# Patient Record
Sex: Female | Born: 1975 | Race: White | Hispanic: No | Marital: Married | Smoking: Never smoker
Health system: Southern US, Community
[De-identification: ages and names within clinical notes are randomized; demographics above are authoritative.]

## PROBLEM LIST (undated history)

## (undated) DIAGNOSIS — M1990 Unspecified osteoarthritis, unspecified site: Secondary | ICD-10-CM

## (undated) DIAGNOSIS — N3020 Other chronic cystitis without hematuria: Secondary | ICD-10-CM

## (undated) DIAGNOSIS — D649 Anemia, unspecified: Secondary | ICD-10-CM

## (undated) DIAGNOSIS — J42 Unspecified chronic bronchitis: Secondary | ICD-10-CM

## (undated) DIAGNOSIS — Z87440 Personal history of urinary (tract) infections: Secondary | ICD-10-CM

## (undated) DIAGNOSIS — T7840XA Allergy, unspecified, initial encounter: Secondary | ICD-10-CM

## (undated) DIAGNOSIS — R87619 Unspecified abnormal cytological findings in specimens from cervix uteri: Secondary | ICD-10-CM

## (undated) DIAGNOSIS — N200 Calculus of kidney: Secondary | ICD-10-CM

## (undated) DIAGNOSIS — Z87442 Personal history of urinary calculi: Secondary | ICD-10-CM

## (undated) DIAGNOSIS — J301 Allergic rhinitis due to pollen: Secondary | ICD-10-CM

## (undated) DIAGNOSIS — J45909 Unspecified asthma, uncomplicated: Secondary | ICD-10-CM

## (undated) DIAGNOSIS — F419 Anxiety disorder, unspecified: Secondary | ICD-10-CM

## (undated) DIAGNOSIS — F329 Major depressive disorder, single episode, unspecified: Secondary | ICD-10-CM

## (undated) DIAGNOSIS — J349 Unspecified disorder of nose and nasal sinuses: Secondary | ICD-10-CM

## (undated) DIAGNOSIS — I10 Essential (primary) hypertension: Secondary | ICD-10-CM

## (undated) DIAGNOSIS — R0609 Other forms of dyspnea: Secondary | ICD-10-CM

## (undated) DIAGNOSIS — Z8619 Personal history of other infectious and parasitic diseases: Secondary | ICD-10-CM

## (undated) DIAGNOSIS — F32A Depression, unspecified: Secondary | ICD-10-CM

## (undated) DIAGNOSIS — E7800 Pure hypercholesterolemia, unspecified: Secondary | ICD-10-CM

## (undated) DIAGNOSIS — R0600 Dyspnea, unspecified: Secondary | ICD-10-CM

## (undated) DIAGNOSIS — N941 Unspecified dyspareunia: Secondary | ICD-10-CM

## (undated) DIAGNOSIS — Z8709 Personal history of other diseases of the respiratory system: Secondary | ICD-10-CM

## (undated) HISTORY — DX: Unspecified abnormal cytological findings in specimens from cervix uteri: R87.619

## (undated) HISTORY — DX: Allergy, unspecified, initial encounter: T78.40XA

## (undated) HISTORY — DX: Allergic rhinitis due to pollen: J30.1

## (undated) HISTORY — DX: Pure hypercholesterolemia, unspecified: E78.00

## (undated) HISTORY — PX: SEPTOPLASTY: SUR1290

## (undated) HISTORY — DX: Major depressive disorder, single episode, unspecified: F32.9

## (undated) HISTORY — DX: Essential (primary) hypertension: I10

## (undated) HISTORY — DX: Depression, unspecified: F32.A

## (undated) HISTORY — DX: Unspecified disorder of nose and nasal sinuses: J34.9

---
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
## 1993-05-30 HISTORY — PX: KNEE ARTHROSCOPY: SHX127

## 1998-04-20 ENCOUNTER — Other Ambulatory Visit: Admission: RE | Admit: 1998-04-20 | Discharge: 1998-04-20 | Payer: Self-pay | Admitting: *Deleted

## 1999-02-24 ENCOUNTER — Other Ambulatory Visit: Admission: RE | Admit: 1999-02-24 | Discharge: 1999-02-24 | Payer: Self-pay | Admitting: *Deleted

## 2000-02-24 ENCOUNTER — Other Ambulatory Visit: Admission: RE | Admit: 2000-02-24 | Discharge: 2000-02-24 | Payer: Self-pay | Admitting: *Deleted

## 2001-03-06 ENCOUNTER — Other Ambulatory Visit: Admission: RE | Admit: 2001-03-06 | Discharge: 2001-03-06 | Payer: Self-pay | Admitting: *Deleted

## 2002-04-30 ENCOUNTER — Other Ambulatory Visit: Admission: RE | Admit: 2002-04-30 | Discharge: 2002-04-30 | Payer: Self-pay | Admitting: Obstetrics and Gynecology

## 2002-11-20 ENCOUNTER — Inpatient Hospital Stay (HOSPITAL_COMMUNITY): Admission: RE | Admit: 2002-11-20 | Discharge: 2002-11-23 | Payer: Self-pay | Admitting: Obstetrics and Gynecology

## 2002-11-24 ENCOUNTER — Encounter: Admission: RE | Admit: 2002-11-24 | Discharge: 2002-12-24 | Payer: Self-pay | Admitting: Obstetrics and Gynecology

## 2003-04-22 ENCOUNTER — Other Ambulatory Visit: Admission: RE | Admit: 2003-04-22 | Discharge: 2003-04-22 | Payer: Self-pay | Admitting: *Deleted

## 2004-04-14 ENCOUNTER — Ambulatory Visit: Payer: Self-pay | Admitting: Family Medicine

## 2004-10-27 ENCOUNTER — Ambulatory Visit: Payer: Self-pay | Admitting: Internal Medicine

## 2004-12-29 ENCOUNTER — Ambulatory Visit: Payer: Self-pay | Admitting: Family Medicine

## 2005-02-21 ENCOUNTER — Ambulatory Visit: Payer: Self-pay | Admitting: Family Medicine

## 2005-03-07 ENCOUNTER — Other Ambulatory Visit: Admission: RE | Admit: 2005-03-07 | Discharge: 2005-03-07 | Payer: Self-pay | Admitting: *Deleted

## 2005-04-01 ENCOUNTER — Ambulatory Visit: Payer: Self-pay | Admitting: Family Medicine

## 2005-07-18 ENCOUNTER — Ambulatory Visit (HOSPITAL_COMMUNITY): Admission: RE | Admit: 2005-07-18 | Discharge: 2005-07-18 | Payer: Self-pay | Admitting: Orthopedic Surgery

## 2006-03-15 ENCOUNTER — Other Ambulatory Visit: Admission: RE | Admit: 2006-03-15 | Discharge: 2006-03-15 | Payer: Self-pay | Admitting: *Deleted

## 2006-05-01 ENCOUNTER — Ambulatory Visit: Payer: Self-pay | Admitting: Family Medicine

## 2006-05-19 ENCOUNTER — Ambulatory Visit: Payer: Self-pay | Admitting: Family Medicine

## 2006-06-01 ENCOUNTER — Ambulatory Visit: Payer: Self-pay | Admitting: Allergy and Immunology

## 2006-06-18 ENCOUNTER — Emergency Department (HOSPITAL_COMMUNITY): Admission: EM | Admit: 2006-06-18 | Discharge: 2006-06-18 | Payer: Self-pay | Admitting: Emergency Medicine

## 2006-08-24 ENCOUNTER — Ambulatory Visit: Payer: Self-pay | Admitting: Family Medicine

## 2007-01-20 ENCOUNTER — Emergency Department (HOSPITAL_COMMUNITY): Admission: EM | Admit: 2007-01-20 | Discharge: 2007-01-20 | Payer: Self-pay | Admitting: Emergency Medicine

## 2007-03-14 ENCOUNTER — Other Ambulatory Visit: Admission: RE | Admit: 2007-03-14 | Discharge: 2007-03-14 | Payer: Self-pay | Admitting: *Deleted

## 2007-03-16 ENCOUNTER — Ambulatory Visit: Payer: Self-pay | Admitting: Internal Medicine

## 2007-07-09 ENCOUNTER — Encounter (INDEPENDENT_AMBULATORY_CARE_PROVIDER_SITE_OTHER): Payer: Self-pay | Admitting: Internal Medicine

## 2008-05-26 ENCOUNTER — Other Ambulatory Visit: Admission: RE | Admit: 2008-05-26 | Discharge: 2008-05-26 | Payer: Self-pay | Admitting: Gynecology

## 2009-03-16 ENCOUNTER — Ambulatory Visit: Payer: Self-pay | Admitting: Family Medicine

## 2009-03-16 DIAGNOSIS — J209 Acute bronchitis, unspecified: Secondary | ICD-10-CM

## 2009-03-31 ENCOUNTER — Emergency Department (HOSPITAL_COMMUNITY): Admission: EM | Admit: 2009-03-31 | Discharge: 2009-03-31 | Payer: Self-pay | Admitting: Family Medicine

## 2009-05-19 ENCOUNTER — Ambulatory Visit: Payer: Self-pay | Admitting: Family Medicine

## 2009-05-19 DIAGNOSIS — J069 Acute upper respiratory infection, unspecified: Secondary | ICD-10-CM | POA: Insufficient documentation

## 2009-07-06 ENCOUNTER — Ambulatory Visit: Payer: Self-pay | Admitting: Family Medicine

## 2009-07-06 DIAGNOSIS — J309 Allergic rhinitis, unspecified: Secondary | ICD-10-CM

## 2009-07-06 DIAGNOSIS — J019 Acute sinusitis, unspecified: Secondary | ICD-10-CM

## 2009-07-15 ENCOUNTER — Ambulatory Visit: Payer: Self-pay | Admitting: Family Medicine

## 2009-07-15 DIAGNOSIS — F4323 Adjustment disorder with mixed anxiety and depressed mood: Secondary | ICD-10-CM

## 2009-07-16 ENCOUNTER — Telehealth: Payer: Self-pay | Admitting: Family Medicine

## 2009-07-17 ENCOUNTER — Telehealth: Payer: Self-pay | Admitting: Family Medicine

## 2009-07-27 ENCOUNTER — Telehealth: Payer: Self-pay | Admitting: Family Medicine

## 2009-08-12 ENCOUNTER — Ambulatory Visit: Payer: Self-pay | Admitting: Family Medicine

## 2009-08-18 ENCOUNTER — Ambulatory Visit: Payer: Self-pay | Admitting: Family Medicine

## 2009-08-24 ENCOUNTER — Ambulatory Visit: Payer: Self-pay | Admitting: Family Medicine

## 2009-08-27 ENCOUNTER — Ambulatory Visit: Payer: Self-pay | Admitting: Family Medicine

## 2009-08-27 DIAGNOSIS — J029 Acute pharyngitis, unspecified: Secondary | ICD-10-CM

## 2009-08-27 LAB — CONVERTED CEMR LAB: Rapid Strep: NEGATIVE

## 2009-09-01 ENCOUNTER — Telehealth: Payer: Self-pay | Admitting: Family Medicine

## 2009-09-08 ENCOUNTER — Ambulatory Visit: Payer: Self-pay | Admitting: Family Medicine

## 2009-09-09 ENCOUNTER — Telehealth: Payer: Self-pay | Admitting: Family Medicine

## 2009-09-23 ENCOUNTER — Ambulatory Visit: Payer: Self-pay | Admitting: Family Medicine

## 2009-09-23 DIAGNOSIS — T148XXA Other injury of unspecified body region, initial encounter: Secondary | ICD-10-CM | POA: Insufficient documentation

## 2009-09-23 DIAGNOSIS — K219 Gastro-esophageal reflux disease without esophagitis: Secondary | ICD-10-CM | POA: Insufficient documentation

## 2009-09-23 LAB — CONVERTED CEMR LAB
Basophils Absolute: 0 10*3/uL (ref 0.0–0.1)
Eosinophils Absolute: 0.1 10*3/uL (ref 0.0–0.7)
Eosinophils Relative: 1 % (ref 0.0–5.0)
Lymphocytes Relative: 24.4 % (ref 12.0–46.0)
Lymphs Abs: 1.6 10*3/uL (ref 0.7–4.0)
MCHC: 35.3 g/dL (ref 30.0–36.0)
Monocytes Relative: 8 % (ref 3.0–12.0)
RBC: 4.22 M/uL (ref 3.87–5.11)

## 2009-10-27 ENCOUNTER — Encounter: Admission: RE | Admit: 2009-10-27 | Discharge: 2009-10-27 | Payer: Self-pay | Admitting: Pulmonary Disease

## 2010-01-08 ENCOUNTER — Ambulatory Visit: Payer: Self-pay | Admitting: Family Medicine

## 2010-01-11 LAB — CONVERTED CEMR LAB
Basophils Absolute: 0 10*3/uL (ref 0.0–0.1)
Bilirubin, Direct: 0.1 mg/dL (ref 0.0–0.3)
Eosinophils Absolute: 0 10*3/uL (ref 0.0–0.7)
Hemoglobin: 13.5 g/dL (ref 12.0–15.0)
Lymphocytes Relative: 27.4 % (ref 12.0–46.0)
MCHC: 34.9 g/dL (ref 30.0–36.0)
MCV: 90 fL (ref 78.0–100.0)
Neutrophils Relative %: 63.9 % (ref 43.0–77.0)
Platelets: 250 10*3/uL (ref 150.0–400.0)
Saturation Ratios: 47.7 % (ref 20.0–50.0)
Total Bilirubin: 0.7 mg/dL (ref 0.3–1.2)
WBC: 5.9 10*3/uL (ref 4.5–10.5)

## 2010-06-29 NOTE — Assessment & Plan Note (Signed)
Summary: 1 M F/U DLO   Vital Signs:  Patient profile:   35 year old female Height:      66 inches Weight:      130 pounds BMI:     21.06 Temp:     99.1 degrees F oral Pulse rate:   92 / minute Pulse rhythm:   regular BP sitting:   142 / 94  (left arm) Cuff size:   regular  Vitals Entered By: Delilah Shan CMA Duncan Dull) (September 08, 2009 9:42 AM) CC: 1 month follow up   History of Present Illness: 35 yo here for follow up depression.   Last month,  I saw her for worsening depression related to her stressors at work.  We increased her Wellbutrin to 300 mg daily from 150 mg daily.  Since then she started seeing a psychiatrist, Dr. Cardell Peach.  She like him very much. He switched her to 300 mg XL to decrease side effects of jitteriness, yawning, had nausea and diarrhea which has helped  Depressive symptoms are much improved.   No SI or HI. Husband very supportive.  She is planning on returning to work on May 2nd. Working on strategies for when she returns to work to minimize stressors with her psychiatrist. She is actually looking forward to returning to work.  Current Medications (verified): 1)  Advair Diskus 100-50 Mcg/dose Aepb (Fluticasone-Salmeterol) .... Inhale One Puff in The Morning and Inhale One Puff in The Evening 2)  Nasacort Aq 55 Mcg/act Aers (Triamcinolone Acetonide(Nasal)) .... Two Puffs Once Daily 3)  Astepro 0.15 % Soln (Azelastine Hcl) .... Two Puffs Once Daily 4)  Zyrtec Allergy 10 Mg Tabs (Cetirizine Hcl) .... Take One Tablet Daily 5)  Multivitamins   Tabs (Multiple Vitamin) .... Takes One Daily 6)  Chewable Calcium 500-200-40 Mg-Unt-Mcg Chew (Calcium-Vitamin D-Vitamin K) .... Otc As Directed. 7)  Chewable Vitamin C 250 Mg Chew (Ascorbic Acid) .... Otc As Directed. 8)  Ambien Cr 6.25 Mg  Tbcr (Zolpidem Tartrate) .Marland Kitchen.. 1 By Mouth At [facility] Needed 9)  Wellbutrin Xl 300 Mg Xr24h-Tab (Bupropion Hcl) .... Take 1 Tablet By Mouth Once A Day  Allergies: 1)  !  Amoxicillin 2)  ! Codeine 3)  ! Erythromycin  Review of Systems      See HPI Psych:  Denies sense of great danger, suicidal thoughts/plans, thoughts of violence, unusual visions or sounds, and thoughts /plans of harming others.  Physical Exam  General:  alert, NAD. Psych:  Oriented X3, good eye contact, and depressed affect.looks great!!   Impression & Recommendations:  Problem # 1:  ADJ DISORDER WITH MIXED ANXIETY & DEPRESSED MOOD (ICD-309.28) Assessment Improved Time spent with patient 25 minutes, more than 50% of this time was spent discussing her depression and anxiety. She is doing much better.  Continue Wellbutrin at current dose and continue therapy with her psychiatrist.  She is going to transfer medication management to him.  Complete Medication List: 1)  Advair Diskus 100-50 Mcg/dose Aepb (Fluticasone-salmeterol) .... Inhale one puff in the morning and inhale one puff in the evening 2)  Nasacort Aq 55 Mcg/act Aers (Triamcinolone acetonide(nasal)) .... Two puffs once daily 3)  Astepro 0.15 % Soln (Azelastine hcl) .... Two puffs once daily 4)  Zyrtec Allergy 10 Mg Tabs (Cetirizine hcl) .... Take one tablet daily 5)  Multivitamins Tabs (Multiple vitamin) .... Takes one daily 6)  Chewable Calcium 500-200-40 Mg-unt-mcg Chew (Calcium-vitamin d-vitamin k) .... Otc as directed. 7)  Chewable Vitamin C 250 Mg Chew (  Ascorbic acid) .... Otc as directed. 8)  Ambien Cr 6.25 Mg Tbcr (Zolpidem tartrate) .Marland Kitchen.. 1 by mouth at bedtimeas needed 9)  Wellbutrin Xl 300 Mg Xr24h-tab (Bupropion hcl) .... Take 1 tablet by mouth once a day Prescriptions: AMBIEN CR 6.25 MG  TBCR (ZOLPIDEM TARTRATE) 1 by mouth at bedtimeas needed  #30 x 0   Entered and Authorized by:   Ruthe Mannan MD   Signed by:   Ruthe Mannan MD on 09/08/2009   Method used:   Print then Give to Patient   RxID:   [id number]   Current Allergies (reviewed today): ! AMOXICILLIN ! CODEINE ! ERYTHROMYCIN

## 2010-06-29 NOTE — Assessment & Plan Note (Signed)
Summary: ST/CLE   Vital Signs:  Patient profile:   35 year old female Height:      66 inches Weight:      130.38 pounds BMI:     21.12 Temp:     98.9 degrees F oral Pulse rate:   96 / minute Pulse rhythm:   regular BP sitting:   132 / 84  (left arm) Cuff size:   regular  Vitals Entered By: Delilah Shan CMA Duncan Dull) (August 27, 2009 10:44 AM) CC: ST   History of Present Illness: 35 yo with 1 day of sore throat, right>left. Also has runny nose, cough, sinus pressure. No fevers or chills. No n/v/d. No rashes.  Son diagnosed with strep throat last week.  Current Medications (verified): 1)  Advair Diskus 100-50 Mcg/dose Aepb (Fluticasone-Salmeterol) .... Inhale One Puff in The Morning and Inhale One Puff in The Evening 2)  Nasacort Aq 55 Mcg/act Aers (Triamcinolone Acetonide(Nasal)) .... Two Puffs Once Daily 3)  Astepro 0.15 % Soln (Azelastine Hcl) .... Two Puffs Once Daily 4)  Zyrtec Allergy 10 Mg Tabs (Cetirizine Hcl) .... Take One Tablet Daily 5)  Multivitamins   Tabs (Multiple Vitamin) .... Takes One Daily 6)  Chewable Calcium 500-200-40 Mg-Unt-Mcg Chew (Calcium-Vitamin D-Vitamin K) .... Otc As Directed. 7)  Chewable Vitamin C 250 Mg Chew (Ascorbic Acid) .... Otc As Directed. 8)  Ambien Cr 6.25 Mg  Tbcr (Zolpidem Tartrate) .Marland Kitchen.. 1 By Mouth At [facility] Needed 9)  Wellbutrin Sr 150 Mg Xr12h-Tab (Bupropion Hcl) .... 2 Tab By Mouth Every Morning.  Allergies: 1)  ! Amoxicillin 2)  ! Codeine 3)  ! Erythromycin  Review of Systems      See HPI General:  Denies chills and fever. ENT:  Complains of nasal congestion, sinus pressure, and sore throat; denies difficulty swallowing. Resp:  Complains of cough; denies shortness of breath, sputum productive, and wheezing. GI:  Denies abdominal pain, diarrhea, nausea, and vomiting.  Physical Exam  General:  alert, NAD. Ears:  R ear normal and L ear normal.   Nose:  mucosal erythema.   Mouth:  pharyngeal erythema.   no exudates,  mild tonsillar hypertrophy. Lungs:  Normal respiratory effort, chest expands symmetrically. Lungs are clear to auscultation, no crackles or wheezes. Heart:  Normal rate and regular rhythm. S1 and S2 normal without gallop, murmur, click, rub or other extra sounds. Psych:  Oriented X3, good eye contact, and depressed affect. less tearful.   Impression & Recommendations:  Problem # 1:  SORE THROAT (ICD-462) Assessment New Consistent with viral URI given other symptoms and physical exam findings.  Rapid strep neg, will not send for culture because not characteristic for strep.  Advised Ibuprofen for pain.  See pt instructions. Orders: Rapid Strep ([id number])  Complete Medication List: 1)  Advair Diskus 100-50 Mcg/dose Aepb (Fluticasone-salmeterol) .... Inhale one puff in the morning and inhale one puff in the evening 2)  Nasacort Aq 55 Mcg/act Aers (Triamcinolone acetonide(nasal)) .... Two puffs once daily 3)  Astepro 0.15 % Soln (Azelastine hcl) .... Two puffs once daily 4)  Zyrtec Allergy 10 Mg Tabs (Cetirizine hcl) .... Take one tablet daily 5)  Multivitamins Tabs (Multiple vitamin) .... Takes one daily 6)  Chewable Calcium 500-200-40 Mg-unt-mcg Chew (Calcium-vitamin d-vitamin k) .... Otc as directed. 7)  Chewable Vitamin C 250 Mg Chew (Ascorbic acid) .... Otc as directed. 8)  Ambien Cr 6.25 Mg Tbcr (Zolpidem tartrate) .Marland Kitchen.. 1 by mouth at bedtimeas needed 9)  Wellbutrin Sr 150 Mg Xr12h-tab (  Bupropion hcl) .... 2 tab by mouth every morning.  Patient Instructions: 1)  Take 400-600 mg of Ibuprofen (Advil, Motrin) with food every 4-6 hours as needed  for relief of pain or comfort of fever.   Current Allergies (reviewed today): ! AMOXICILLIN ! CODEINE ! ERYTHROMYCIN  Laboratory Results   Date/Time Reported: August 27, 2009 10:51 AM   Other Tests  Rapid Strep: negative

## 2010-06-29 NOTE — Assessment & Plan Note (Signed)
Summary: ?REACTION TO NEW MEDICATION/CLE   Vital Signs:  Patient profile:   35 year old female Height:      66 inches Weight:      130.25 pounds BMI:     21.10 Temp:     99.1 degrees F oral Pulse rate:   84 / minute Pulse rhythm:   regular BP sitting:   122 / 88  (left arm) Cuff size:   regular  Vitals Entered By: Delilah Shan CMA Duncan Dull) (August 18, 2009 9:42 AM) CC: ? reaction to new medication    History of Present Illness: 35 yo here for ?side effects to Wellbutrin.  6 days ago, I saw her for worsening depression related to her stressors at work.  We increased her Wellbutrin to 300 mg daily from 150 mg daily. Since then has been jittery, yawning, had nausea and diarrhea. Had a fleeting chest discomfort a few days ago which lasted seconds and resolved, she felt it was like a palpitation. No vomiting. Depressive symptoms are about the same, less crying spells since we started Wellbutrin at lower dose.   No SI or HI. Husband very supportive. Still seeing therapist.    Current Medications (verified): 1)  Advair Diskus 100-50 Mcg/dose Aepb (Fluticasone-Salmeterol) .... Inhale One Puff in The Morning and Inhale One Puff in The Evening 2)  Nasacort Aq 55 Mcg/act Aers (Triamcinolone Acetonide(Nasal)) .... Two Puffs Once Daily 3)  Astepro 0.15 % Soln (Azelastine Hcl) .... Two Puffs Once Daily 4)  Zyrtec Allergy 10 Mg Tabs (Cetirizine Hcl) .... Take One Tablet Daily 5)  Multivitamins   Tabs (Multiple Vitamin) .... Takes One Daily 6)  Chewable Calcium 500-200-40 Mg-Unt-Mcg Chew (Calcium-Vitamin D-Vitamin K) .... Otc As Directed. 7)  Chewable Vitamin C 250 Mg Chew (Ascorbic Acid) .... Otc As Directed. 8)  Ambien Cr 6.25 Mg  Tbcr (Zolpidem Tartrate) .Marland Kitchen.. 1 By Mouth At [facility] Needed 9)  Wellbutrin Sr 150 Mg Xr12h-Tab (Bupropion Hcl) .... 2 Tab By Mouth Every Morning.  Allergies: 1)  ! Amoxicillin 2)  ! Codeine 3)  ! Erythromycin  Review of Systems      See HPI General:   Denies fever. GI:  Complains of diarrhea and nausea; denies vomiting. Psych:  Complains of depression; denies anxiety, easily angered, easily tearful, suicidal thoughts/plans, thoughts of violence, unusual visions or sounds, and thoughts /plans of harming others.  Physical Exam  General:  alert, less tearful. Lungs:  good effort,diffuse exp wheezes, no crackles. Heart:  Normal rate and regular rhythm. S1 and S2 normal without gallop, murmur, click, rub or other extra sounds. Psych:  Oriented X3, good eye contact, and depressed affect. less tearful.   Impression & Recommendations:  Problem # 1:  ADJ DISORDER WITH MIXED ANXIETY & DEPRESSED MOOD (ICD-309.28) Assessment Unchanged Time spent with patient 25 minutes, more than 50% of this time was spent counseling patient on side effects of Wellbutrin. Printed out handout from up to date showing her the possible side effects. Reassurance provided, did tell her to go immediately to ER if she does feel any cardiac or other worrisome symptoms. Pt expressed understanding.  She wants to give the higher dose another week before backing off in hopes that the side effects will wear off.  She will call me in 1-2 weeks.  Complete Medication List: 1)  Advair Diskus 100-50 Mcg/dose Aepb (Fluticasone-salmeterol) .... Inhale one puff in the morning and inhale one puff in the evening 2)  Nasacort Aq 55 Mcg/act Aers (Triamcinolone acetonide(nasal)) .Marland KitchenMarland KitchenMarland Kitchen  Two puffs once daily 3)  Astepro 0.15 % Soln (Azelastine hcl) .... Two puffs once daily 4)  Zyrtec Allergy 10 Mg Tabs (Cetirizine hcl) .... Take one tablet daily 5)  Multivitamins Tabs (Multiple vitamin) .... Takes one daily 6)  Chewable Calcium 500-200-40 Mg-unt-mcg Chew (Calcium-vitamin d-vitamin k) .... Otc as directed. 7)  Chewable Vitamin C 250 Mg Chew (Ascorbic acid) .... Otc as directed. 8)  Ambien Cr 6.25 Mg Tbcr (Zolpidem tartrate) .Marland Kitchen.. 1 by mouth at bedtimeas needed 9)  Wellbutrin Sr 150 Mg  Xr12h-tab (Bupropion hcl) .... 2 tab by mouth every morning.  Current Allergies (reviewed today): ! AMOXICILLIN ! CODEINE ! ERYTHROMYCIN

## 2010-06-29 NOTE — Assessment & Plan Note (Signed)
Summary: 30 MIN/DEPRESSION/BILLIE'S PT/CLE   Vital Signs:  Patient profile:   35 year old female Height:      66 inches Weight:      135.50 pounds BMI:     21.95 Temp:     98.7 degrees F oral Pulse rate:   88 / minute Pulse rhythm:   regular BP sitting:   142 / 90  (left arm) Cuff size:   regular  Vitals Entered By: Delilah Shan CMA (AAMA) (July 15, 2009 10:07 AM) CC: 30 min (BDB)  Depression   History of Present Illness: 35 yo here for worsening depression.  Had two episodes of depression in past, one post partum and once in college. Both were situational and improved with antidepressants and therapy.  Had been doing very well until recently.  Was promoted at work months ago, since then added pressure.  Stays up late and night worrying about her job. Difficulty falling and staying asleep.  Over last 3 weeks, cannot stop crying. Difficulty concentrating at work.  Has two children, feels like she has no energy to spend time with them. Loss of sexual interest.  Feels more irritable. No SI or HI.  Started seeing a Haematologist, [employer] manager yesterday.  Referred to him through her employer.  Likes him a lot, feels it will help her to continue therapy.  Current Medications (verified): 1)  Advair Diskus 100-50 Mcg/dose Aepb (Fluticasone-Salmeterol) .... Inhale One Puff in The Morning and Inhale One Puff in The Evening 2)  Nasacort Aq 55 Mcg/act Aers (Triamcinolone Acetonide(Nasal)) .... Two Puffs Once Daily 3)  Astepro 0.15 % Soln (Azelastine Hcl) .... Two Puffs Once Daily 4)  Zyrtec Allergy 10 Mg Tabs (Cetirizine Hcl) .... Take One Tablet Daily 5)  Multivitamins   Tabs (Multiple Vitamin) .... Takes One Daily 6)  Chewable Calcium 500-200-40 Mg-Unt-Mcg Chew (Calcium-Vitamin D-Vitamin K) .... Otc As Directed. 7)  Chewable Vitamin C 250 Mg Chew (Ascorbic Acid) .... Otc As Directed. 8)  Trazodone Hcl 50 Mg Tabs (Trazodone Hcl) .Marland Kitchen.. 1 By Mouth At Bedtime As Needed Insomnia 9)   Wellbutrin Xl 150 Mg Xr24h-Tab (Bupropion Hcl) .Marland Kitchen.. 1 Tab By Mouth Daily.  Allergies: 1)  ! Amoxicillin 2)  ! Codeine 3)  ! Erythromycin  Review of Systems      See HPI Psych:  Complains of anxiety, depression, easily tearful, and irritability; denies easily angered, mental problems, panic attacks, sense of great danger, suicidal thoughts/plans, thoughts of violence, unusual visions or sounds, and thoughts /plans of harming others.  Physical Exam  General:  alert, very tearful. Psych:  Oriented X3, good eye contact, and depressed affect. very tearful.     Impression & Recommendations:  Problem # 1:  ADJ DISORDER WITH MIXED ANXIETY & DEPRESSED MOOD (ICD-309.28) Assessment New Time spent with patient 25 minutes, more than 50% of this time was spent counseling patient on depressive mood. Mainly depressed mood.  Will try Wellbutrin 150 mg, continue with counselling.  Low dose trazadone as needed insomnia.  Follow up with me in 1 month.  Complete Medication List: 1)  Advair Diskus 100-50 Mcg/dose Aepb (Fluticasone-salmeterol) .... Inhale one puff in the morning and inhale one puff in the evening 2)  Nasacort Aq 55 Mcg/act Aers (Triamcinolone acetonide(nasal)) .... Two puffs once daily 3)  Astepro 0.15 % Soln (Azelastine hcl) .... Two puffs once daily 4)  Zyrtec Allergy 10 Mg Tabs (Cetirizine hcl) .... Take one tablet daily 5)  Multivitamins Tabs (Multiple vitamin) .... Takes one daily  6)  Chewable Calcium 500-200-40 Mg-unt-mcg Chew (Calcium-vitamin d-vitamin k) .... Otc as directed. 7)  Chewable Vitamin C 250 Mg Chew (Ascorbic acid) .... Otc as directed. 8)  Trazodone Hcl 50 Mg Tabs (Trazodone hcl) .Marland Kitchen.. 1 by mouth at bedtime as needed insomnia 9)  Wellbutrin Xl 150 Mg Xr24h-tab (Bupropion hcl) .Marland Kitchen.. 1 tab by mouth daily.  Patient Instructions: 1)  Please come back to see me in one month. 2)  If you have any thoughts of harming yourself or other please call us or 911  immediately. Prescriptions: WELLBUTRIN XL 150 MG XR24H-TAB (BUPROPION HCL) 1 tab by mouth daily.  #30 x 0   Entered and Authorized by:   Ruthe Mannan MD   Signed by:   Ruthe Mannan MD on 07/15/2009   Method used:   Electronically to        [facility]* (retail)       [address]       [place], [place]  [address]       Ph: [phone]       Fax: [phone]   RxID:   [id number] TRAZODONE HCL 50 MG TABS (TRAZODONE HCL) 1 by mouth at bedtime as needed insomnia  #30 x 0   Entered and Authorized by:   Ruthe Mannan MD   Signed by:   Ruthe Mannan MD on 07/15/2009   Method used:   Electronically to        [facility]* (retail)       [address]       [place], [place]  [address]       Ph: [phone]       Fax: [phone]   RxID:   [id number]   Current Allergies (reviewed today): ! AMOXICILLIN ! CODEINE ! ERYTHROMYCIN

## 2010-06-29 NOTE — Assessment & Plan Note (Signed)
Summary: F/U BRUISING/CLE   Vital Signs:  Patient profile:   35 year old female Height:      66 inches Weight:      126.13 pounds BMI:     20.43 Temp:     99.6 degrees F oral Pulse rate:   72 / minute Pulse rhythm:   regular BP sitting:   120 / 90  (left arm) Cuff size:   regular  Vitals Entered By: Linde Gillis CMA Duncan Dull) (January 08, 2010 11:53 AM) CC: follow-up visit bruising   History of Present Illness: 35 yo here for follow up brusing.  Saw pt in April for same complaint. CBC at that time was normal. Says she has always been clumpsy but still getting multiple bruses on legs and arms, wakes up with them and does not remember hitting anything.  Never palpable, tender or raised. No nose bleeds, blood in stool, blood in urine. Has IUD and periods are not heavy.  No fevers ,chills, malaise, or weight loss. No new medications other then Wellbutrin which was started several months ago.  Denies sleep walking or domestic violence.  Current Medications (verified): 1)  Advair Diskus 100-50 Mcg/dose Aepb (Fluticasone-Salmeterol) .... Inhale One Puff in The Morning and Inhale One Puff in The Evening 2)  Nasacort Aq 55 Mcg/act Aers (Triamcinolone Acetonide(Nasal)) .... Two Puffs Once Daily 3)  Astepro 0.15 % Soln (Azelastine Hcl) .... Two Puffs Once Daily 4)  Zyrtec Allergy 10 Mg Tabs (Cetirizine Hcl) .... Take One Tablet Daily 5)  Multivitamins   Tabs (Multiple Vitamin) .... Takes One Daily 6)  Chewable Calcium 500-200-40 Mg-Unt-Mcg Chew (Calcium-Vitamin D-Vitamin K) .... Otc As Directed. 7)  Chewable Vitamin C 250 Mg Chew (Ascorbic Acid) .... Otc As Directed. 8)  Ambien Cr 6.25 Mg  Tbcr (Zolpidem Tartrate) .Marland Kitchen.. 1 By Mouth At [facility] Needed 9)  Wellbutrin Xl 300 Mg Xr24h-Tab (Bupropion Hcl) .... Take 1 Tablet By Mouth Once A Day 10)  Nexium 40 Mg Cpdr (Esomeprazole Magnesium) .... Take 1 Tab Each Morning  Allergies: 1)  ! Amoxicillin 2)  ! Codeine 3)  !  Erythromycin  Past History:  Past Medical History: Last updated: 07/06/2009 Allergic rhinitis Chronic sinus problems  Social History: Last updated: 07/06/2009 Mardelle Matte Brotzman's wife Micah Flesher to [facility]  Review of Systems      See HPI General:  Denies fever, malaise, and sweats. Heme:  Complains of abnormal bruising; denies bleeding, enlarge lymph nodes, fevers, pallor, and skin discoloration.  Physical Exam  General:  alert, NAD. Skin:  small healing echymosis on left forearm, larger healing echymosis on back of left calf., another small healing echymosis on left shin. Cervical Nodes:  No lymphadenopathy noted Axillary Nodes:  No palpable lymphadenopathy Psych:  Oriented X3, good eye contact, and depressed affect.looks great!!   Impression & Recommendations:  Problem # 1:  CONTUSION (ICD-924.9) Assessment Deteriorated Will recheck labs again today.  LIkely benign and idiopathic. If all normal again, will call hematology for assistance on how to proceed. Orders: Venipuncture ([id number]) TLB-CBC Platelet - w/Differential ([id number]) TLB-IBC Pnl (Iron/FE;Transferrin) ([id number]) TLB-Hepatic/Liver Function Pnl ([id number])  Complete Medication List: 1)  Advair Diskus 100-50 Mcg/dose Aepb (Fluticasone-salmeterol) .... Inhale one puff in the morning and inhale one puff in the evening 2)  Nasacort Aq 55 Mcg/act Aers (Triamcinolone acetonide(nasal)) .... Two puffs once daily 3)  Astepro 0.15 % Soln (Azelastine hcl) .... Two puffs once daily 4)  Zyrtec Allergy 10 Mg Tabs (Cetirizine hcl) .... Take one tablet daily 5)  Multivitamins Tabs (Multiple vitamin) .... Takes one daily 6)  Chewable Calcium 500-200-40 Mg-unt-mcg Chew (Calcium-vitamin d-vitamin k) .... Otc as directed. 7)  Chewable Vitamin C 250 Mg Chew (Ascorbic acid) .... Otc as directed. 8)  Ambien Cr 6.25 Mg Tbcr (Zolpidem tartrate) .Marland Kitchen.. 1 by mouth at bedtimeas needed 9)  Wellbutrin Xl 300 Mg Xr24h-tab (Bupropion hcl)  .... Take 1 tablet by mouth once a day 10)  Nexium 40 Mg Cpdr (Esomeprazole magnesium) .... Take 1 tab each morning  Current Allergies (reviewed today): ! AMOXICILLIN ! CODEINE ! ERYTHROMYCIN

## 2010-06-29 NOTE — Progress Notes (Signed)
Summary: Rx Ambien  Phone Note Call from Patient   Caller: Patient Call For: Hannah Beat MD Summary of Call: Patient stated she was told to call our office today to let us know how she slept last night using Ambien.  She said she slept well and used half of the 12.5 mg tablet.  Wants to know if she could have a lower dose so she wouldn't have to cut pill in half but would like a Rx sent in the the pharmacy.   Initial call taken by: Linde Gillis CMA Duncan Dull),  July 17, 2009 2:28 PM  Follow-up for Phone Call        Medication phoned to pharmacy.  Follow-up by: Delilah Shan CMA Duncan Dull),  July 17, 2009 3:38 PM    New/Updated Medications: AMBIEN CR 6.25 MG  TBCR (ZOLPIDEM TARTRATE) 1 by mouth at bedtimeas needed Prescriptions: AMBIEN CR 6.25 MG  TBCR (ZOLPIDEM TARTRATE) 1 by mouth at bedtimeas needed  #30 x 0   Entered and Authorized by:   Ruthe Mannan MD   Signed by:   Delilah Shan CMA (AAMA) on 07/17/2009   Method used:   Handwritten   RxID:   [id number]

## 2010-06-29 NOTE — Progress Notes (Signed)
Summary: Office visit notes  Phone Note From Other Clinic Call back at ph [phone] fax [phone]   Caller: [employer] Call For: Dr. Dayton Martes Summary of Call: Needs office notes from 08/12/2009, labs, and any other information.  Says that the disability form was incomplete and she needs this information right away. Initial call taken by: Linde Gillis CMA Duncan Dull),  September 09, 2009 4:15 PM  Follow-up for Phone Call        ok to send notes. Follow-up by: Ruthe Mannan MD,  September 09, 2009 4:17 PM  Additional Follow-up for Phone Call Additional follow up Details #1::        Office visit note from 08/12/2009 faxed to fax number listed.  Additional Follow-up by: Linde Gillis CMA Duncan Dull),  September 09, 2009 4:52 PM

## 2010-06-29 NOTE — Assessment & Plan Note (Signed)
Summary: 3:00  CONGESTION,EARS,BODY ACHES,   Vital Signs:  Patient profile:   35 year old female Weight:      137 pounds BMI:     22.19 Temp:     98.8 degrees F oral Pulse rate:   76 / minute Pulse rhythm:   regular BP sitting:   130 / 90  (left arm) Cuff size:   large  Vitals Entered By: Linde Gillis CMA Duncan Dull) (July 06, 2009 3:01 PM) CC: ear pain, body aches, chills, nausea, diarrhea   History of Present Illness: 35 year old female:  sinus signs for about two weeks, now has some lymp nodes in ther l posterior and pain in the left maxillary  now is exhausted all the time.  not muscle achees and hurts all over. throat starting to hurt. making recently started today or yesterday. She is overall a really healthy lady.  has not run a fever  assessment cough, chronic runny nose and chronic sinus issues where she sees Dr. Corinda Gubler.   Review of systems: No fever, no nausea, vomiting, diarrhea. Otherwise as above. No rash.  Gen: WDWN, NAD; alert,appropriate and cooperative throughout exam  HEENT: Normocephalic and atraumatic. Throat clear, w/o exudate, no LAD, R TM clear, L TM - good landmarks, No fluid present. rhinnorhea.  Left frontal and maxillary sinuses: Tender at max Right frontal and maxillary sinuses: Tender, minimally on R max  Neck: No ant or post LAD  CV: RRR, No M/G/R  Pulm: Breathing comfortably in no resp distress. no w/c/r  Abd: S,NT,ND,+BS  Extr: no c/c/e  Psych: full affect, pleasant   Allergies: 1)  ! Amoxicillin 2)  ! Codeine 3)  ! Erythromycin  Past History:  Past Medical History: Allergic rhinitis Chronic sinus problems  Past History:  Care Management: Gynecology: Dr. Chevis Pretty Allergy: Dr. Jethro Bolus  Social History: Mardelle Matte Bugbee's wife Micah Flesher to [facility]   Impression & Recommendations:  Problem # 1:  SINUSITIS - ACUTE-NOS (ICD-461.9) Assessment New PCN allergic Zpak  If fails, LVQ appropriate  o/w supportive care,  now with onset of diffuse arthralgias, cannot r/o influenza and advised being at home for next 48 hours  Her updated medication list for this problem includes:    Nasacort Aq 55 Mcg/act Aers (Triamcinolone acetonide(nasal)) .Marland Kitchen..Marland Kitchen Two puffs once daily    Astepro 0.15 % Soln (Azelastine hcl) .Marland Kitchen..Marland Kitchen Two puffs once daily    Azithromycin 250 Mg Tabs (Azithromycin) .Marland Kitchen... 2 by  mouth today and then 1 daily for 4 days  Complete Medication List: 1)  Advair Diskus 100-50 Mcg/dose Aepb (Fluticasone-salmeterol) .... Inhale one puff in the morning and inhale one puff in the evening 2)  Nasacort Aq 55 Mcg/act Aers (Triamcinolone acetonide(nasal)) .... Two puffs once daily 3)  Astepro 0.15 % Soln (Azelastine hcl) .... Two puffs once daily 4)  Zyrtec Allergy 10 Mg Tabs (Cetirizine hcl) .... Take one tablet daily 5)  Multivitamins Tabs (Multiple vitamin) .... Takes one daily 6)  Chewable Calcium 500-200-40 Mg-unt-mcg Chew (Calcium-vitamin d-vitamin k) .... Otc as directed. 7)  Chewable Vitamin C 250 Mg Chew (Ascorbic acid) .... Otc as directed. 8)  Azithromycin 250 Mg Tabs (Azithromycin) .... 2 by  mouth today and then 1 daily for 4 days Prescriptions: AZITHROMYCIN 250 MG  TABS (AZITHROMYCIN) 2 by  mouth today and then 1 daily for 4 days  #6 x 0   Entered and Authorized by:   Hannah Beat MD   Signed by:   Hannah Beat MD  on 07/06/2009   Method used:   Electronically to        [facility]* (retail)       [address]       [place], [place]  [address]       Ph: [phone]       Fax: [phone]   RxID:   [id number]   Current Allergies (reviewed today): ! AMOXICILLIN ! CODEINE ! ERYTHROMYCIN

## 2010-06-29 NOTE — Assessment & Plan Note (Signed)
Summary: F/U,NOTE TO RETURN TO WORK/CLE   Vital Signs:  Patient profile:   35 year old female Height:      66 inches Weight:      131.38 pounds BMI:     21.28 Temp:     99.2 degrees F oral Pulse rate:   92 / minute Pulse rhythm:   regular BP sitting:   140 / 88  (left arm) Cuff size:   regular  Vitals Entered By: Delilah Shan CMA Duncan Dull) (September 23, 2009 9:49 AM) CC: F/U  -  Note to return to work.      Pt. has been taking Nexium samples.   History of Present Illness: 35 yo here for follow up depression.   Doing very well.  Still seeing Dr. Eddie Candle and taking Wellbutrin 300 XL 300 mg daily.  Depressive symptoms are much improved.   No SI or HI. She is ready to go back to work on May 2nd.  Already discussed strategies to deal with stress with both her psychiatrist and her supervisor, supervisor is very supportive.   She is actually looking forward to returning to work.  Easy bruising- she is concerned that she has always bruised easily.  No fatigue, night sweats, changes in weight.  No heavy or abnormal bleeding from nose, rectum or vagina.  Usually remember running into something before bruise appears (known trauma).  GERD- been taking Nexium which has drastically improved her symptoms or epigastric pain. No n/v/d.    Current Medications (verified): 1)  Advair Diskus 100-50 Mcg/dose Aepb (Fluticasone-Salmeterol) .... Inhale One Puff in The Morning and Inhale One Puff in The Evening 2)  Nasacort Aq 55 Mcg/act Aers (Triamcinolone Acetonide(Nasal)) .... Two Puffs Once Daily 3)  Astepro 0.15 % Soln (Azelastine Hcl) .... Two Puffs Once Daily 4)  Zyrtec Allergy 10 Mg Tabs (Cetirizine Hcl) .... Take One Tablet Daily 5)  Multivitamins   Tabs (Multiple Vitamin) .... Takes One Daily 6)  Chewable Calcium 500-200-40 Mg-Unt-Mcg Chew (Calcium-Vitamin D-Vitamin K) .... Otc As Directed. 7)  Chewable Vitamin C 250 Mg Chew (Ascorbic Acid) .... Otc As Directed. 8)  Ambien Cr 6.25 Mg  Tbcr  (Zolpidem Tartrate) .Marland Kitchen.. 1 By Mouth At [facility] Needed 9)  Wellbutrin Xl 300 Mg Xr24h-Tab (Bupropion Hcl) .... Take 1 Tablet By Mouth Once A Day 10)  Nexium 40 Mg Cpdr (Esomeprazole Magnesium) .... Take 1 Tab Each Morning  Allergies: 1)  ! Amoxicillin 2)  ! Codeine 3)  ! Erythromycin  Review of Systems      See HPI Psych:  Denies anxiety and depression. Heme:  Complains of abnormal bruising; denies bleeding, enlarge lymph nodes, fevers, pallor, and skin discoloration.  Physical Exam  General:  alert, NAD. Skin:  small healing echymosis on left forearm, larger healing echymosis on back of left calf. Psych:  Oriented X3, good eye contact, and depressed affect.looks great!!   Impression & Recommendations:  Problem # 1:  ADJ DISORDER WITH MIXED ANXIETY & DEPRESSED MOOD (ICD-309.28) Assessment Improved Continues to improve, would like to return to work.  Note given to return to work without restrictions on May 2nd.  Problem # 2:  CONTUSION (ICD-924.9) Assessment: New Reassurance provided but will get a CBC with diff today. Orders: Venipuncture ([id number]) TLB-CBC Platelet - w/Differential ([id number])  Problem # 3:  GERD (ICD-530.81) Assessment: Improved Continue Nexium.  Discussed short term use of PPI.  Pt in agreement with plan. Her updated medication list for this problem includes:    Nexium 40  Mg Cpdr (Esomeprazole magnesium) .Marland Kitchen... Take 1 tab each morning  Complete Medication List: 1)  Advair Diskus 100-50 Mcg/dose Aepb (Fluticasone-salmeterol) .... Inhale one puff in the morning and inhale one puff in the evening 2)  Nasacort Aq 55 Mcg/act Aers (Triamcinolone acetonide(nasal)) .... Two puffs once daily 3)  Astepro 0.15 % Soln (Azelastine hcl) .... Two puffs once daily 4)  Zyrtec Allergy 10 Mg Tabs (Cetirizine hcl) .... Take one tablet daily 5)  Multivitamins Tabs (Multiple vitamin) .... Takes one daily 6)  Chewable Calcium 500-200-40 Mg-unt-mcg Chew (Calcium-vitamin  d-vitamin k) .... Otc as directed. 7)  Chewable Vitamin C 250 Mg Chew (Ascorbic acid) .... Otc as directed. 8)  Ambien Cr 6.25 Mg Tbcr (Zolpidem tartrate) .Marland Kitchen.. 1 by mouth at bedtimeas needed 9)  Wellbutrin Xl 300 Mg Xr24h-tab (Bupropion hcl) .... Take 1 tablet by mouth once a day 10)  Nexium 40 Mg Cpdr (Esomeprazole magnesium) .... Take 1 tab each morning Prescriptions: NEXIUM 40 MG CPDR (ESOMEPRAZOLE MAGNESIUM) Take 1 tab each morning  #60 x 3   Entered and Authorized by:   Ruthe Mannan MD   Signed by:   Ruthe Mannan MD on 09/23/2009   Method used:   Electronically to        [facility]* (retail)       [address]       [place], [place]  [address]       Ph: [phone]       Fax: [phone]   RxID:   [id number]   Current Allergies (reviewed today): ! AMOXICILLIN ! CODEINE ! ERYTHROMYCIN

## 2010-06-29 NOTE — Letter (Signed)
Summary: Out of Work  [facility] [employer] [facility] at [facility]  [address] [place], [place] [address]   Phone: [phone]  Fax: [phone]    September 23, 2009   Employee:  [name]    To Whom It May Concern:   Shelley Figueroa is cleared to return to work on Monday, Sep 28, 2009. There are no restrictions on your work schedule or duties.  If you need additional information, please feel free to contact our office.         Sincerely,    Ruthe Mannan MD

## 2010-06-29 NOTE — Progress Notes (Signed)
Summary: very upset  Phone Note Call from Patient Call back at [facility] Phone [phone]   Caller: Patient Summary of Call: Pt called and was very upset and crying.  She says the trazadone that she was given yesterday is not helping.  Said she didnt sleep at all last night.  She is asking for you to call her. Initial call taken by: Lowella Petties CMA,  July 16, 2009 9:42 AM  Follow-up for Phone Call        Called patient back.  She thought Trazadone would help immediately and she did not sleep last night.  has ambien at home, wants to try that again.  I told her if she did that she needs to stop the Trazadone.  She agreed and will call me tomorrow. Follow-up by: Ruthe Mannan MD,  July 16, 2009 1:25 PM

## 2010-06-29 NOTE — Progress Notes (Signed)
Summary: Increasing Ambien dose  Phone Note Call from Patient Call back at Home Phone [phone]   Caller: Patient Call For: Hannah Beat MD Summary of Call: Patient was put on Ambien CR 6.25mg  by Dr. Dayton Martes.  She says that it worked for the first couple of nights and now she is waking up in the middle of the night and cannot get back to sleep.  Wants to know if this is normal or if she should increase the dose.  Please advise.   Initial call taken by: Linde Gillis CMA Duncan Dull),  July 27, 2009 8:14 AM  Follow-up for Phone Call        If she has Ambien 6.25 mg CR version, OK to take 2 for a short time period. (If on 12.5 mg CR, then do not cut)  Do not cut them -- it releases more medicine faster and you can wake up in the middle of the night.  Not a good solution long term - please keep your recheck with Dr. Dayton Martes in a couple of weeks. Follow-up by: Hannah Beat MD,  July 27, 2009 1:05 PM  Additional Follow-up for Phone Call Additional follow up Details #1::        Patient notified as instructed. Additional Follow-up by: Linde Gillis CMA Duncan Dull),  July 27, 2009 1:19 PM

## 2010-06-29 NOTE — Progress Notes (Signed)
Summary: regarding disability forms  Phone Note Other Incoming   CallerJeanine Luz with [employer]  [phone] Summary of Call: Insurance company has faxed back the disability papers that you filled out, they said they are not complete.  Please review.  Forms are on your desk. Initial call taken by: Lowella Petties CMA,  September 01, 2009 4:28 PM  Follow-up for Phone Call        On my desk. Follow-up by: Ruthe Mannan MD,  September 02, 2009 7:33 AM  Additional Follow-up for Phone Call Additional follow up Details #1::        Faxed.  Lugene Fuquay CMA (AAMA)  September 02, 2009 11:17 AM

## 2010-06-29 NOTE — Assessment & Plan Note (Signed)
Summary: 1 MONTH FOLLOW UP/[facility]   Vital Signs:  Patient profile:   35 year old female Height:      66 inches Weight:      133.38 pounds BMI:     21.61 Temp:     98.9 degrees F oral Pulse rate:   80 / minute Pulse rhythm:   regular BP sitting:   148 / 88  (left arm) Cuff size:   regular  Vitals Entered By: Delilah Shan CMA Duncan Dull) (August 12, 2009 9:56 AM) CC: 1 month follow up   History of Present Illness: 35 yo here for follow up depression.  Saw her last month for recurring depression, worsened by stressors at work.  Started her on Wellbutrin 150 mg daily. Symptoms had improved greatly until she started getting more pressure at work last week. 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 She has appt with psychiatry on 4/1. A couple of weeks after started wellbutrin, less crying spells, more motivated to work and less sexual dysfunction and irritability. No SI or HI. Husband very supportive.   Current Medications (verified): 1)  Advair Diskus 100-50 Mcg/dose Aepb (Fluticasone-Salmeterol) .... Inhale One Puff in The Morning and Inhale One Puff in The Evening 2)  Nasacort Aq 55 Mcg/act Aers (Triamcinolone Acetonide(Nasal)) .... Two Puffs Once Daily 3)  Astepro 0.15 % Soln (Azelastine Hcl) .... Two Puffs Once Daily 4)  Zyrtec Allergy 10 Mg Tabs (Cetirizine Hcl) .... Take One Tablet Daily 5)  Multivitamins   Tabs (Multiple Vitamin) .... Takes One Daily 6)  Chewable Calcium 500-200-40 Mg-Unt-Mcg Chew (Calcium-Vitamin D-Vitamin K) .... Otc As Directed. 7)  Chewable Vitamin C 250 Mg Chew (Ascorbic Acid) .... Otc As Directed. 8)  Ambien Cr 6.25 Mg  Tbcr (Zolpidem Tartrate) .Marland Kitchen.. 1 By Mouth At [facility] Needed 9)  Wellbutrin Sr 150 Mg Xr12h-Tab (Bupropion Hcl) .... 2 Tab By Mouth Every Morning.  Allergies: 1)  ! Amoxicillin 2)  ! Codeine 3)  ! Erythromycin  Review of Systems      See  HPI Psych:  Complains of anxiety, depression, easily tearful, and irritability; denies easily angered, mental problems, panic attacks, sense of great danger, suicidal thoughts/plans, thoughts of violence, unusual visions or sounds, and thoughts /plans of harming others.  Physical Exam  General:  alert, very tearful. Psych:  Oriented X3, good eye contact, and depressed affect. very tearful.     Impression & Recommendations:  Problem # 1:  ADJ DISORDER WITH MIXED ANXIETY & DEPRESSED MOOD (ICD-309.28) Assessment Unchanged Time spent with patient 25 minutes, more than 50% of this time was spent counseling patient on anxiety/depression. We agreed to increase Wellbutrin to 300 mg (12 hour relase). She will keep appointment with psychitary and follow up with me 1 month. Agreed to fill out FMLA forms for 6 weeks of short term disability. she will send forms to me today.  Complete Medication List: 1)  Advair Diskus 100-50 Mcg/dose Aepb (Fluticasone-salmeterol) .... Inhale one puff in the morning and inhale one puff in the evening 2)  Nasacort Aq 55 Mcg/act Aers (Triamcinolone acetonide(nasal)) .... Two puffs once daily 3)  Astepro 0.15 % Soln (Azelastine hcl) .... Two puffs once daily 4)  Zyrtec Allergy 10 Mg Tabs (Cetirizine hcl) .... Take one tablet daily 5)  Multivitamins Tabs (Multiple vitamin) .... Takes one daily 6)  Chewable Calcium 500-200-40 Mg-unt-mcg Chew (Calcium-vitamin d-vitamin k) .... Otc as directed. 7)  Chewable Vitamin C 250 Mg Chew (Ascorbic acid) .... Otc as directed. 8)  Ambien Cr 6.25 Mg Tbcr (Zolpidem tartrate) .Marland Kitchen.. 1 by mouth at bedtimeas needed 9)  Wellbutrin Sr 150 Mg Xr12h-tab (Bupropion hcl) .... 2 tab by mouth every morning. Prescriptions: AMBIEN CR 6.25 MG  TBCR (ZOLPIDEM TARTRATE) 1 by mouth at bedtimeas needed  #30 x 0   Entered and Authorized by:   Ruthe Mannan MD   Signed by:   Ruthe Mannan MD on 08/12/2009   Method used:   Print then Give to Patient   RxID:    [id number] WELLBUTRIN SR 150 MG XR12H-TAB (BUPROPION HCL) 2 tab by mouth every morning.  #60 x 3   Entered and Authorized by:   Ruthe Mannan MD   Signed by:   Ruthe Mannan MD on 08/12/2009   Method used:   Electronically to        [facility]* (retail)       [address]       [place], [place]  [address]       Ph: [phone]       Fax: [phone]   RxID:   [id number]   Current Allergies (reviewed today): ! AMOXICILLIN ! CODEINE ! ERYTHROMYCIN

## 2010-08-14 ENCOUNTER — Encounter: Payer: Self-pay | Admitting: Family Medicine

## 2010-09-01 LAB — POCT URINALYSIS DIP (DEVICE)
Bilirubin Urine: NEGATIVE
Glucose, UA: NEGATIVE mg/dL
Ketones, ur: NEGATIVE mg/dL
Nitrite: NEGATIVE
Protein, ur: NEGATIVE mg/dL
Specific Gravity, Urine: <=1.005 (ref 1.005–1.030)
Urobilinogen, UA: 0.2 mg/dL (ref 0.0–1.0)
pH: 6 (ref 5.0–8.0)

## 2010-09-01 LAB — URINE CULTURE
Colony Count: NO GROWTH
Culture: NO GROWTH

## 2010-10-03 ENCOUNTER — Inpatient Hospital Stay (INDEPENDENT_AMBULATORY_CARE_PROVIDER_SITE_OTHER)
Admission: RE | Admit: 2010-10-03 | Discharge: 2010-10-03 | Disposition: A | Discharge status: Home / Self Care | Payer: Private Health Insurance - Indemnity | Source: Ambulatory Visit | Attending: Family Medicine | Admitting: Family Medicine

## 2010-10-03 DIAGNOSIS — N23 Unspecified renal colic: Secondary | ICD-10-CM

## 2010-10-03 LAB — POCT URINALYSIS DIP (DEVICE)
Protein, ur: NEGATIVE mg/dL
Urobilinogen, UA: 0.2 mg/dL (ref 0.0–1.0)

## 2010-10-15 NOTE — Discharge Summary (Signed)
   NAME:  Shelley Figueroa, Shelley Figueroa                     ACCOUNT NO.:  [id number]   MEDICAL RECORD NO.:  [id number]                   PATIENT TYPE:  INP   LOCATION:  9131                                 FACILITY:  [facility]   PHYSICIAN:  Carrington Clamp, M.D.              DATE OF BIRTH:  [birth year]   DATE OF ADMISSION:  11/20/2002  DATE OF DISCHARGE:  11/23/2002                                 DISCHARGE SUMMARY   FINAL DIAGNOSES:  1. Intrauterine pregnancy at [redacted] weeks gestation.  2. Low lying placenta.   PROCEDURE:  Primary low transverse Cesarean section.   SURGEON:  Conley Simmonds, M.D.   ASSISTANT:  Dr. Lorin Picket __________.   COMPLICATIONS:  None.   HISTORY OF PRESENT ILLNESS:  This 35 year old G1, P0 presents at [redacted] weeks  gestation for a primary low transverse Cesarean section. The patient's  antepartum course had been complicated by history of infertility. She did  have IUI to get pregnant with this current pregnancy. She has a history of  HPV and history of chronic urinary tract infections and had this low lying  placenta and placenta previa throughout her pregnancy.   HOSPITAL COURSE:  The patient was taken to the OR on November 20, 2002 by Dr.  Conley Simmonds where a primary low transverse Cesarean section was performed  with the delivery of a 7 pound 8 ounce female infant with Apgar's of 9 and 9.  Delivered without complications. The patient's postoperative course was  complicated by some postoperative anemia, which she was started on iron for  during her hospital stay. She did not want her little boy circumcised and  she was felt ready for discharge on postoperative day #3.   DISPOSITION:  She was sent home on a regular diet, told to decrease  activities, told to continue prenatal vitamins and her FeSO4 325 mg one  t.i.d. Was given a prescription for Percocet 1-2 every 4 hours as needed for  pain.   FOLLOW UP:  Told to followup in the office in 4 weeks or of course, to call  with any  increased bleeding, pain, or other problems.     Leilani Able, P.A.-C.                Carrington Clamp, M.D.    MB/MEDQ  D:  12/15/2002  T:  12/16/2002  Job:  [id number]

## 2010-10-15 NOTE — Op Note (Signed)
NAME:  Shelley Figueroa, Shelley Figueroa                     ACCOUNT NO.:  [id number]   MEDICAL RECORD NO.:  [id number]                   PATIENT TYPE:  INP   LOCATION:  9131                                 FACILITY:  [facility]   PHYSICIAN:  Randye Lobo, M.D.                DATE OF BIRTH:  12-Jun-1975   DATE OF PROCEDURE:  11/20/2002  DATE OF DISCHARGE:                                 OPERATIVE REPORT   PREOPERATIVE DIAGNOSES:  1. Intrauterine gestation at 38 weeks.  2. Low-lying placenta.   POSTOPERATIVE DIAGNOSES:  1. Intrauterine gestation at 38 weeks.  2. Low-lying placenta.   PROCEDURE:  Primary low segment transverse cesarean section.   SURGEON:  Randye Lobo, M.D.   ASSISTANT:  Luvenia Redden, M.D.   ANESTHESIA:  Spinal.   INTRAVENOUS FLUIDS:  3900 mL Ringer's lactate.   ESTIMATED BLOOD LOSS:  900 mL.   URINE OUTPUT:  350 mL.   COMPLICATIONS:  None.   INDICATION FOR PROCEDURE:  The patient is a 35 year old gravida 1, para 29,  Caucasian female at 63 weeks' gestation, [facility] December 02, 2002, who presented  throughout her pregnancy with a partial placenta previa.  The patient was  followed with serial ultrasound at 19, 28, and 33-1/2 weeks' gestation.  At  the most recent ultrasound, the placenta was noted to be only 1 cm from the  internal os.  Extensive discussion was held with the patient regarding her  options for care, and a decision was made to proceed with a primary low  segment transverse cesarean section after the risks, benefits, and  alternatives were discussed with her and her husband.  They chose to  proceed.   FINDINGS:  A viable female infant was delivered at 9:37 a.m. with Apgars of 9  at one minute and 9 at five minutes.  The weight was later noted to be 7  pounds 8 ounces.  The amniotic fluid was clear.  The placenta had a normal  configuration and insertion of a three-vessel cord.  The uterus, tubes, and  ovaries were all normal.   SPECIMENS:  None.   PROCEDURE:   With an IV in place, the patient was taken to the operating room  after she was properly identified.  The patient received a spinal anesthetic  and was then placed in the supine position with a left lateral tilt.  The  abdomen was sterilely prepped and a Foley catheter was placed inside the  bladder.  The patient was then draped.  A Pfannenstiel incision was then  created sharply with a scalpel and the incision was carried down to the  fascia using monopolar cautery, which was used to create hemostasis in the  subcutaneous layer.  The fascia was then incised in the midline with a  scalpel and the incision was extended bilaterally with a Mayo scissors.  The  rectus muscles were dissected off of the overlying fascia with  sharp  dissection superiorly and inferiorly and the parietal peritoneum was entered  sharply with a Metzenbaum scissors after the peritoneum was elevated with  hemostat clamps.  The incision in the peritoneum was extended cranially and  caudally with care taken to avoid enterotomy and cystotomy.   The lower uterine segment was exposed with a bladder retractor.  A bladder  flap was created sharply with the Metzenbaum scissors.  A transverse lower  uterine segment incision was then created with a scalpel and the incision  was extended bluntly in the transverse direction.  Membranes were ruptured  and clear fluid was noted.  A hand was inserted through the uterine incision  and the vertex was elevated.  The vertex was noted to be high in the pelvis,  and a decision was therefore made to proceed with assistance with the  Mityvac.  The vacuum was placed over the vertex and proper pressure was  applied and the vertex was delivered without difficulty.  The nares and  mouth were suctioned and the remainder of the infant was delivered.  The  cord was doubly clamped and cut and the newborn was then carried over to the  awaiting pediatricians.  Cord blood was obtained and the placenta  was  manually extracted and sent to labor and delivery.  The uterus was then  exteriorized for its closure.  A moistened lap pad was used to remove any  remaining products of conception from within the uterine cavity, and there  were none.  A single-layer closure of #1 chromic was performed in a locked  fashion.  This provided excellent hemostasis.  The pelvis was then suctioned  of any remaining blood clots and the uterus was returned to the peritoneal  cavity.  The incision was re-examined and found to be hemostatic.   The abdomen was closed at this time.  A running layer of 3-0 Vicryl was  placed in the parietal peritoneum.  The rectus muscles were then  reapproximated in the midline by placing interrupted sutures of #1 chromic.  The fascia was closed with a running suture of 0 Vicryl after it was found  to be hemostatic.  The subcutaneous tissue was then irrigated and suctioned  and two small areas were cauterized with monopolar cautery for excellent  hemostasis.  The skin was then closed with staples.  Betadine was cleansed  from the patient's abdomen and a sterile bandage was placed over the  incision.  The uterus was expressed of remaining clot.  The patient was then  escorted to the recovery room in stable and awake condition.  There were no  complications to the procedure.  All needle, instrument, and sponge counts  were correct.                                               Randye Lobo, M.D.    BES/MEDQ  D:  11/20/2002  T:  11/21/2002  Job:  [id number]

## 2011-01-06 ENCOUNTER — Ambulatory Visit (INDEPENDENT_AMBULATORY_CARE_PROVIDER_SITE_OTHER): Payer: Private Health Insurance - Indemnity | Admitting: Family Medicine

## 2011-01-06 ENCOUNTER — Encounter: Payer: Self-pay | Admitting: Family Medicine

## 2011-01-06 VITALS — BP 120/72 | HR 90 | Temp 99.3°F | Ht 66.0 in | Wt 127.4 lb

## 2011-01-06 DIAGNOSIS — Z13220 Encounter for screening for lipoid disorders: Secondary | ICD-10-CM

## 2011-01-06 DIAGNOSIS — R0781 Pleurodynia: Secondary | ICD-10-CM

## 2011-01-06 DIAGNOSIS — R079 Chest pain, unspecified: Secondary | ICD-10-CM

## 2011-01-06 LAB — LIPID PANEL
Cholesterol: 200 mg/dL (ref 0–200)
HDL: 56.6 mg/dL (ref >39.00)
LDL Cholesterol: 128 mg/dL — ABNORMAL HIGH (ref 0–99)
Total CHOL/HDL Ratio: 4
Triglycerides: 76 mg/dL (ref 0.0–149.0)

## 2011-01-06 NOTE — Progress Notes (Signed)
  Subjective:    Patient ID: Shelley Figueroa, female    DOB: 11-Apr-1976, 35 y.o.   MRN: [id number]  HPI  In for twelve hours.  Has a palpable lump on the left side - has been there for several weeks. Soreness will come and go.  Pain at her lowest rib Lifting stuff all the time. Travels all the time. Has been at least a few weeks.   Getting paps from Teodora Medici, MD.  The PMH, PSH, Social History, Family History, Medications, and allergies have been reviewed in [facility], and have been updated if relevant.  Review of Systems ROS: GEN: No acute illnesses, no fevers, chills. GI: No n/v/d, eating normally Pulm: No SOB Interactive and getting along well at home.  Otherwise, ROS is as per the HPI.     Objective:   Physical Exam   Physical Exam  Blood pressure 120/72, pulse 90, temperature 99.3 F (37.4 C), temperature source Oral, height 5\' 6"  (1.676 m), weight 127 lb 6.4 oz (57.788 kg), SpO2 99.00%.  GEN: WDWN, NAD, Non-toxic, A & O x 3 HEENT: Atraumatic, Normocephalic. Neck supple. No masses, No LAD. Ears and Nose: No external deformity. EXTR: No c/c/e NEURO Normal gait.  PSYCH: Normally interactive. Conversant. Not depressed or anxious appearing.  Calm demeanor.   MSK: on the left side of last rib, pain around oblique insertion. Pain with compression of inferior rib. No palpable soft tissue mass.    Assessment & Plan:   1. Rib pain    2. Screening for lipoid disorders  Lipid panel   Reassured, i think likely oblique strain at insertion into lowest rib.  Alleve 2 tabs by mouth two tmes a day over the counter: Take at least for 2 - 3 weeks. This is equal to a prescripton strength dose Basic core ROM and stretching

## 2011-01-07 ENCOUNTER — Encounter: Payer: Self-pay | Admitting: *Deleted

## 2011-03-11 LAB — POCT URINE HEMOGLOBIN: Hgb urine dipstick: POSITIVE — AB

## 2011-03-11 LAB — URINE MICROSCOPIC-ADD ON

## 2011-03-11 LAB — POCT PREGNANCY, URINE
Operator id: 284251
Preg Test, Ur: NEGATIVE

## 2011-03-11 LAB — URINALYSIS, ROUTINE W REFLEX MICROSCOPIC
Bilirubin Urine: NEGATIVE
Glucose, UA: NEGATIVE
Ketones, ur: NEGATIVE
Leukocytes, UA: NEGATIVE
Nitrite: NEGATIVE
Protein, ur: NEGATIVE
Specific Gravity, Urine: 1.014
Urobilinogen, UA: 0.2
pH: 6

## 2011-05-19 ENCOUNTER — Encounter: Payer: Self-pay | Admitting: Family Medicine

## 2011-05-19 ENCOUNTER — Ambulatory Visit (INDEPENDENT_AMBULATORY_CARE_PROVIDER_SITE_OTHER): Payer: Private Health Insurance - Indemnity | Admitting: Family Medicine

## 2011-05-19 VITALS — BP 110/70 | HR 90 | Temp 98.7°F | Ht 66.0 in | Wt 124.0 lb

## 2011-05-19 DIAGNOSIS — J069 Acute upper respiratory infection, unspecified: Secondary | ICD-10-CM

## 2011-05-19 MED ORDER — AZITHROMYCIN 250 MG PO TABS: ORAL_TABLET | ORAL | Status: AC

## 2011-05-19 MED ORDER — BENZONATATE 200 MG PO CAPS: 200.0000 mg | ORAL_CAPSULE | Freq: Three times a day (TID) | ORAL | Status: AC | PRN

## 2011-05-19 NOTE — Patient Instructions (Signed)
Take antibiotic as directed.  Drink lots of fluids.  Treat sympotmatically with Mucinex, nasal saline irrigation, and Tylenol/Ibuprofen. Also try claritin D or zyrtec D over the counter- two times a day as needed ( have to sign for them at pharmacy). You can use warm compresses.  Cough suppressant at night. Call if not improving as expected in 5-7 days.    

## 2011-05-19 NOTE — Progress Notes (Signed)
SUBJECTIVE:  Shelley Figueroa is a 35 y.o. female who complains of coryza, congestion, sore throat, productive cough and fever for 14 days. She denies a history of chest pain and denies a history of asthma. Patient denies smoke cigarettes.   Symptoms were improving but cough became more productive over past few days. Spike a temp of 102 3 days ago.  Patient Active Problem List  Diagnoses  . ADJ DISORDER WITH MIXED ANXIETY & DEPRESSED MOOD  . ALLERGIC RHINITIS  . GERD  . Screening for lipoid disorders   Past Medical History  Diagnosis Date  . Allergy   . Sinus problem     chronic  . Depression    No past surgical history on file. History  Substance Use Topics  . Smoking status: Never Smoker   . Smokeless tobacco: Not on file  . Alcohol Use: Not on file   No family history on file. Allergies  Allergen Reactions  . Amoxicillin     REACTION: rash  . Codeine     REACTION: itching  . Erythromycin     REACTION: nausea and vomiting   Current Outpatient Prescriptions on File Prior to Visit  Medication Sig Dispense Refill  . ascorbic acid (VITAMIN C) 250 MG CHEW Chew 250 mg by mouth daily.        . Azelastine HCl (ASTEPRO) 0.15 % SOLN 2 puffs by Nasal route daily.        Marland Kitchen buPROPion (WELLBUTRIN XL) 300 MG 24 hr tablet Take 300 mg by mouth daily.        . cetirizine (ZYRTEC) 10 MG tablet Take 10 mg by mouth daily.        . Fluticasone-Salmeterol (ADVAIR DISKUS) 100-50 MCG/DOSE AEPB Inhale 1 puff into the lungs every 12 (twelve) hours.        . mometasone (NASONEX) 50 MCG/ACT nasal spray Place 2 sprays into the nose daily.        . Multiple Vitamin (MULTIVITAMIN PO) Take 1 tablet by mouth daily.        . Calcium-Vitamin D-Vitamin K (CHEWABLE CALCIUM) 500-200-40 MG-UNT-MCG CHEW Chew by mouth as directed.         The PMH, PSH, Social History, Family History, Medications, and allergies have been reviewed in [facility], and have been updated if relevant.  OBJECTIVE: BP 110/70  Pulse 90   Temp(Src) 98.7 F (37.1 C) (Oral)  Ht 5\' 6"  (1.676 m)  Wt 124 lb (56.246 kg)  BMI 20.01 kg/m2  She appears well, vital signs are as noted. Ears normal.  Throat and pharynx normal.  Neck supple. No adenopathy in the neck. Nose is congested. Sinuses TTP throughout. The chest is clear, without wheezes or rales.  ASSESSMENT:  sinusitis  PLAN:Given duration and progression of symptoms, will treat for bacterial sinusitis, can take Zpack (multple abx allergies). Symptomatic therapy suggested: push fluids, rest and return office visit prn if symptoms persist or worsen. Lack of antibiotic effectiveness discussed with her. Call or return to clinic prn if these symptoms worsen or fail to improve as anticipated.

## 2012-01-31 ENCOUNTER — Encounter: Payer: Self-pay | Admitting: Family Medicine

## 2012-01-31 ENCOUNTER — Ambulatory Visit (INDEPENDENT_AMBULATORY_CARE_PROVIDER_SITE_OTHER): Payer: Private Health Insurance - Indemnity | Admitting: Family Medicine

## 2012-01-31 VITALS — BP 130/90 | HR 76 | Temp 99.1°F | Wt 130.0 lb

## 2012-01-31 DIAGNOSIS — J029 Acute pharyngitis, unspecified: Secondary | ICD-10-CM

## 2012-01-31 DIAGNOSIS — J019 Acute sinusitis, unspecified: Secondary | ICD-10-CM

## 2012-01-31 MED ORDER — AZITHROMYCIN 250 MG PO TABS: ORAL_TABLET | ORAL | Status: AC

## 2012-01-31 NOTE — Progress Notes (Signed)
  Subjective:    Patient ID: Shelley Figueroa, female    DOB: 1975/08/25, 36 y.o.   MRN: [id number]  Sinus Problem This is a new problem. The current episode started in the past 7 days (in last 24 hours fever, sore throat began). The problem has been gradually worsening since onset. The maximum temperature recorded prior to her arrival was 100 - 100.9 F. The pain is mild. Associated symptoms include congestion, coughing, ear pain, sinus pressure and a sore throat. Pertinent negatives include no chills or shortness of breath. (Left ear pain) Treatments tried: afrin and sudafed. The treatment provided mild relief.    Both children positive for strep in last 3 days.   Allergic to e-mycin and PCN....azithromycin works for her.   Review of Systems  Constitutional: Negative for chills.  HENT: Positive for ear pain, congestion, sore throat and sinus pressure.   Respiratory: Positive for cough. Negative for shortness of breath.        Objective:   Physical Exam  Constitutional: Vital signs are normal. She appears well-developed and well-nourished. She is cooperative.  Non-toxic appearance. She does not appear ill. No distress.  HENT:  Head: Normocephalic.  Right Ear: Hearing, tympanic membrane, external ear and ear canal normal. Tympanic membrane is not erythematous, not retracted and not bulging.  Left Ear: Hearing, tympanic membrane, external ear and ear canal normal. Tympanic membrane is not erythematous, not retracted and not bulging.  Nose: Mucosal edema and rhinorrhea present. Right sinus exhibits maxillary sinus tenderness. Right sinus exhibits no frontal sinus tenderness. Left sinus exhibits maxillary sinus tenderness. Left sinus exhibits no frontal sinus tenderness.  Mouth/Throat: Uvula is midline and mucous membranes are normal. Posterior oropharyngeal erythema present. No oropharyngeal exudate or posterior oropharyngeal edema.  Eyes: Conjunctivae, EOM and lids are normal. Pupils are  equal, round, and reactive to light. No foreign bodies found.  Neck: Trachea normal and normal range of motion. Neck supple. Carotid bruit is not present. No mass and no thyromegaly present.  Cardiovascular: Normal rate, regular rhythm, S1 normal, S2 normal, normal heart sounds, intact distal pulses and normal pulses.  Exam reveals no gallop and no friction rub.   No murmur heard. Pulmonary/Chest: Effort normal and breath sounds normal. Not tachypneic. No respiratory distress. She has no decreased breath sounds. She has no wheezes. She has no rhonchi. She has no rales.  Neurological: She is alert.  Skin: Skin is warm, dry and intact. No rash noted.  Psychiatric: Her speech is normal and behavior is normal. Judgment normal. Her mood appears not anxious. Cognition and memory are normal. She does not exhibit a depressed mood.          Assessment & Plan:

## 2012-01-31 NOTE — Patient Instructions (Signed)
Complete antibiotics, use nasal saline irrigation and mucinex.  Stop decongestants and afrin as BP is elevated.  Call if not improving as expected.

## 2012-01-31 NOTE — Assessment & Plan Note (Signed)
Given symptoms greater that 1 week and now with super imposed fever.. Treat with antibiotics.  Azithromycin has helped in past per pt. Allergies to other first line meds.

## 2012-04-23 ENCOUNTER — Other Ambulatory Visit: Payer: Self-pay | Admitting: Gynecology

## 2012-04-23 DIAGNOSIS — N6322 Unspecified lump in the left breast, upper inner quadrant: Secondary | ICD-10-CM

## 2012-04-27 ENCOUNTER — Ambulatory Visit
Admission: RE | Admit: 2012-04-27 | Discharge: 2012-04-27 | Disposition: A | Discharge status: Home / Self Care | Payer: Managed Care, Other (non HMO) | Source: Ambulatory Visit | Attending: Gynecology | Admitting: Gynecology

## 2012-04-27 DIAGNOSIS — N6322 Unspecified lump in the left breast, upper inner quadrant: Secondary | ICD-10-CM

## 2013-05-30 HISTORY — PX: NASAL SINUS SURGERY: SHX719

## 2013-08-29 ENCOUNTER — Encounter: Payer: Self-pay | Admitting: Internal Medicine

## 2013-08-29 ENCOUNTER — Ambulatory Visit (INDEPENDENT_AMBULATORY_CARE_PROVIDER_SITE_OTHER): Payer: Managed Care, Other (non HMO) | Admitting: Internal Medicine

## 2013-08-29 VITALS — BP 132/84 | HR 76 | Temp 98.8°F | Wt 132.0 lb

## 2013-08-29 DIAGNOSIS — B379 Candidiasis, unspecified: Secondary | ICD-10-CM

## 2013-08-29 DIAGNOSIS — J209 Acute bronchitis, unspecified: Secondary | ICD-10-CM

## 2013-08-29 DIAGNOSIS — T3695XA Adverse effect of unspecified systemic antibiotic, initial encounter: Secondary | ICD-10-CM

## 2013-08-29 MED ORDER — PREDNISONE 10 MG PO TABS: ORAL_TABLET | ORAL | Status: DC

## 2013-08-29 MED ORDER — AZITHROMYCIN 250 MG PO TABS: ORAL_TABLET | ORAL | Status: DC

## 2013-08-29 MED ORDER — FLUCONAZOLE 150 MG PO TABS: 150.0000 mg | ORAL_TABLET | Freq: Once | ORAL | Status: DC

## 2013-08-29 NOTE — Patient Instructions (Addendum)
Acute Bronchitis Bronchitis is inflammation of the airways that extend from the windpipe into the lungs (bronchi). The inflammation often causes mucus to develop. This leads to a cough, which is the most common symptom of bronchitis.  In acute bronchitis, the condition usually develops suddenly and goes away over time, usually in a couple weeks. Smoking, allergies, and asthma can make bronchitis worse. Repeated episodes of bronchitis may cause further lung problems.  CAUSES Acute bronchitis is most often caused by the same virus that causes a cold. The virus can spread from person to person (contagious).  SIGNS AND SYMPTOMS   Cough.   Fever.   Coughing up mucus.   Body aches.   Chest congestion.   Chills.   Shortness of breath.   Sore throat.  DIAGNOSIS  Acute bronchitis is usually diagnosed through a physical exam. Tests, such as chest X-rays, are sometimes done to rule out other conditions.  TREATMENT  Acute bronchitis usually goes away in a couple weeks. Often times, no medical treatment is necessary. Medicines are sometimes given for relief of fever or cough. Antibiotics are usually not needed but may be prescribed in certain situations. In some cases, an inhaler may be recommended to help reduce shortness of breath and control the cough. A cool mist vaporizer may also be used to help thin bronchial secretions and make it easier to clear the chest.  HOME CARE INSTRUCTIONS  Get plenty of rest.   Drink enough fluids to keep your urine clear or pale yellow (unless you have a medical condition that requires fluid restriction). Increasing fluids may help thin your secretions and will prevent dehydration.   Only take over-the-counter or prescription medicines as directed by your health care provider.   Avoid smoking and secondhand smoke. Exposure to cigarette smoke or irritating chemicals will make bronchitis worse. If you are a smoker, consider using nicotine gum or skin  patches to help control withdrawal symptoms. Quitting smoking will help your lungs heal faster.   Reduce the chances of another bout of acute bronchitis by washing your hands frequently, avoiding people with cold symptoms, and trying not to touch your hands to your mouth, nose, or eyes.   Follow up with your health care provider as directed.  SEEK MEDICAL CARE IF: Your symptoms do not improve after 1 week of treatment.  SEEK IMMEDIATE MEDICAL CARE IF:  You develop an increased fever or chills.   You have chest pain.   You have severe shortness of breath.  You have bloody sputum.   You develop dehydration.  You develop fainting.  You develop repeated vomiting.  You develop a severe headache. MAKE SURE YOU:   Understand these instructions.  Will watch your condition.  Will get help right away if you are not doing well or get worse. Document Released: 06/23/2004 Document Revised: 01/16/2013 Document Reviewed: 11/06/2012 [employer] Patient Information 2014 [employer].  

## 2013-08-29 NOTE — Progress Notes (Signed)
HPI  Pt presents to the clinic today with c/o cough and chest congestion. She reports this started 2 weeks ago. The cough is productive of thick yellow phlegm. The cough is worse at night. She did run fevers during the first week but non since. She has tried OTC saline spray, afrin, sudafed and mucinex. She does have a history of allergies. She takes zyrtec and flonase for that. She has had sick contacts. She does not smoke.   Review of Systems      Past Medical History  Diagnosis Date  . Allergy   . Sinus problem     chronic  . Depression     History reviewed. No pertinent family history.  History   Social History  . Marital Status: Married    Spouse Name: N/A    Number of Children: N/A  . Years of Education: N/A   Occupational History  . Not on file.   Social History Main Topics  . Smoking status: Never Smoker   . Smokeless tobacco: Not on file  . Alcohol Use: Yes     Comment: occasional  . Drug Use: Not on file  . Sexual Activity: Not on file   Other Topics Concern  . Not on file   Social History Narrative  . No narrative on file    Allergies  Allergen Reactions  . Amoxicillin     REACTION: rash  . Codeine     REACTION: itching  . Erythromycin     REACTION: nausea and vomiting     Constitutional: Positive headache, fatigue and fever. Denies abrupt weight changes.  HEENT:  Positive sore throat. Denies eye redness, eye pain, pressure behind the eyes, facial pain, nasal congestion, ear pain, ringing in the ears, wax buildup, runny nose or bloody nose. Respiratory: Positive cough. Denies difficulty breathing or shortness of breath.  Cardiovascular: Denies chest pain, chest tightness, palpitations or swelling in the hands or feet.   No other specific complaints in a complete review of systems (except as listed in HPI above).  Objective:   BP 132/84  Pulse 76  Temp(Src) 98.8 F (37.1 C) (Oral)  Wt 132 lb (59.875 kg)  SpO2 98% Wt Readings from Last 3  Encounters:  08/29/13 132 lb (59.875 kg)  01/31/12 130 lb (58.968 kg)  05/19/11 124 lb (56.246 kg)     General: Appears her stated age, ill appearing but in NAD. HEENT: Head: normal shape and size; Eyes: sclera white, no icterus, conjunctiva pink, PERRLA and EOMs intact; Ears: Tm's gray and intact, normal light reflex; Nose: mucosa pink and moist, septum midline; Throat/Mouth: + PND. Teeth present, mucosa erythematous and moist, no exudate noted, no lesions or ulcerations noted.  Neck: Mild cervical lymphadenopathy. Neck supple, trachea midline. No massses, lumps or thyromegaly present.  Cardiovascular: Normal rate and rhythm. S1,S2 noted.  No murmur, rubs or gallops noted. No JVD or BLE edema. No carotid bruits noted. Pulmonary/Chest: Normal effort and scattered rhonchi and bilateral expiratory wheeze throughout.      Assessment & Plan:   Acute Bronchitis:  Get some rest and drink plenty of water Do salt water gargles for the sore throat eRx for Azithromax x 5 days eRx for pred taper eRx for Diflucan for antibiotic induced yeast infection  RTC as needed or if symptoms persist.

## 2013-08-29 NOTE — Progress Notes (Signed)
Pre visit review using our clinic review tool, if applicable. No additional management support is needed unless otherwise documented below in the visit note. 

## 2013-10-07 ENCOUNTER — Ambulatory Visit (INDEPENDENT_AMBULATORY_CARE_PROVIDER_SITE_OTHER): Payer: Managed Care, Other (non HMO) | Admitting: Internal Medicine

## 2013-10-07 ENCOUNTER — Telehealth: Payer: Self-pay

## 2013-10-07 ENCOUNTER — Encounter: Payer: Self-pay | Admitting: Internal Medicine

## 2013-10-07 VITALS — BP 116/86 | HR 84 | Temp 99.0°F | Wt 130.2 lb

## 2013-10-07 DIAGNOSIS — J019 Acute sinusitis, unspecified: Secondary | ICD-10-CM

## 2013-10-07 MED ORDER — CEFUROXIME AXETIL 500 MG PO TABS: 500.0000 mg | ORAL_TABLET | Freq: Two times a day (BID) | ORAL | Status: DC

## 2013-10-07 MED ORDER — BENZONATATE 200 MG PO CAPS: 200.0000 mg | ORAL_CAPSULE | Freq: Two times a day (BID) | ORAL | Status: DC | PRN

## 2013-10-07 NOTE — Telephone Encounter (Signed)
I would have her continue. If it happens again, I will change abx in am

## 2013-10-07 NOTE — Telephone Encounter (Signed)
See msg below--it says she took with food--do you need more information? Please advise

## 2013-10-07 NOTE — Progress Notes (Signed)
HPI  Pt presents to the clinic today with c/o cough, fever, chills, ear fullness, and sore throat. She reports this started 1 week ago. The cough is productive of yellow mucous. She has run fever as high at 101. She has tried Sudafed, Afrin, Saline nasal spray, Zyrtec, and an OTC cough suppressant. She does have a history of allergies and sinus issues. She has had sick contacts at work.  Review of Systems    Past Medical History  Diagnosis Date  . Allergy   . Sinus problem     chronic  . Depression     History reviewed. No pertinent family history.  History   Social History  . Marital Status: Married    Spouse Name: N/A    Number of Children: N/A  . Years of Education: N/A   Occupational History  . Not on file.   Social History Main Topics  . Smoking status: Never Smoker   . Smokeless tobacco: Not on file  . Alcohol Use: Yes     Comment: occasional  . Drug Use: Not on file  . Sexual Activity: Not on file   Other Topics Concern  . Not on file   Social History Narrative  . No narrative on file    Allergies  Allergen Reactions  . Amoxicillin     REACTION: rash  . Codeine     REACTION: itching  . Erythromycin     REACTION: nausea and vomiting     Constitutional: Positive fatigue and fever. Denies headache or abrupt weight changes.  HEENT:  Positive nasal congestion and sore throat. Denies eye redness, ear pain, ringing in the ears, wax buildup, runny nose or bloody nose. Respiratory: Positive cough. Denies difficulty breathing or shortness of breath.  Cardiovascular: Denies chest pain, chest tightness, palpitations or swelling in the hands or feet.   No other specific complaints in a complete review of systems (except as listed in HPI above).  Objective:    General: Appears her stated age, well developed, well nourished in NAD. HEENT: Head: normal shape and size, maxillary sinus tenderness noted; Eyes: sclera white, no icterus, conjunctiva pink, PERRLA and  EOMs intact; Ears: Tm's gray and intact, normal light reflex, + effusion bilaterally; Nose: mucosa pink and moist, septum midline; Throat/Mouth: + PND. Teeth present, mucosa erythematous and moist, no exudate noted, no lesions or ulcerations noted.  Neck: Neck supple, trachea midline. No massses, lumps or thyromegaly present.  Cardiovascular: Normal rate and rhythm. S1,S2 noted.  No murmur, rubs or gallops noted. No JVD or BLE edema. No carotid bruits noted. Pulmonary/Chest: Normal effort and diminished breath sounds. No respiratory distress. No wheezes, rales or ronchi noted.      Assessment & Plan:   Acute bacterial sinusitis  Can use a Neti Pot which can be purchased from your local drug store. Continue zyrtec and flonase eRx for tessalon pearls for cough Ceftin BID for 10 days  RTC as needed or if symptoms persist.

## 2013-10-07 NOTE — Progress Notes (Signed)
Pre visit review using our clinic review tool, if applicable. No additional management support is needed unless otherwise documented below in the visit note. 

## 2013-10-07 NOTE — Telephone Encounter (Signed)
Pt left v/m; [place] [facility]; pt was seen earlier today; pt took ceftin with food and shortly after pt started vomiting. Pt wants to know if should try taking again or should a [facility]. Pt request cb.

## 2013-10-07 NOTE — Patient Instructions (Addendum)

## 2013-10-07 NOTE — Telephone Encounter (Signed)
Ive never heard that ceftin causing vomiting. Did she eat with food

## 2013-10-08 ENCOUNTER — Other Ambulatory Visit: Payer: Self-pay | Admitting: Internal Medicine

## 2013-10-08 MED ORDER — DOXYCYCLINE HYCLATE 100 MG PO TABS: 100.0000 mg | ORAL_TABLET | Freq: Two times a day (BID) | ORAL | Status: DC

## 2013-10-08 NOTE — Telephone Encounter (Signed)
Left detailed msg on VM per HIPAA letting pt know Rx was sent to pharmacy 

## 2013-10-08 NOTE — Telephone Encounter (Signed)
Doxycycline called into pharmacy

## 2013-10-08 NOTE — Telephone Encounter (Signed)
Husband called this morning, pt still in [place] and has not thrown up since stopped antibiotic last night.  Called pt at [phone] to ask Pharmacy info, she would like [facility] antibiotic [facility], they are transferring with insurance info to [place] preferred Pharmacy [place].  Herbert SetaHeather would like a phone message [phone] when [facility].

## 2013-10-08 NOTE — Telephone Encounter (Signed)
Pt's husband called and stated that pt is still vomiting and not doing any better--she is in [place]--Linda T has told husband to find pharmacy close by and call with that information--once that information is received--what abx would you like to send in?

## 2013-10-15 ENCOUNTER — Encounter: Payer: Self-pay | Admitting: Family Medicine

## 2013-10-15 ENCOUNTER — Ambulatory Visit (INDEPENDENT_AMBULATORY_CARE_PROVIDER_SITE_OTHER): Payer: Managed Care, Other (non HMO) | Admitting: Family Medicine

## 2013-10-15 VITALS — BP 130/86 | HR 88 | Temp 99.1°F | Ht 65.51 in | Wt 131.0 lb

## 2013-10-15 DIAGNOSIS — J209 Acute bronchitis, unspecified: Secondary | ICD-10-CM | POA: Insufficient documentation

## 2013-10-15 MED ORDER — ALBUTEROL SULFATE HFA 108 (90 BASE) MCG/ACT IN AERS: 2.0000 | INHALATION_SPRAY | RESPIRATORY_TRACT | Status: DC | PRN

## 2013-10-15 NOTE — Progress Notes (Signed)
Pre visit review using our clinic review tool, if applicable. No additional management support is needed unless otherwise documented below in the visit note. 

## 2013-10-15 NOTE — Assessment & Plan Note (Signed)
Viral vs bacterial  Trigger. Already on antibiotics, appropriate. Complete course. Treat bronchospasm with inhaler prn.  Continue advair twice daily.

## 2013-10-15 NOTE — Patient Instructions (Addendum)
Use albuterol as needed for coughing fits and shortness of breath. Complete antibiotics. Call if not starting to improve in next 5-7 days.

## 2013-10-15 NOTE — Progress Notes (Signed)
   Subjective:    Patient ID: Shelley Figueroa, female    DOB: 1976-01-24, 38 y.o.   MRN: [id number]  Cough Associated symptoms include chest pain and a fever.  Chest Pain  Associated symptoms include a cough and a fever.  Fever  Associated symptoms include chest pain and coughing.    38 year old female presents for  Cough, fever following POV with Ovidio Hangeregina Beatty on 5/12.  At that OV:  DX Acute bacterial sinusitis  Can use a Neti Pot which can be purchased from your local drug store.  Continue zyrtec and flonase  eRx for tessalon pearls for cough  Ceftin BID for 10 days   Had SE and changed to doxycyline ( she is on  day7/10)   Today she reports sinus congestion clear, no face, no ear pain. She has noted fever continue daily...99.5- 100.2. Using tylenol to treat. She has deep cough, productive of yellow mucus, worse in last 3 days. Chest pain, chest tightness worse in last 3 days.   No hx of asthma. Get bronchitis a lot in past. Nonsmoker. Uses advair twice a day regularly.    Review of Systems  Constitutional: Positive for fever.  Respiratory: Positive for cough.   Cardiovascular: Positive for chest pain.       Objective:   Physical Exam  Constitutional: Vital signs are normal. She appears well-developed and well-nourished. She is cooperative.  Non-toxic appearance. She does not appear ill. No distress.  HENT:  Head: Normocephalic.  Right Ear: Hearing, tympanic membrane, external ear and ear canal normal. Tympanic membrane is not erythematous, not retracted and not bulging.  Left Ear: Hearing, tympanic membrane, external ear and ear canal normal. Tympanic membrane is not erythematous, not retracted and not bulging.  Nose: Mucosal edema and rhinorrhea present. Right sinus exhibits no maxillary sinus tenderness and no frontal sinus tenderness. Left sinus exhibits no maxillary sinus tenderness and no frontal sinus tenderness.  Mouth/Throat: Uvula is midline, oropharynx is  clear and moist and mucous membranes are normal.  Eyes: Conjunctivae, EOM and lids are normal. Pupils are equal, round, and reactive to light. Lids are everted and swept, no foreign bodies found.  Neck: Trachea normal and normal range of motion. Neck supple. Carotid bruit is not present. No mass and no thyromegaly present.  Cardiovascular: Normal rate, regular rhythm, S1 normal, S2 normal, normal heart sounds, intact distal pulses and normal pulses.  Exam reveals no gallop and no friction rub.   No murmur heard. Pulmonary/Chest: Effort normal and breath sounds normal. Not tachypneic. No respiratory distress. She has no decreased breath sounds. She has no wheezes. She has no rhonchi. She has no rales.  Constant hacky cough  Neurological: She is alert.  Skin: Skin is warm, dry and intact. No rash noted.  Psychiatric: Her speech is normal and behavior is normal. Judgment normal. Her mood appears not anxious. Cognition and memory are normal. She does not exhibit a depressed mood.          Assessment & Plan:

## 2014-01-04 ENCOUNTER — Emergency Department (INDEPENDENT_AMBULATORY_CARE_PROVIDER_SITE_OTHER)
Admission: EM | Admit: 2014-01-04 | Discharge: 2014-01-04 | Disposition: A | Discharge status: Home / Self Care | Payer: Managed Care, Other (non HMO) | Source: Home / Self Care | Attending: Emergency Medicine | Admitting: Emergency Medicine

## 2014-01-04 ENCOUNTER — Encounter (HOSPITAL_COMMUNITY): Payer: Self-pay | Admitting: Emergency Medicine

## 2014-01-04 DIAGNOSIS — J018 Other acute sinusitis: Secondary | ICD-10-CM

## 2014-01-04 HISTORY — DX: Unspecified chronic bronchitis: J42

## 2014-01-04 LAB — POCT RAPID STREP A: Streptococcus, Group A Screen (Direct): NEGATIVE

## 2014-01-04 MED ORDER — MOXIFLOXACIN HCL 400 MG PO TABS: 400.0000 mg | ORAL_TABLET | Freq: Every day | ORAL | Status: DC

## 2014-01-04 NOTE — ED Provider Notes (Signed)
CSN: [id number]     Arrival date & time 01/04/14  0913 History   First MD Initiated Contact with Patient 01/04/14 [phone]     Chief Complaint  Patient presents with  . Sore Throat  . Otalgia   (Consider location/radiation/quality/duration/timing/severity/associated sxs/prior Treatment) HPI She is a 38 year old woman here for evaluation of left-sided throat and ear pain. She states about 3 days ago she noted an abrasion on her left lower inner gum line. Later that day, she developed pain on the left side of her throat, particularly with swallowing. Over the next 2 days the pain spread to her ear or and her left maxillary sinus. She denies any fevers or chills. Denies any vomiting. Denies any swelling in her mouth or throat. Denies any difficulty swallowing. Denies any dental pain.  Past Medical History  Diagnosis Date  . Allergy   . Sinus problem     chronic  . Depression   . Chronic bronchitis    Past Surgical History  Procedure Laterality Date  . Cesarean section      x2  . Knee surgery     No family history on file. History  Substance Use Topics  . Smoking status: Never Smoker   . Smokeless tobacco: Never Used  . Alcohol Use: Yes     Comment: occasional   OB History   Grav Para Term Preterm Abortions TAB SAB Ect Mult Living                 Review of Systems  Constitutional: Negative.   HENT: Positive for congestion (left maxillary sinus), ear pain and sore throat. Negative for trouble swallowing.   Respiratory: Negative.   Gastrointestinal: Negative.   Neurological: Negative.     Allergies  Amoxicillin; Codeine; and Erythromycin  Home Medications   Prior to Admission medications   Medication Sig Start Date End Date Taking? Authorizing Provider  buPROPion (WELLBUTRIN XL) 300 MG 24 hr tablet Take 300 mg by mouth daily.     Yes Historical Provider, MD  cetirizine (ZYRTEC) 10 MG tablet Take 10 mg by mouth daily.     Yes Historical Provider, MD  Eszopiclone 3 MG TABS  Take 3 mg by mouth at bedtime. Take immediately before bedtime   Yes Historical Provider, MD  Fluticasone-Salmeterol (ADVAIR DISKUS) 100-50 MCG/DOSE AEPB Inhale 1 puff into the lungs 2 (two) times daily.   Yes Historical Provider, MD  albuterol (PROVENTIL HFA;VENTOLIN HFA) 108 (90 BASE) MCG/ACT inhaler Inhale 2 puffs into the lungs every 4 (four) hours as needed for wheezing or shortness of breath. 10/15/13   Amy Michelle Nasuti, MD  Calcium-Vitamin D-Vitamin K (CHEWABLE CALCIUM) 500-200-40 MG-UNT-MCG CHEW Chew by mouth as directed.      Historical Provider, MD  doxycycline (VIBRA-TABS) 100 MG tablet Take 1 tablet (100 mg total) by mouth 2 (two) times daily. 10/08/13   Nicki Reaper, NP  fluticasone (FLONASE) 50 MCG/ACT nasal spray Place 1 spray into both nostrils 2 (two) times daily.    Historical Provider, MD  mometasone (NASONEX) 50 MCG/ACT nasal spray Place 2 sprays into the nose daily.    Historical Provider, MD  moxifloxacin (AVELOX) 400 MG tablet Take 1 tablet (400 mg total) by mouth daily at 8 pm. 01/04/14   Charm Rings, MD  tamsulosin (FLOMAX) 0.4 MG CAPS capsule Take 0.4 mg by mouth as needed.    Historical Provider, MD   BP 154/93  Pulse 84  Temp(Src) 98.4 F (36.9 C) (Oral)  Resp 16  SpO2 99%  LMP 12/31/2013 Physical Exam  Constitutional: She is oriented to person, place, and time. She appears well-developed and well-nourished. No distress.  HENT:  Head: Normocephalic and atraumatic.  Right Ear: External ear normal.  Left Ear: Tympanic membrane normal. There is tenderness (behind the ear). No drainage or swelling.  No middle ear effusion.  Nose: Nose normal.  Mouth/Throat: Mucous membranes are normal. Posterior oropharyngeal erythema present. No oropharyngeal exudate or posterior oropharyngeal edema.    Neck: Normal range of motion.  Cardiovascular: Normal rate.   Pulmonary/Chest: Effort normal.  Lymphadenopathy:    She has cervical adenopathy (left posterior chain).  Neurological:  She is alert and oriented to person, place, and time.  Skin: Skin is warm and dry. No rash noted.    ED Course  Procedures (including critical care time) Labs Review Labs Reviewed  POCT RAPID STREP A ([facility] CARE ONLY)    Imaging Review No results found.   MDM   1. Other acute sinusitis    Rapid strep is negative. I suspect she is a developing infection of her gumline versus sinuses. She is allergic to penicillins and erythromycin. Will treat with Avelox for 7 days. Recommended Tylenol and Motrin for pain, as well as heat and ice packs. Continue salt water gargles. Reviewed reasons to return, including swelling of the mouth, tongue, gums, fevers, vomiting. Expect improvement in 24-48 hours. Followup as needed.    Charm RingsErin J Honig, MD 01/04/14 [phone]

## 2014-01-04 NOTE — Discharge Instructions (Signed)
It looks like you have an infection brewing in your gums and sinuses. Take Avelox 1 pill daily for 7 days. Alternate tylenol and motrin for pain. Alternate heat and ice to the jaw for pain. Continue the salt water gargles.  You should start to improve 24-48 hours after starting the antibiotics. If you develop swelling of your mouth or gums, fevers, vomiting, or are just not getting better, please come back or see your regular doctor.

## 2014-01-04 NOTE — ED Notes (Signed)
Started with lesion to left lower posterior inner gums 3 days ago, then started with "searing" left sore throat and ear pain with lymphadenopathy on left side.  C/O difficulty eating and talking due to pain.  Also has left maxillary sinus congestion this morning and slight cough.  Denies any fevers.  Has been taking Tyl and salt water gargles.

## 2014-01-06 LAB — CULTURE, GROUP A STREP

## 2014-02-28 ENCOUNTER — Ambulatory Visit: Payer: Self-pay | Admitting: Unknown Physician Specialty

## 2014-05-28 ENCOUNTER — Ambulatory Visit (INDEPENDENT_AMBULATORY_CARE_PROVIDER_SITE_OTHER): Payer: Managed Care, Other (non HMO) | Admitting: Family Medicine

## 2014-05-28 ENCOUNTER — Encounter: Payer: Self-pay | Admitting: Family Medicine

## 2014-05-28 ENCOUNTER — Encounter: Payer: Self-pay | Admitting: *Deleted

## 2014-05-28 VITALS — BP 162/86 | HR 87 | Temp 98.6°F | Wt 133.5 lb

## 2014-05-28 DIAGNOSIS — R5383 Other fatigue: Secondary | ICD-10-CM

## 2014-05-28 DIAGNOSIS — Z131 Encounter for screening for diabetes mellitus: Secondary | ICD-10-CM

## 2014-05-28 DIAGNOSIS — I10 Essential (primary) hypertension: Secondary | ICD-10-CM

## 2014-05-28 DIAGNOSIS — Z13220 Encounter for screening for lipoid disorders: Secondary | ICD-10-CM

## 2014-05-28 DIAGNOSIS — J45909 Unspecified asthma, uncomplicated: Secondary | ICD-10-CM | POA: Insufficient documentation

## 2014-05-28 HISTORY — DX: Essential (primary) hypertension: I10

## 2014-05-28 LAB — HEPATIC FUNCTION PANEL
ALT: 12 U/L (ref 0–35)
AST: 16 U/L (ref 0–37)
Albumin: 4.2 g/dL (ref 3.5–5.2)
Alkaline Phosphatase: 56 U/L (ref 39–117)
BILIRUBIN TOTAL: 0.5 mg/dL (ref 0.2–1.2)
Bilirubin, Direct: 0 mg/dL (ref 0.0–0.3)
Total Protein: 6.8 g/dL (ref 6.0–8.3)

## 2014-05-28 LAB — CBC WITH DIFFERENTIAL/PLATELET
BASOS ABS: 0 10*3/uL (ref 0.0–0.1)
Basophils Relative: 0.6 % (ref 0.0–3.0)
Eosinophils Absolute: 0 10*3/uL (ref 0.0–0.7)
Eosinophils Relative: 0.7 % (ref 0.0–5.0)
HCT: 38.9 % (ref 36.0–46.0)
Hemoglobin: 13.1 g/dL (ref 12.0–15.0)
LYMPHS PCT: 25.9 % (ref 12.0–46.0)
Lymphs Abs: 1.6 10*3/uL (ref 0.7–4.0)
MCHC: 33.8 g/dL (ref 30.0–36.0)
MCV: 88.3 fl (ref 78.0–100.0)
MONOS PCT: 7.2 % (ref 3.0–12.0)
Monocytes Absolute: 0.4 10*3/uL (ref 0.1–1.0)
NEUTROS ABS: 4 10*3/uL (ref 1.4–7.7)
Neutrophils Relative %: 65.6 % (ref 43.0–77.0)
Platelets: 246 10*3/uL (ref 150.0–400.0)
RBC: 4.41 Mil/uL (ref 3.87–5.11)
RDW: 12.5 % (ref 11.5–15.5)
WBC: 6 10*3/uL (ref 4.0–10.5)

## 2014-05-28 LAB — TSH: TSH: 2.21 u[IU]/mL (ref 0.35–4.50)

## 2014-05-28 LAB — BASIC METABOLIC PANEL
BUN: 14 mg/dL (ref 6–23)
CHLORIDE: 106 meq/L (ref 96–112)
CO2: 27 meq/L (ref 19–32)
Calcium: 8.7 mg/dL (ref 8.4–10.5)
Creatinine, Ser: 0.9 mg/dL (ref 0.4–1.2)
GFR: 70.72 mL/min (ref >60.00)
Glucose, Bld: 89 mg/dL (ref 70–99)
Potassium: 3.6 mEq/L (ref 3.5–5.1)
SODIUM: 140 meq/L (ref 135–145)

## 2014-05-28 LAB — LIPID PANEL
Cholesterol: 211 mg/dL — ABNORMAL HIGH (ref 0–200)
HDL: 48.3 mg/dL (ref >39.00)
LDL Cholesterol: 146 mg/dL — ABNORMAL HIGH (ref 0–99)
NonHDL: 162.7
Total CHOL/HDL Ratio: 4
Triglycerides: 86 mg/dL (ref 0.0–149.0)
VLDL: 17.2 mg/dL (ref 0.0–40.0)

## 2014-05-28 NOTE — Progress Notes (Signed)
Pre visit review using our clinic review tool, if applicable. No additional management support is needed unless otherwise documented below in the visit note. 

## 2014-05-28 NOTE — Progress Notes (Signed)
Dr. Karleen Hampshire T. Remijio Holleran, MD, CAQ [facility] [address] [place], [address] Phone: [phone] Fax: [phone]  05/28/2014  Patient: Shelley Figueroa, MRN: [id number], DOB: [birth year], 38 y.o.  Primary Physician:  Hannah Beat, MD  Chief Complaint: Hypertension  Subjective:   Shelley Figueroa is a 38 y.o. very pleasant female patient who presents with the following:  Could feel it an in her chest. Felt a little tight.  Caffeine - coffee in the Am and 2 cokes.  Not exercising.   Wt Readings from Last 3 Encounters:  05/28/14 133 lb 8 oz (60.555 kg)  10/15/13 131 lb (59.421 kg)  10/07/13 130 lb 4 oz (59.081 kg)    142/88  BP Readings from Last 3 Encounters:  05/28/14 162/86  01/04/14 154/93  10/15/13 130/86    Shelley Figueroa is a patient who I know very well who presents with ongoing elevated blood pressures, and she has had multiple including multiple at a recent appointment with her allergist.  He recommended that she come in and be evaluated in our office.  She has been feeling some pressure in her chest and in her head when her blood pressure is high.  She weighs only 133 pounds.  She has historically been quite active, but she really has not been exercising at all over the last couple of years and she works 14-15 hour days.  She is on the road sometimes weeks at a time.  She does have a very stressful job also.  No significant substance use.  Past Medical History, Surgical History, Social History, Family History, Problem List, Medications, and Allergies have been reviewed and updated if relevant.   GEN: No acute illnesses, no fevers, chills. GI: No n/v/d, eating normally Pulm: No SOB Interactive and getting along well at home.  Otherwise, ROS is as per the HPI.  Objective:   BP 162/86 mmHg  Pulse 87  Temp(Src) 98.6 F (37 C) (Oral)  Wt 133 lb 8 oz (60.555 kg)  SpO2 97%  GEN: WDWN, NAD, Non-toxic, A & O x  3 HEENT: Atraumatic, Normocephalic. Neck supple. No masses, No LAD. Ears and Nose: No external deformity. CV: RRR, No M/G/R. No JVD. No thrill. No extra heart sounds. PULM: CTA B, no wheezes, crackles, rhonchi. No retractions. No resp. distress. No accessory muscle use. EXTR: No c/c/e NEURO Normal gait.  PSYCH: Normally interactive. Conversant. Not depressed or anxious appearing.  Calm demeanor.   Laboratory and Imaging Data: Results for orders placed or performed in visit on 05/28/14  Basic metabolic panel  Result Value Ref Range   Sodium 140 135 - 145 mEq/L   Potassium 3.6 3.5 - 5.1 mEq/L   Chloride 106 96 - 112 mEq/L   CO2 27 19 - 32 mEq/L   Glucose, Bld 89 70 - 99 mg/dL   BUN 14 6 - 23 mg/dL   Creatinine, Ser 0.9 0.4 - 1.2 mg/dL   Calcium 8.7 8.4 - 86.5 mg/dL   GFR 78.46 >96.29 mL/min  CBC with Differential  Result Value Ref Range   WBC 6.0 4.0 - 10.5 K/uL   RBC 4.41 3.87 - 5.11 Mil/uL   Hemoglobin 13.1 12.0 - 15.0 g/dL   HCT 52.8 41.3 - 24.4 %   MCV 88.3 78.0 - 100.0 fl   MCHC 33.8 30.0 - 36.0 g/dL   RDW 01.0 27.2 - 53.6 %   Platelets 246.0 150.0 - 400.0 K/uL   Neutrophils Relative %  65.6 43.0 - 77.0 %   Lymphocytes Relative 25.9 12.0 - 46.0 %   Monocytes Relative 7.2 3.0 - 12.0 %   Eosinophils Relative 0.7 0.0 - 5.0 %   Basophils Relative 0.6 0.0 - 3.0 %   Neutro Abs 4.0 1.4 - 7.7 K/uL   Lymphs Abs 1.6 0.7 - 4.0 K/uL   Monocytes Absolute 0.4 0.1 - 1.0 K/uL   Eosinophils Absolute 0.0 0.0 - 0.7 K/uL   Basophils Absolute 0.0 0.0 - 0.1 K/uL  Hepatic function panel  Result Value Ref Range   Total Bilirubin 0.5 0.2 - 1.2 mg/dL   Bilirubin, Direct 0.0 0.0 - 0.3 mg/dL   Alkaline Phosphatase 56 39 - 117 U/L   AST 16 0 - 37 U/L   ALT 12 0 - 35 U/L   Total Protein 6.8 6.0 - 8.3 g/dL   Albumin 4.2 3.5 - 5.2 g/dL  TSH  Result Value Ref Range   TSH 2.21 0.35 - 4.50 uIU/mL  Lipid panel  Result Value Ref Range   Cholesterol 211 (H) 0 - 200 mg/dL   Triglycerides 82.986.0 0.0  - 149.0 mg/dL   HDL 56.2148.30 >30.86>39.00 mg/dL   VLDL 57.817.2 0.0 - 46.940.0 mg/dL   LDL Cholesterol 629146 (H) 0 - 99 mg/dL   Total CHOL/HDL Ratio 4    NonHDL 162.70      Assessment and Plan:   Essential hypertension  Screening, lipid - Plan: Lipid panel  Screening for diabetes mellitus - Plan: Basic metabolic panel  Other fatigue - Plan: CBC with Differential, Hepatic function panel, TSH  >25 minutes spent in face to face time with patient, >50% spent in counselling or coordination of care  At this point she is not ready to go on any kind of medications.  She has been eating very poorly and she is not exercising at all.  A lot of this may have to do with her lifestyle as a very busy working person.  She is going to try for the next 3 or 4 months to improve her lifestyle, sleep, eating patterns and exercise and follow back up.  Also recommended that she get a blood pressure cuff.  Lipids are higher than anticipated also.  Doing all of the above would likely improve her lipid profile.  Follow-up: 4 mo  New Prescriptions   No medications on file   Orders Placed This Encounter  Procedures  . Basic metabolic panel  . CBC with Differential  . Hepatic function panel  . TSH  . Lipid panel    Signed,  Karleen HampshireSpencer T. Windle Huebert, MD   Patient's Medications  New Prescriptions   No medications on file  Previous Medications   ALBUTEROL (PROVENTIL HFA;VENTOLIN HFA) 108 (90 BASE) MCG/ACT INHALER    Inhale 2 puffs into the lungs every 4 (four) hours as needed for wheezing or shortness of breath.   BUPROPION (WELLBUTRIN XL) 300 MG 24 HR TABLET    Take 300 mg by mouth daily.     CETIRIZINE (ZYRTEC) 10 MG TABLET    Take 10 mg by mouth daily.     ESZOPICLONE 3 MG TABS    Take 3 mg by mouth at bedtime. Take immediately before bedtime   FLUTICASONE-SALMETEROL (ADVAIR DISKUS) 100-50 MCG/DOSE AEPB    Inhale 1 puff into the lungs 2 (two) times daily.  Modified Medications   No medications on file  Discontinued  Medications   CALCIUM-VITAMIN D-VITAMIN K (CHEWABLE CALCIUM) 500-200-40 MG-UNT-MCG CHEW    Chew  by mouth as directed.     DOXYCYCLINE (VIBRA-TABS) 100 MG TABLET    Take 1 tablet (100 mg total) by mouth 2 (two) times daily.   FLUTICASONE (FLONASE) 50 MCG/ACT NASAL SPRAY    Place 1 spray into both nostrils 2 (two) times daily.   MOMETASONE (NASONEX) 50 MCG/ACT NASAL SPRAY    Place 2 sprays into the nose daily.   MOXIFLOXACIN (AVELOX) 400 MG TABLET    Take 1 tablet (400 mg total) by mouth daily at 8 pm.   TAMSULOSIN (FLOMAX) 0.4 MG CAPS CAPSULE    Take 0.4 mg by mouth as needed.

## 2014-05-29 ENCOUNTER — Telehealth: Payer: Self-pay | Admitting: Family Medicine

## 2014-05-29 NOTE — Telephone Encounter (Signed)
emmi mailed  °

## 2014-07-05 ENCOUNTER — Emergency Department (INDEPENDENT_AMBULATORY_CARE_PROVIDER_SITE_OTHER): Payer: Managed Care, Other (non HMO)

## 2014-07-05 ENCOUNTER — Encounter (HOSPITAL_COMMUNITY): Payer: Self-pay | Admitting: *Deleted

## 2014-07-05 ENCOUNTER — Emergency Department (INDEPENDENT_AMBULATORY_CARE_PROVIDER_SITE_OTHER)
Admission: EM | Admit: 2014-07-05 | Discharge: 2014-07-05 | Disposition: A | Discharge status: Home / Self Care | Payer: Managed Care, Other (non HMO) | Source: Home / Self Care | Attending: Family Medicine | Admitting: Family Medicine

## 2014-07-05 DIAGNOSIS — K1379 Other lesions of oral mucosa: Secondary | ICD-10-CM | POA: Diagnosis not present

## 2014-07-05 DIAGNOSIS — J3489 Other specified disorders of nose and nasal sinuses: Secondary | ICD-10-CM

## 2014-07-05 DIAGNOSIS — B349 Viral infection, unspecified: Secondary | ICD-10-CM

## 2014-07-05 DIAGNOSIS — J45909 Unspecified asthma, uncomplicated: Secondary | ICD-10-CM

## 2014-07-05 HISTORY — DX: Calculus of kidney: N20.0

## 2014-07-05 LAB — POCT RAPID STREP A: Streptococcus, Group A Screen (Direct): NEGATIVE

## 2014-07-05 MED ORDER — IBUPROFEN 800 MG PO TABS
ORAL_TABLET | ORAL | Status: AC
  Filled 2014-07-05: qty 1

## 2014-07-05 MED ORDER — PREDNISONE 50 MG PO TABS: ORAL_TABLET | ORAL | Status: DC

## 2014-07-05 MED ORDER — SODIUM CHLORIDE 0.9 % IN NEBU
INHALATION_SOLUTION | RESPIRATORY_TRACT | Status: AC
  Filled 2014-07-05: qty 3

## 2014-07-05 MED ORDER — SPACER/AERO-HOLDING CHAMBERS DEVI: Status: DC

## 2014-07-05 MED ORDER — IPRATROPIUM BROMIDE 0.06 % NA SOLN: 2.0000 | Freq: Four times a day (QID) | NASAL | Status: DC

## 2014-07-05 MED ORDER — IBUPROFEN 800 MG PO TABS
800.0000 mg | ORAL_TABLET | Freq: Once | ORAL | Status: AC
  Administered 2014-07-05: 800 mg via ORAL

## 2014-07-05 MED ORDER — IPRATROPIUM-ALBUTEROL 0.5-2.5 (3) MG/3ML IN SOLN
RESPIRATORY_TRACT | Status: AC
  Filled 2014-07-05: qty 3

## 2014-07-05 MED ORDER — IPRATROPIUM-ALBUTEROL 0.5-2.5 (3) MG/3ML IN SOLN
3.0000 mL | Freq: Once | RESPIRATORY_TRACT | Status: AC
  Administered 2014-07-05: 3 mL via RESPIRATORY_TRACT

## 2014-07-05 NOTE — ED Notes (Signed)
10 days ago started with cough which turned into bronchitis - was given 3-day Z-pak, 5 days of Prednisone, and an albuterol inhaler.  Felt completely better 4 days ago.  2 days ago started with sore throat, cough.  Cough now getting very "tight" - chest hurts, starting to get body aches.  Has been using throat lozenges and salt water gargles.

## 2014-07-05 NOTE — ED Notes (Signed)
Breathing treatment in progress

## 2014-07-05 NOTE — Discharge Instructions (Signed)
Because of your symptoms is not immediately clear, but is likely related to a viral infection. These start using the nasal Atrovent and Flonase at night. Please start using ibuprofen 600 mg every 4-6 hours. Please use the spacer with your albuterol inhaler every 4-6 hours. Please consider using the prednisone if your symptoms do not improve, but stopped this if you get no benefit with the prednisone. We will culture your strep test and that she know if you need further antibiotic therapy to treat strep throat. Please call our office for further instructions on Monday morning after 9 AM if you have not improved or if you get worse.

## 2014-07-05 NOTE — ED Notes (Signed)
Breathing treatment complete.  States feeling much better. 

## 2014-07-05 NOTE — ED Provider Notes (Signed)
CSN: [id number]     Arrival date & time 07/05/14  1415 History   First MD Initiated Contact with Patient 07/05/14 1505     Chief Complaint  Patient presents with  . Sore Throat  . Cough   (Consider location/radiation/quality/duration/timing/severity/associated sxs/prior Treatment) HPI  Sore throat: started 2 days ago. Numerous sick contacts. Bronchitis 2 wks ago. And started on Zpack and Prednisone which completed 1 week ago. Symptoms resolved until 2 days ago. Associated w/ body aches. Associated w/ runny nose and cough. Intermittenly productive. Associated w/ SOB in upper chest w/ deep breathing. Denies CP, palpitations, nausea, vomiting.      Past Medical History  Diagnosis Date  . Allergy   . Sinus problem     chronic  . Depression   . Chronic bronchitis   . Asthma, chronic 05/28/2014  . Seasonal allergies   . Kidney stones   . Essential hypertension 05/28/2014   Past Surgical History  Procedure Laterality Date  . Cesarean section      x2  . Knee surgery    . Septoplasty    . Nasal sinus surgery     Family History  Problem Relation Age of Onset  . Hypertension Mother   . Hyperlipidemia Mother   . Hypertension Father   . Hyperlipidemia Father    History  Substance Use Topics  . Smoking status: Never Smoker   . Smokeless tobacco: Never Used  . Alcohol Use: Yes     Comment: occasional   OB History    No data available     Review of Systems Per HPI with all other pertinent systems negative.   Allergies  Amoxicillin; Codeine; Erythromycin; and Penicillins  Home Medications   Prior to Admission medications   Medication Sig Start Date End Date Taking? Authorizing Provider  albuterol (PROVENTIL HFA;VENTOLIN HFA) 108 (90 BASE) MCG/ACT inhaler Inhale 2 puffs into the lungs every 4 (four) hours as needed for wheezing or shortness of breath. 10/15/13  Yes Amy E Ermalene Searing, MD  buPROPion (WELLBUTRIN XL) 300 MG 24 hr tablet Take 300 mg by mouth daily.     Yes  Historical Provider, MD  cetirizine (ZYRTEC) 10 MG tablet Take 10 mg by mouth daily.     Yes Historical Provider, MD  Eszopiclone 3 MG TABS Take 3 mg by mouth at bedtime. Take immediately before bedtime   Yes Historical Provider, MD  Fluticasone-Salmeterol (ADVAIR DISKUS) 100-50 MCG/DOSE AEPB Inhale 1 puff into the lungs 2 (two) times daily.   Yes Historical Provider, MD  GUAIFENESIN PO Take by mouth.   Yes Historical Provider, MD  L-Lysine HCl 1000 MG TABS Take by mouth daily.   Yes Historical Provider, MD  ipratropium (ATROVENT) 0.06 % nasal spray Place 2 sprays into both nostrils 4 (four) times daily. 07/05/14   Ozella Rocks, MD  predniSONE (DELTASONE) 50 MG tablet Take daily with breakfast 07/05/14   Ozella Rocks, MD  Spacer/[facility] Use as directed 07/05/14   Ozella Rocks, MD   BP 146/95 mmHg  Pulse 99  Temp(Src) 98.2 F (36.8 C) (Oral)  Resp 20  SpO2 99% Physical Exam  Constitutional: She appears well-developed and well-nourished.  HENT:  Numerous erythematous spots in posterior oral cavity. Tonsils normal, pharyngeal cobblestoning. Numerous small mucous filled pin-sized lesions on lower lip. No ulcerations of the lip or oral cavity.  Eyes: EOM are normal. Pupils are equal, round, and reactive to light.  Neck: Normal range of motion.  Cardiovascular: Normal  rate and normal heart sounds.   Pulmonary/Chest: Effort normal and breath sounds normal.  Abdominal: Soft.  Musculoskeletal: Normal range of motion.  Neurological: She is alert.  Skin: Skin is warm and dry.  No rash on hands or feet.  Psychiatric: She has a normal mood and affect. Her behavior is normal. Judgment and thought content normal.    ED Course  Procedures (including critical care time) Labs Review Labs Reviewed  CULTURE, GROUP A STREP  POCT RAPID STREP A ([facility] ONLY)    Imaging Review Dg Chest 2 View  07/05/2014   CLINICAL DATA:  Cough with chest tightness and sore throat.  Diagnosed with bronchitis 10 days ago.  EXAM: CHEST  2 VIEW  COMPARISON:  10/27/2009  FINDINGS: The heart size and mediastinal contours are within normal limits. Both lungs are clear. The visualized skeletal structures are unremarkable.  IMPRESSION: No active cardiopulmonary disease.   Electronically Signed   By: Elberta Fortisaniel  Boyle M.D.   On: 07/05/2014 16:11     MDM   1. Asthma, unspecified asthma severity, uncomplicated   2. Viral illness   3. Rhinorrhea   4. Mucocele of mouth    Etiology of symptoms unclear. Chest x-ray negative for pneumonia or other acute process. Favor viral etiology at this point in time, possibly early onset hand-foot-and-mouth. Rapid strep negative but will culture. Some benefit after DuoNeb here in office. Start nasal Atrovent and Flonase with ibuprofen 600 mg every 6 hours. Prednisone given if develops any wheezing or shortness of breath. Continue with albuterol every 6 hours for the next 24-48 hours Patient to call back on Monday if symptoms are not improved or worsens. Patient's completion of Z-Pak a few days ago needs the medicine is still probably in her system. Precautions given and all questions answered  Shelly Flattenavid Elexa Kivi, MD Family Medicine 07/05/2014, 4:44 PM      Ozella Rocksavid J Siegfried Vieth, MD 07/05/14 [phone]

## 2014-07-07 LAB — CULTURE, GROUP A STREP

## 2014-07-20 ENCOUNTER — Encounter (HOSPITAL_COMMUNITY): Payer: Self-pay | Admitting: *Deleted

## 2014-07-20 ENCOUNTER — Emergency Department (HOSPITAL_COMMUNITY)
Admission: EM | Admit: 2014-07-20 | Discharge: 2014-07-20 | Disposition: A | Discharge status: Home / Self Care | Payer: Managed Care, Other (non HMO) | Attending: Emergency Medicine | Admitting: Emergency Medicine

## 2014-07-20 ENCOUNTER — Emergency Department (HOSPITAL_COMMUNITY): Payer: Managed Care, Other (non HMO)

## 2014-07-20 DIAGNOSIS — R079 Chest pain, unspecified: Secondary | ICD-10-CM

## 2014-07-20 DIAGNOSIS — M79604 Pain in right leg: Secondary | ICD-10-CM | POA: Diagnosis not present

## 2014-07-20 DIAGNOSIS — Z79899 Other long term (current) drug therapy: Secondary | ICD-10-CM | POA: Insufficient documentation

## 2014-07-20 DIAGNOSIS — Z880 Allergy status to penicillin: Secondary | ICD-10-CM | POA: Diagnosis not present

## 2014-07-20 DIAGNOSIS — J45901 Unspecified asthma with (acute) exacerbation: Secondary | ICD-10-CM | POA: Insufficient documentation

## 2014-07-20 DIAGNOSIS — F329 Major depressive disorder, single episode, unspecified: Secondary | ICD-10-CM | POA: Insufficient documentation

## 2014-07-20 DIAGNOSIS — Z7952 Long term (current) use of systemic steroids: Secondary | ICD-10-CM | POA: Insufficient documentation

## 2014-07-20 DIAGNOSIS — Z7951 Long term (current) use of inhaled steroids: Secondary | ICD-10-CM | POA: Diagnosis not present

## 2014-07-20 DIAGNOSIS — R0602 Shortness of breath: Secondary | ICD-10-CM

## 2014-07-20 DIAGNOSIS — I10 Essential (primary) hypertension: Secondary | ICD-10-CM | POA: Diagnosis not present

## 2014-07-20 LAB — BASIC METABOLIC PANEL
ANION GAP: 7 (ref 5–15)
BUN: 17 mg/dL (ref 6–23)
CHLORIDE: 102 mmol/L (ref 96–112)
CO2: 30 mmol/L (ref 19–32)
Calcium: 9.1 mg/dL (ref 8.4–10.5)
Creatinine, Ser: 1.07 mg/dL (ref 0.50–1.10)
GFR calc Af Amer: 74 mL/min — ABNORMAL LOW (ref >90)
GFR, EST NON AFRICAN AMERICAN: 64 mL/min — AB (ref >90)
Glucose, Bld: 96 mg/dL (ref 70–99)
POTASSIUM: 3.1 mmol/L — AB (ref 3.5–5.1)
SODIUM: 139 mmol/L (ref 135–145)

## 2014-07-20 LAB — CBC
HCT: 39.9 % (ref 36.0–46.0)
Hemoglobin: 13.8 g/dL (ref 12.0–15.0)
MCH: 29.4 pg (ref 26.0–34.0)
MCHC: 34.6 g/dL (ref 30.0–36.0)
MCV: 85.1 fL (ref 78.0–100.0)
Platelets: 313 10*3/uL (ref 150–400)
RBC: 4.69 MIL/uL (ref 3.87–5.11)
RDW: 13.2 % (ref 11.5–15.5)
WBC: 9.2 10*3/uL (ref 4.0–10.5)

## 2014-07-20 LAB — D-DIMER, QUANTITATIVE (NOT AT ARMC)

## 2014-07-20 LAB — I-STAT TROPONIN, ED: Troponin i, poc: 0 ng/mL (ref 0.00–0.08)

## 2014-07-20 LAB — TROPONIN I: Troponin I: <0.03 ng/mL (ref <0.031)

## 2014-07-20 MED ORDER — NAPROXEN 500 MG PO TABS: 500.0000 mg | ORAL_TABLET | Freq: Two times a day (BID) | ORAL | Status: DC

## 2014-07-20 MED ORDER — ONDANSETRON HCL 4 MG/2ML IJ SOLN
4.0000 mg | Freq: Once | INTRAMUSCULAR | Status: AC
  Administered 2014-07-20: 4 mg via INTRAVENOUS
  Filled 2014-07-20: qty 2

## 2014-07-20 MED ORDER — MORPHINE SULFATE 4 MG/ML IJ SOLN
4.0000 mg | Freq: Once | INTRAMUSCULAR | Status: AC
  Administered 2014-07-20: 4 mg via INTRAVENOUS
  Filled 2014-07-20: qty 1

## 2014-07-20 MED ORDER — KETOROLAC TROMETHAMINE 30 MG/ML IJ SOLN
30.0000 mg | Freq: Once | INTRAMUSCULAR | Status: AC
  Administered 2014-07-20: 30 mg via INTRAVENOUS
  Filled 2014-07-20: qty 1

## 2014-07-20 MED ORDER — ACETAMINOPHEN 325 MG PO TABS
650.0000 mg | ORAL_TABLET | Freq: Once | ORAL | Status: AC
  Administered 2014-07-20: 650 mg via ORAL
  Filled 2014-07-20: qty 2

## 2014-07-20 MED ORDER — LORAZEPAM 2 MG/ML IJ SOLN
1.0000 mg | Freq: Once | INTRAMUSCULAR | Status: AC
  Administered 2014-07-20: 1 mg via INTRAVENOUS
  Filled 2014-07-20: qty 1

## 2014-07-20 MED ORDER — ASPIRIN 81 MG PO CHEW
324.0000 mg | CHEWABLE_TABLET | Freq: Once | ORAL | Status: AC
  Administered 2014-07-20: 324 mg via ORAL
  Filled 2014-07-20: qty 4

## 2014-07-20 NOTE — ED Notes (Signed)
Rob, PA at the bedside.  

## 2014-07-20 NOTE — ED Notes (Signed)
Rob, PA back at the bedside.

## 2014-07-20 NOTE — Discharge Instructions (Signed)

## 2014-07-20 NOTE — Progress Notes (Signed)
VASCULAR LAB PRELIMINARY  PRELIMINARY  PRELIMINARY  PRELIMINARY  Bilateral lower extremity venous Doppler completed.    Preliminary report:  There is no DVT or SVT noted in the right lower extremity.  Shelley Figueroa, RVT 07/20/2014, 7:16 PM

## 2014-07-20 NOTE — ED Provider Notes (Signed)
CSN: [id number]     Arrival date & time 07/20/14  1700 History   First MD Initiated Contact with Patient 07/20/14 1757     Chief Complaint  Patient presents with  . Chest Pain     (Consider location/radiation/quality/duration/timing/severity/associated sxs/prior Treatment) HPI Comments: Patient presents to the emergency department with chief complaint of right-sided chest pain with associated shortness of breath. She states that the symptoms been progressively worsening for the past 3 days. She states that she flies weekly, and recently returned from a trip to [place]. It feels like she has to take a deep breath. She reports pain that radiates to her right arm. She also endorses some right lower extremity calf tenderness. There are no aggravating or alleviating factors. Patient denies any fever, cough, nausea, or vomiting. Cardiac risk factors include hypertension only.  The history is provided by the patient. No language interpreter was used.    Past Medical History  Diagnosis Date  . Allergy   . Sinus problem     chronic  . Depression   . Chronic bronchitis   . Asthma, chronic 05/28/2014  . Seasonal allergies   . Kidney stones   . Essential hypertension 05/28/2014   Past Surgical History  Procedure Laterality Date  . Cesarean section      x2  . Knee surgery    . Septoplasty    . Nasal sinus surgery     Family History  Problem Relation Age of Onset  . Hypertension Mother   . Hyperlipidemia Mother   . Hypertension Father   . Hyperlipidemia Father    History  Substance Use Topics  . Smoking status: Never Smoker   . Smokeless tobacco: Never Used  . Alcohol Use: Yes     Comment: occasional   OB History    No data available     Review of Systems  Constitutional: Negative for fever and chills.  Respiratory: Positive for shortness of breath.   Cardiovascular: Positive for chest pain.  Gastrointestinal: Negative for nausea, vomiting, diarrhea and constipation.   Genitourinary: Negative for dysuria.  All other systems reviewed and are negative.     Allergies  Amoxicillin; Codeine; Erythromycin; and Penicillins  Home Medications   Prior to Admission medications   Medication Sig Start Date End Date Taking? Authorizing Provider  albuterol (PROVENTIL HFA;VENTOLIN HFA) 108 (90 BASE) MCG/ACT inhaler Inhale 2 puffs into the lungs every 4 (four) hours as needed for wheezing or shortness of breath. 10/15/13   Amy Michelle NasutiE Bedsole, MD  buPROPion (WELLBUTRIN XL) 300 MG 24 hr tablet Take 300 mg by mouth daily.      Historical Provider, MD  cetirizine (ZYRTEC) 10 MG tablet Take 10 mg by mouth daily.      Historical Provider, MD  Eszopiclone 3 MG TABS Take 3 mg by mouth at bedtime. Take immediately before bedtime    Historical Provider, MD  Fluticasone-Salmeterol (ADVAIR DISKUS) 100-50 MCG/DOSE AEPB Inhale 1 puff into the lungs 2 (two) times daily.    Historical Provider, MD  GUAIFENESIN PO Take by mouth.    Historical Provider, MD  ipratropium (ATROVENT) 0.06 % nasal spray Place 2 sprays into both nostrils 4 (four) times daily. 07/05/14   Ozella Rocksavid J Merrell, MD  L-Lysine HCl 1000 MG TABS Take by mouth daily.    Historical Provider, MD  predniSONE (DELTASONE) 50 MG tablet Take daily with breakfast 07/05/14   Ozella Rocksavid J Merrell, MD  Sp[facility]ero-[facility]olding [facility] DEVI Use as directed 07/05/14   Elmon Elseavid J  Merrell, MD   BP 171/104 mmHg  Pulse 86  Temp(Src) 99.6 F (37.6 C) (Oral)  Resp 20  SpO2 100% Physical Exam  Constitutional: She is oriented to person, place, and time. She appears well-developed and well-nourished.  HENT:  Head: Normocephalic and atraumatic.  Eyes: Conjunctivae and EOM are normal. Pupils are equal, round, and reactive to light.  Neck: Normal range of motion. Neck supple.  Cardiovascular: Normal rate and regular rhythm.  Exam reveals no gallop and no friction rub.   No murmur heard. Pulmonary/Chest: Effort normal and breath sounds normal. No respiratory  distress. She has no wheezes. She has no rales. She exhibits no tenderness.  Abdominal: Soft. Bowel sounds are normal. She exhibits no distension and no mass. There is no tenderness. There is no rebound and no guarding.  Musculoskeletal: Normal range of motion. She exhibits tenderness. She exhibits no edema.  Right calf tenderness to palpation  Neurological: She is alert and oriented to person, place, and time.  Skin: Skin is warm and dry.  Psychiatric: She has a normal mood and affect. Her behavior is normal. Judgment and thought content normal.  Nursing note and vitals reviewed.   ED Course  Procedures (including critical care time) Labs Review Labs Reviewed  BASIC METABOLIC PANEL - Abnormal; Notable for the following:    Potassium 3.1 (*)    GFR calc non Af Amer 64 (*)    GFR calc Af Amer 74 (*)    All other components within normal limits  CBC  TROPONIN I  D-DIMER, QUANTITATIVE    Imaging Review Dg Chest 2 View  07/20/2014   CLINICAL DATA:  Right-sided chest pain for 3 days. History of bronchitis for 2 weeks.  EXAM: CHEST  2 VIEW  COMPARISON:  07/05/2014  FINDINGS: The heart size and mediastinal contours are within normal limits. Both lungs are clear. The visualized skeletal structures are unremarkable.  IMPRESSION: No active cardiopulmonary disease.   Electronically Signed   By: Burman Nieves M.D.   On: 07/20/2014 17:35     EKG Interpretation   Date/Time:  Sunday July 20 2014 17:06:16 EST Ventricular Rate:  87 PR Interval:  144 QRS Duration: 84 QT Interval:  360 QTC Calculation: 433 R Axis:   83 Text Interpretation:  Normal sinus rhythm Nonspecific ST abnormality  Abnormal ECG Confirmed by DELOS  MD, DOUGLAS ([id number]) on 07/20/2014 9:02:23  PM      MDM   Final diagnoses:  Chest pain, unspecified chest pain type   Patient with progressively worsening chest pain and shortness of breath. Symptoms are located on the right several chest. She states that she flies  weekly, and recently returned from [place]. Will check d-dimer to rule out PE. Other labs are reassuring.   Lower extremity Doppler DVT study is negative for DVT. D-dimer is negative. Will check felt troponin. Patient states that she still feels short of breath, but is maintaining 99-100% oxygenation. She states that her pain has resolved.  Pain is resolved. Discharged home with naproxen. Recommend primary care follow-up. They'll troponin negative. Heart score is 2.  Patient, labs, and workup discussed with Dr. Judd Lien who agrees with the plan for discharge.Roxy Horseman, PA-C 07/20/14 2125  Geoffery Lyons, MD 07/20/14 2129

## 2014-07-20 NOTE — ED Notes (Signed)
The pt is c/o rt sidedchest pain for 3 days with sob.  She flies  Frequently and Thursday [place]  [place].  No temp  .  She appears anxious.  Just started on bp med.  lmp none  iud

## 2014-07-21 ENCOUNTER — Encounter: Payer: Self-pay | Admitting: Family Medicine

## 2014-07-21 ENCOUNTER — Ambulatory Visit (INDEPENDENT_AMBULATORY_CARE_PROVIDER_SITE_OTHER): Payer: Managed Care, Other (non HMO) | Admitting: Family Medicine

## 2014-07-21 VITALS — BP 116/76 | HR 80 | Temp 98.6°F | Ht 65.5 in | Wt 131.5 lb

## 2014-07-21 DIAGNOSIS — R0789 Other chest pain: Secondary | ICD-10-CM

## 2014-07-21 MED ORDER — LORAZEPAM 0.5 MG PO TABS: 0.5000 mg | ORAL_TABLET | Freq: Three times a day (TID) | ORAL | Status: DC | PRN

## 2014-07-21 NOTE — Progress Notes (Signed)
Dr. Karleen Hampshire T. Kammy Klett, MD, CAQ [facility] Primary [facility] and Sports Medicine [address] [place], [address] Phone: [phone] Fax: [phone]  07/21/2014  Patient: Shelley Figueroa, MRN: [id number], DOB: Dec 18, 1975, 39 y.o.  Primary Physician:  Hannah Beat, MD  Chief Complaint: Follow-up  Subjective:   Shelley Figueroa is a 39 y.o. very pleasant female patient who presents with the following:  ER f/u: Chest pain.  The patient is very well-known to me.  She presents one day after having an extensive evaluation in the ER for chest pain.  She had a negative d-dimer, and her troponins were negative.  Her EKG was negative, and I independently reviewed.  She also had a normal chest x-ray.  She also had peripheral Dopplers for DVT which were negative.  Now she is still having chest pain, she's been having this for about the last 2 weeks, but it is escalated, and yesterday she says that it felt severe and she felt like it was like an ice pick in her chest.  She has not been feeling significantly depressed or anxious.  She has no history of drug abuse, no history of cocaine or amphetamine use.  Chest pain, more on t he right, under R axilla. Yesterday, felt lieke ice pick through the chest.   Father, 53, CABGx4, PCI multiple since mid-60's. HTN Lipids  Past Medical History, Surgical History, Social History, Family History, Problem List, Medications, and Allergies have been reviewed and updated if relevant.   GEN: No acute illnesses, no fevers, chills. GI: No n/v/d, eating normally Review of Systems  Constitutional: Negative.   HENT: Negative.  Negative for ear pain, hearing loss and tinnitus.   Eyes: Negative for blurred vision, double vision, photophobia and pain.  Respiratory: Positive for cough. Negative for hemoptysis and wheezing.   Cardiovascular: Positive for chest pain. Negative for palpitations, orthopnea, claudication, leg swelling and PND.    Gastrointestinal: Negative for heartburn, nausea, vomiting, abdominal pain, diarrhea and constipation.  Genitourinary: Negative.   Skin: Negative.   Neurological: Negative for sensory change, speech change, focal weakness, seizures, loss of consciousness and headaches.  Endo/Heme/Allergies: Negative.   Psychiatric/Behavioral: Negative for depression, suicidal ideas and substance abuse. The patient is not nervous/anxious.   Otherwise, the pertinent positives and negatives are listed above and in the HPI, otherwise a full review of systems has been reviewed and is negative unless noted positive.   Objective:   BP 116/76 mmHg  Pulse 80  Temp(Src) 98.6 F (37 C) (Oral)  Ht 5' 5.5" (1.664 m)  Wt 131 lb 8 oz (59.648 kg)  BMI 21.54 kg/m2  SpO2 97%  GEN: WDWN, NAD, Non-toxic, A & O x 3 HEENT: Atraumatic, Normocephalic. Neck supple. No masses, No LAD. Ears and Nose: No external deformity. CV: RRR, No M/G/R. No JVD. No thrill. No extra heart sounds. Chest wall: Examined with the patient's husband in the room.  Negative Barrel sign.  On the right greater than left approximately 6 or 7 rib, there is significant intercostal tenderness as well as pain at the rib attachment to the sternum on either side. PULM: CTA B, no wheezes, crackles, rhonchi. No retractions. No resp. distress. No accessory muscle use. EXTR: No c/c/e NEURO Normal gait.  PSYCH: Normally interactive. Conversant. Not depressed or anxious appearing.  Calm demeanor.   Laboratory and Imaging Data: Results for orders placed or performed during the hospital encounter of 07/20/14  CBC  Result Value Ref Range   WBC  9.2 4.0 - 10.5 K/uL   RBC 4.69 3.87 - 5.11 MIL/uL   Hemoglobin 13.8 12.0 - 15.0 g/dL   HCT 16.139.9 09.636.0 - 04.546.0 %   MCV 85.1 78.0 - 100.0 fL   MCH 29.4 26.0 - 34.0 pg   MCHC 34.6 30.0 - 36.0 g/dL   RDW 40.913.2 81.111.5 - 91.415.5 %   Platelets 313 150 - 400 K/uL  Basic metabolic panel  Result Value Ref Range   Sodium 139 135 -  145 mmol/L   Potassium 3.1 (L) 3.5 - 5.1 mmol/L   Chloride 102 96 - 112 mmol/L   CO2 30 19 - 32 mmol/L   Glucose, Bld 96 70 - 99 mg/dL   BUN 17 6 - 23 mg/dL   Creatinine, Ser 7.821.07 0.50 - 1.10 mg/dL   Calcium 9.1 8.4 - 95.610.5 mg/dL   GFR calc non Af Amer 64 (L) >90 mL/min   GFR calc Af Amer 74 (L) >90 mL/min   Anion gap 7 5 - 15  Troponin I  Result Value Ref Range   Troponin I <0.03 <0.031 ng/mL  D-dimer, quantitative  Result Value Ref Range   D-Dimer, Quant <0.27 0.00 - 0.48 ug/mL-FEU  I-stat troponin, ED  Result Value Ref Range   Troponin i, poc 0.00 0.00 - 0.08 ng/mL   Comment 3            Dg Chest 2 View  07/20/2014   CLINICAL DATA:  Right-sided chest pain for 3 days. History of bronchitis for 2 weeks.  EXAM: CHEST  2 VIEW  COMPARISON:  07/05/2014  FINDINGS: The heart size and mediastinal contours are within normal limits. Both lungs are clear. The visualized skeletal structures are unremarkable.  IMPRESSION: No active cardiopulmonary disease.   Electronically Signed   By: Burman NievesWilliam  Stevens M.D.   On: 07/20/2014 17:35   Dg Chest 2 View  07/05/2014   CLINICAL DATA:  Cough with chest tightness and sore throat. Diagnosed with bronchitis 10 days ago.  EXAM: CHEST  2 VIEW  COMPARISON:  10/27/2009  FINDINGS: The heart size and mediastinal contours are within normal limits. Both lungs are clear. The visualized skeletal structures are unremarkable.  IMPRESSION: No active cardiopulmonary disease.   Electronically Signed   By: Elberta Fortisaniel  Boyle M.D.   On: 07/05/2014 16:11     Assessment and Plan:   Other chest pain - Plan: Echocardiogram stress test without contrast  Long conversation with the patient and her husband about this.  This does not seem like typical cardiac chest pain.  Very reproducible on examination, and at age 39 years old, more likely musculoskeletal chest pain.  She did have some very intense deep tissue massage a few weeks ago.  Nevertheless, she does have some hypertension,  hyperlipidemia, and a very strong family history of cardiac disease.  We are going to obtain a stress echo to evaluate for any ischemic changes.  On the off chance that she may be experiencing some anxiety that is exacerbating this, I given her some Ativan to try while her chest pain is the worst.  Follow-up: No Follow-up on file.  New Prescriptions   LORAZEPAM (ATIVAN) 0.5 MG TABLET    Take 1 tablet (0.5 mg total) by mouth every 8 (eight) hours as needed for anxiety.   Orders Placed This Encounter  Procedures  . Echocardiogram stress test without contrast    Signed,  Nayel Purdy T. Haruka Kowaleski, MD   Patient's Medications  New Prescriptions   LORAZEPAM (  ATIVAN) 0.5 MG TABLET    Take 1 tablet (0.5 mg total) by mouth every 8 (eight) hours as needed for anxiety.  Previous Medications   ALBUTEROL (PROVENTIL HFA;VENTOLIN HFA) 108 (90 BASE) MCG/ACT INHALER    Inhale 2 puffs into the lungs every 4 (four) hours as needed for wheezing or shortness of breath.   BUPROPION (WELLBUTRIN XL) 300 MG 24 HR TABLET    Take 300 mg by mouth daily.     CETIRIZINE (ZYRTEC) 10 MG TABLET    Take 10 mg by mouth daily.     ESZOPICLONE 3 MG TABS    Take 3 mg by mouth at bedtime. Take immediately before bedtime   FLUTICASONE-SALMETEROL (ADVAIR DISKUS) 100-50 MCG/DOSE AEPB    Inhale 1 puff into the lungs 2 (two) times daily.   GUAIFENESIN PO    Take by mouth.   L-LYSINE HCL 1000 MG TABS    Take by mouth daily.   NAPROXEN (NAPROSYN) 500 MG TABLET    Take 1 tablet (500 mg total) by mouth 2 (two) times daily with a meal.   SPACER/AERO-HOLDING CHAMBERS DEVI    Use as directed  Modified Medications   No medications on file  Discontinued Medications   IPRATROPIUM (ATROVENT) 0.06 % NASAL SPRAY    Place 2 sprays into both nostrils 4 (four) times daily.   PREDNISONE (DELTASONE) 50 MG TABLET    Take daily with breakfast

## 2014-07-21 NOTE — Patient Instructions (Signed)

## 2014-07-21 NOTE — Progress Notes (Signed)
Pre visit review using our clinic review tool, if applicable. No additional management support is needed unless otherwise documented below in the visit note. 

## 2014-08-18 ENCOUNTER — Other Ambulatory Visit (INDEPENDENT_AMBULATORY_CARE_PROVIDER_SITE_OTHER): Payer: Managed Care, Other (non HMO)

## 2014-08-18 DIAGNOSIS — R0789 Other chest pain: Secondary | ICD-10-CM | POA: Diagnosis not present

## 2014-09-25 ENCOUNTER — Encounter: Payer: Self-pay | Admitting: Family Medicine

## 2014-09-25 ENCOUNTER — Ambulatory Visit (INDEPENDENT_AMBULATORY_CARE_PROVIDER_SITE_OTHER): Payer: Managed Care, Other (non HMO) | Admitting: Family Medicine

## 2014-09-25 VITALS — BP 140/96 | HR 85 | Temp 98.9°F | Ht 66.0 in | Wt 131.5 lb

## 2014-09-25 DIAGNOSIS — Z0000 Encounter for general adult medical examination without abnormal findings: Secondary | ICD-10-CM | POA: Diagnosis not present

## 2014-09-25 MED ORDER — LISINOPRIL 10 MG PO TABS: 10.0000 mg | ORAL_TABLET | Freq: Every day | ORAL | Status: DC

## 2014-09-25 NOTE — Progress Notes (Signed)
Dr. Frederico Hamman T. Najai Waszak, MD, [facility] Medicine [place], [address] Phone: [phone] Fax: [phone]  09/25/2014  Patient: Shelley Figueroa, MRN: [id number], DOB: May 17, 1976, 39 y.o.  Primary Physician:  Owens Loffler, MD  Chief Complaint: Annual Exam  Subjective:   Shelley Figueroa is a 39 y.o. pleasant patient who presents with the following:  Health Maintenance Summary Reviewed and updated, unless pt declines services.  Tobacco History Reviewed. Non-smoker Alcohol: No concerns, no excessive use Exercise Habits: Several days a week, 3-4 miles, mostly walking, rec at least 30 mins 5 times a week STD concerns: none Drug Use: None Birth control method: IUD Lumps or breast concerns: no Breast Cancer Family History: no  Blood pressure.  Exercising, cut out salt.  Both mom and dad have high blood pressure. BP is up  Breathing is doing fine.  Ran 3 1/2 miles in the afternoon.   Health Maintenance  Topic Date Due  . HIV Screening  09/10/1988  . TETANUS/TDAP  09/10/1992  . INFLUENZA VACCINE  12/29/2014  . PAP SMEAR  09/25/2017    Immunization History  Administered Date(s) Administered  . Influenza Whole 03/16/2007   Patient Active Problem List   Diagnosis Date Noted  . Asthma, chronic 05/28/2014  . Essential hypertension 05/28/2014  . GERD 09/23/2009  . 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 ALLERGIC RHINITIS 07/06/2009   Past Medical History  Diagnosis Date  . Allergic rhinitis due to pollen   . Sinus problem     chronic  . Depression   . Chronic bronchitis   . Asthma, chronic 05/28/2014  . Kidney stones   . Essential hypertension 05/28/2014   Past Surgical History  Procedure Laterality Date  . Cesarean section      x2  . Knee surgery    . Septoplasty    . Nasal sinus surgery     History   Social History  . Marital Status: Married    Spouse Name: Jonni Sanger  .  Number of Children: 2  . Years of Education: N/A   Occupational History  . Executive     [employer]   Social History Main Topics  . Smoking status: Never Smoker   . Smokeless tobacco: Never Used  . Alcohol Use: 0.0 oz/week    0 Standard drinks or equivalent per week     Comment: occasional  . Drug Use: No  . Sexual Activity:    Partners: Male    Patent examiner Protection: IUD   Other Topics Concern  . Not on file   Social History Narrative   From [place] originally   [employer], Class of 1996   Married to [place]   2 children   Family History  Problem Relation Age of Onset  . Hypertension Mother   . Hyperlipidemia Mother   . Hypertension Father   . Hyperlipidemia Father    Allergies  Allergen Reactions  . Amoxicillin     REACTION: rash  . Codeine     REACTION: itching  . Erythromycin     REACTION: nausea and vomiting  . Neomycin Nausea And Vomiting  . Penicillins     Medication list has been reviewed and updated.   General: Denies fever, chills, sweats. No significant weight loss. Eyes: Denies blurring,significant itching ENT: Denies earache, sore throat, and hoarseness.  Cardiovascular: Denies chest pains, palpitations, dyspnea on exertion,  Respiratory: Denies cough, dyspnea at  rest,wheeezing Breast: no concerns about lumps GI: Denies nausea, vomiting, diarrhea, constipation, change in bowel habits, abdominal pain, melena, hematochezia GU: Denies dysuria, hematuria, urinary hesitancy, nocturia, denies STD risk, no concerns about discharge Musculoskeletal: Denies back pain, joint pain Derm: Denies rash, itching Neuro: Denies  paresthesias, frequent falls, frequent headaches Psych: Denies depression, anxiety. SOME INSOMNIA Endocrine: Denies cold intolerance, heat intolerance, polydipsia Heme: Denies enlarged lymph nodes Allergy: No hayfever  Objective:   BP 140/96 mmHg  Pulse 85  Temp(Src) 98.9 F (37.2 C) (Oral)  Ht 5' 6"  (1.676 m)   Wt 131 lb 8 oz (59.648 kg)  BMI 21.23 kg/m2 No exam data present  GEN: well developed, well nourished, no acute distress Eyes: conjunctiva and lids normal, PERRLA, EOMI ENT: TM clear, nares clear, oral exam WNL Neck: supple, no lymphadenopathy, no thyromegaly, no JVD Pulm: clear to auscultation and percussion, respiratory effort normal CV: regular rate and rhythm, S1-S2, no murmur, rub or gallop, no bruits Chest: no scars, masses, no lumps BREAST: breast exam declined GI: soft, non-tender; no hepatosplenomegaly, masses; active bowel sounds all quadrants GU: GU exam declined Lymph: no cervical, axillary or inguinal adenopathy MSK: gait normal, muscle tone and strength WNL, no joint swelling, effusions, discoloration, crepitus  SKIN: clear, good turgor, color WNL, no rashes, lesions, or ulcerations Neuro: normal mental status, normal strength, sensation, and motion Psych: alert; oriented to person, place and time, normally interactive and not anxious or depressed in appearance.   All labs reviewed with patient. Lipids:    Component Value Date/Time   CHOL 211* 05/28/2014 0903   TRIG 86.0 05/28/2014 0903   HDL 48.30 05/28/2014 0903   VLDL 17.2 05/28/2014 0903   CHOLHDL 4 05/28/2014 0903   CBC: CBC Latest Ref Rng 07/20/2014 05/28/2014 01/08/2010  WBC 4.0 - 10.5 K/uL 9.2 6.0 5.9  Hemoglobin 12.0 - 15.0 g/dL 13.8 13.1 13.5  Hematocrit 36.0 - 46.0 % 39.9 38.9 38.6  Platelets 150 - 400 K/uL 313 246.0 672.0    Basic Metabolic Panel:    Component Value Date/Time   NA 139 07/20/2014 1707   K 3.1* 07/20/2014 1707   CL 102 07/20/2014 1707   CO2 30 07/20/2014 1707   BUN 17 07/20/2014 1707   CREATININE 1.07 07/20/2014 1707   GLUCOSE 96 07/20/2014 1707   CALCIUM 9.1 07/20/2014 1707   Hepatic Function Latest Ref Rng 05/28/2014 01/08/2010  Total Protein 6.0 - 8.3 g/dL 6.8 7.0  Albumin 3.5 - 5.2 g/dL 4.2 4.4  AST 0 - 37 U/L 16 16  ALT 0 - 35 U/L 12 13  Alk Phosphatase 39 - 117 U/L 56  63  Total Bilirubin 0.2 - 1.2 mg/dL 0.5 0.7  Bilirubin, Direct 0.0 - 0.3 mg/dL 0.0 0.1    Lab Results  Component Value Date   TSH 2.21 05/28/2014   No results found.  Assessment and Plan:   Healthcare maintenance  Health Maintenance Exam: The patient's preventative maintenance and recommended screening tests for an annual wellness exam were reviewed in full today. Brought up to date unless services declined.  Counselled on the importance of diet, exercise, and its role in overall health and mortality. The patient's FH and SH was reviewed, including their home life, tobacco status, and drug and alcohol status.  She will check her BP readings at home  Follow-up: No Follow-up on file. Or follow-up in 1 year for complete physical examination  New Prescriptions   LISINOPRIL (PRINIVIL,ZESTRIL) 10 MG TABLET    Take 1  tablet (10 mg total) by mouth daily.   No orders of the defined types were placed in this encounter.    Signed,  Maud Deed. Greely Atiyeh, MD   Patient's Medications  New Prescriptions   LISINOPRIL (PRINIVIL,ZESTRIL) 10 MG TABLET    Take 1 tablet (10 mg total) by mouth daily.  Previous Medications   ALBUTEROL (PROVENTIL HFA;VENTOLIN HFA) 108 (90 BASE) MCG/ACT INHALER    Inhale 2 puffs into the lungs every 4 (four) hours as needed for wheezing or shortness of breath.   BUPROPION (WELLBUTRIN XL) 300 MG 24 HR TABLET    Take 300 mg by mouth daily.     CETIRIZINE (ZYRTEC) 10 MG TABLET    Take 10 mg by mouth daily.     ESZOPICLONE 3 MG TABS    Take 3 mg by mouth at bedtime. Take immediately before bedtime   FLUTICASONE-SALMETEROL (ADVAIR DISKUS) 100-50 MCG/DOSE AEPB    Inhale 1 puff into the lungs 2 (two) times daily.   GUAIFENESIN PO    Take by mouth.   L-LYSINE HCL 1000 MG TABS    Take by mouth daily.   SPACER/AERO-HOLDING CHAMBERS DEVI    Use as directed  Modified Medications   No medications on file  Discontinued Medications   LORAZEPAM (ATIVAN) 0.5 MG TABLET    Take  1 tablet (0.5 mg total) by mouth every 8 (eight) hours as needed for anxiety.   NAPROXEN (NAPROSYN) 500 MG TABLET    Take 1 tablet (500 mg total) by mouth 2 (two) times daily with a meal.

## 2014-09-25 NOTE — Progress Notes (Signed)
Pre visit review using our clinic review tool, if applicable. No additional management support is needed unless otherwise documented below in the visit note. 

## 2014-09-26 ENCOUNTER — Encounter: Payer: Self-pay | Admitting: Family Medicine

## 2014-10-01 ENCOUNTER — Encounter: Payer: Managed Care, Other (non HMO) | Admitting: Family Medicine

## 2015-03-16 ENCOUNTER — Telehealth: Payer: Self-pay

## 2015-03-16 MED ORDER — HYDROCHLOROTHIAZIDE 12.5 MG PO CAPS: 12.5000 mg | ORAL_CAPSULE | Freq: Every morning | ORAL | Status: DC

## 2015-03-16 NOTE — Telephone Encounter (Signed)
Please call  That is a good thought - a cough can happen as a side effect from that medicine in about 5-10% of the population. We should just change to something else.   HCTZ 12.5, 1 tab po each AM daily. #30, 5 ref  Stop lisinopril

## 2015-03-16 NOTE — Telephone Encounter (Signed)
Kyann notified as instructed by telephone.  Rx sent to [facility] in [place].

## 2015-03-16 NOTE — Telephone Encounter (Signed)
Pt left v/m; pt was seen at ENT and advised having reaction to ace inhibitor (lisinopril) which is causing a cough;pt advised to stop lisinopril; pt request different med to [facility] [place]. Pt request cb.

## 2015-04-13 ENCOUNTER — Encounter: Payer: Self-pay | Admitting: Family Medicine

## 2015-04-13 ENCOUNTER — Ambulatory Visit (INDEPENDENT_AMBULATORY_CARE_PROVIDER_SITE_OTHER): Payer: Managed Care, Other (non HMO) | Admitting: Family Medicine

## 2015-04-13 VITALS — BP 126/80 | HR 85 | Temp 98.9°F | Ht 66.0 in | Wt 132.5 lb

## 2015-04-13 DIAGNOSIS — J320 Chronic maxillary sinusitis: Secondary | ICD-10-CM

## 2015-04-13 MED ORDER — DOXYCYCLINE HYCLATE 100 MG PO TABS: 100.0000 mg | ORAL_TABLET | Freq: Two times a day (BID) | ORAL | Status: DC

## 2015-04-13 MED ORDER — FLUCONAZOLE 150 MG PO TABS: 150.0000 mg | ORAL_TABLET | Freq: Once | ORAL | Status: DC

## 2015-04-13 NOTE — Progress Notes (Signed)
Dr. Karleen HampshireSpencer T. Zafar Debrosse, MD, CA[facility] Primary [facility] and Sports Medicine [address] [place], [id number] Phone: [phone] Fax: [phone]  04/13/2015  Patient: Shelley HarrisonHeather R Figueroa, MRN: [id number], DOB: 12/03/1975, 39 y.o.  Primary Physician:  Hannah BeatSpencer Ilian Wessell, MD   Chief Complaint  Patient presents with  . Sore Throat  . Facial Pain  . Generalized Body Aches  . Ear Pain    Right   Subjective:   This 39 y.o. female patient presents with runny nose, sneezing, cough, sore throat, malaise and minimal / low-grade fever for > 1 week. Now the primary complaint has become sinus pressure and pain behind the eyes and in the upper, anterior face.   R ear pain Pressure and pain - all of the R  Friday - started to feel bad Had a headache and felt bad  On allergy drops - second week of those.    The patent denies sore throat as the primary complaint. Denies sthortness of breath/wheezing, high fever, chest pain, significant myalgia, otalgia, abdominal pain, changes in bowel or bladder.  PMH, PHS, Allergies, Problem List, Medications, Family History, and Social History have all been reviewed.  Patient Active Problem List   Diagnosis Date Noted  . Asthma, chronic 05/28/2014  . Essential hypertension 05/28/2014  . GERD 09/23/2009  . 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 ALLERGIC RHINITIS 07/06/2009    Past Medical History  Diagnosis Date  . Allergic rhinitis due to pollen   . Sinus problem     chronic  . Depression   . Chronic bronchitis (HCC)   . Asthma, chronic 05/28/2014  . Kidney stones   . Essential hypertension 05/28/2014    Past Surgical History  Procedure Laterality Date  . Cesarean section      x2  . Knee surgery    . Septoplasty    . Nasal sinus surgery      Social History   Social History  . Marital Status: Married    Spouse Name: Mardelle Mattendy  . Number of Children: 2  . Years of Education: N/A   Occupational  History  . Executive     [employer]   Social History Main Topics  . Smoking status: Never Smoker   . Smokeless tobacco: Never Used  . Alcohol Use: 0.0 oz/week    0 Standard drinks or equivalent per week     Comment: occasional  . Drug Use: No  . Sexual Activity:    Partners: Male    Pharmacist, hospitalBirth Control/ Protection: IUD   Other Topics Concern  . Not on file   Social History Narrative   From [place]   [employer], [employer]   Married to Maria SteinAndy Basich   2 children    Family History  Problem Relation Age of Onset  . Hypertension Mother   . Hyperlipidemia Mother   . Hypertension Father   . Hyperlipidemia Father   . Coronary artery disease Father     Allergies  Allergen Reactions  . Amoxicillin     REACTION: rash  . Codeine     REACTION: itching  . Erythromycin     REACTION: nausea and vomiting  . Lisinopril     cough  . Neomycin Nausea And Vomiting  . Penicillins     Medication list reviewed and updated in full in [facility].  ROS as above, eating and drinking - tolerating PO. Urinating normally. No excessive vomitting or diarrhea.  O/w as above.  Objective:   Blood pressure 126/80, pulse 85, temperature 98.9 F (37.2 C), temperature source Oral, height  (1.676 m), weight 132 lb 8 oz (60.102 kg).  GEN: WDWN, Non-toxic, Atraumatic, normocephalic. A and O x 3. HEENT: Oropharynx clear without exudate, MMM, no significant LAD, mild rhinnorhea Sinuses: Right Frontal, ethmoid, and maxillary: Tender, max and frontal Left Frontal, Ethmoid, and maxillary: non-Tender Ears: TM clear, COL visualized with good landmarks CV: RRR, no m/g/r. Pulm: CTA B, no wheezes, rhonchi, or crackles, normal respiratory effort. EXT: no c/c/e Psych: well oriented, neither depressed nor anxious in appearance  Assessment and Plan:   Right maxillary sinusitis  Acute sinusitis: ABX as below.   Reviewed symptomatic care as well as ABX in this case.     Follow-up: No Follow-up on file.  New Prescriptions   DOXYCYCLINE (VIBRA-TABS) 100 MG TABLET    Take 1 tablet (100 mg total) by mouth 2 (two) times daily.   FLUCONAZOLE (DIFLUCAN) 150 MG TABLET    Take 1 tablet (150 mg total) by mouth once. Repeat if needed in 1 week   Modified Medications   No medications on file   No orders of the defined types were placed in this encounter.    Signed,  Elpidio Galea. Ellenor Wisniewski, MD  Patient's Medications  New Prescriptions   DOXYCYCLINE (VIBRA-TABS) 100 MG TABLET    Take 1 tablet (100 mg total) by mouth 2 (two) times daily.   FLUCONAZOLE (DIFLUCAN) 150 MG TABLET    Take 1 tablet (150 mg total) by mouth once. Repeat if needed in 1 week  Previous Medications   ALBUTEROL (PROVENTIL HFA;VENTOLIN HFA) 108 (90 BASE) MCG/ACT INHALER    Inhale 2 puffs into the lungs every 4 (four) hours as needed for wheezing or shortness of breath.   BUPROPION (WELLBUTRIN XL) 300 MG 24 HR TABLET    Take 300 mg by mouth daily.     CETIRIZINE (ZYRTEC) 10 MG TABLET    Take 10 mg by mouth daily.     ESZOPICLONE 3 MG TABS    Take 3 mg by mouth at bedtime. Take immediately before bedtime   FLUTICASONE-SALMETEROL (ADVAIR DISKUS) 100-50 MCG/DOSE AEPB    Inhale 1 puff into the lungs 2 (two) times daily.   GUAIFENESIN PO    Take by mouth.   HYDROCHLOROTHIAZIDE (MICROZIDE) 12.5 MG CAPSULE    Take 1 capsule (12.5 mg total) by mouth every morning.   L-LYSINE HCL 1000 MG TABS    Take by mouth daily.   SPACER/AERO-HOLDING CHAMBERS DEVI    Use as directed  Modified Medications   No medications on file  Discontinued Medications   LISINOPRIL (PRINIVIL,ZESTRIL) 10 MG TABLET    Take 1 tablet (10 mg total) by mouth daily.

## 2015-04-13 NOTE — Progress Notes (Signed)
Pre visit review using our clinic review tool, if applicable. No additional management support is needed unless otherwise documented below in the visit note. 

## 2015-04-15 ENCOUNTER — Telehealth: Payer: Self-pay

## 2015-04-15 MED ORDER — AZITHROMYCIN 250 MG PO TABS
ORAL_TABLET | ORAL | Status: DC
Start: 2015-04-15 — End: 2015-11-25

## 2015-04-15 NOTE — Telephone Encounter (Signed)
Pt left v/m; pt seen 04/13/15; doxycycline causing N&V and pt request different abx sent to [facility] [place]; pt has taken Zpak previously for sinus infection. Pt request cb.

## 2015-04-15 NOTE — Telephone Encounter (Signed)
n and v is not a true allergy - but relatively common. Food decreases n and v.   We can change to a zpak if she prefers.  Zpak, 2 tabs po on day 1, then 1 tab po for 4 days, #6 tabs

## 2015-04-15 NOTE — Telephone Encounter (Signed)
Peytin notified as instructed by telephone.  She would like a Z-pak sent into her pharmacy.  Z-pak sent as requested.

## 2015-05-04 ENCOUNTER — Telehealth: Payer: Self-pay | Admitting: *Deleted

## 2015-05-04 NOTE — Telephone Encounter (Signed)
Left message for Herbert SetaHeather to call our office to schedule flu vaccine or let us know if she has had once done somewhere else.

## 2015-08-17 ENCOUNTER — Other Ambulatory Visit: Payer: Self-pay | Admitting: *Deleted

## 2015-08-17 MED ORDER — HYDROCHLOROTHIAZIDE 12.5 MG PO CAPS: 12.5000 mg | ORAL_CAPSULE | Freq: Every day | ORAL | Status: DC

## 2015-11-25 ENCOUNTER — Encounter: Payer: Self-pay | Admitting: Family Medicine

## 2015-11-25 ENCOUNTER — Ambulatory Visit (INDEPENDENT_AMBULATORY_CARE_PROVIDER_SITE_OTHER): Payer: Managed Care, Other (non HMO) | Admitting: Primary Care

## 2015-11-25 ENCOUNTER — Encounter: Payer: Self-pay | Admitting: Primary Care

## 2015-11-25 VITALS — BP 130/82 | HR 80 | Temp 98.7°F | Ht 66.0 in | Wt 130.1 lb

## 2015-11-25 DIAGNOSIS — M545 Low back pain, unspecified: Secondary | ICD-10-CM

## 2015-11-25 MED ORDER — METHOCARBAMOL 500 MG PO TABS: 500.0000 mg | ORAL_TABLET | Freq: Three times a day (TID) | ORAL | Status: DC | PRN

## 2015-11-25 NOTE — Progress Notes (Signed)
Subjective:    Patient ID: Shelley HarrisonHeather R Figueroa, female    DOB: January 20, 1976, 40 y.o.   MRN: [id number]  HPI  Mr. Hilda Bladesrmstrong is a 40 year old female who presents today with a chief complaint of back pain. She has a 6945 pound, 42 year old dog whom she's been lifting recently as his legs are going out. Her pain is located to the mid lower part of her back with radiation down to the right side of her groin and up through her neck. She saw her chiropractor yesterday and is undergoing therapy with temporary improvement. She's used ice and is taking Aleve with temporary improvement. Her pain is worse movement (prolonged periods of walking), improved with rest. She will be going out of the country in 2 weeks and would like relief before she leaves. Denies numbness/tingling and weakness.  Review of Systems  Musculoskeletal: Positive for myalgias, back pain and gait problem.  Neurological: Negative for weakness and numbness.       Past Medical History  Diagnosis Date  . Allergic rhinitis due to pollen   . Sinus problem     chronic  . Depression   . Chronic bronchitis (HCC)   . Asthma, chronic 05/28/2014  . Kidney stones   . Essential hypertension 05/28/2014     Social History   Social History  . Marital Status: Married    Spouse Name: Mardelle Mattendy  . Number of Children: 2  . Years of Education: N/A   Occupational History  . Executive     [employer]   Social History Main Topics  . Smoking status: Never Smoker   . Smokeless tobacco: Never Used  . Alcohol Use: 0.0 oz/week    0 Standard drinks or equivalent per week     Comment: occasional  . Drug Use: No  . Sexual Activity:    Partners: Male    Pharmacist, hospitalBirth Control/ Protection: IUD   Other Topics Concern  . Not on file   Social History Narrative   From [place] originally   [employer]   Married to [employer]y Constantino   2 children    Past Surgical History  Procedure Laterality Date  . Cesarean section      x2  . Knee  surgery    . Septoplasty    . Nasal sinus surgery      Family History  Problem Relation Age of Onset  . Hypertension Mother   . Hyperlipidemia Mother   . Hypertension Father   . Hyperlipidemia Father   . Coronary artery disease Father     Allergies  Allergen Reactions  . Amoxicillin     REACTION: rash  . Codeine     REACTION: itching  . Doxycycline Nausea And Vomiting    Not a true allergy  . Erythromycin     REACTION: nausea and vomiting  . Lisinopril     cough  . Neomycin Nausea And Vomiting  . Penicillins     Current Outpatient Prescriptions on File Prior to Visit  Medication Sig Dispense Refill  . buPROPion (WELLBUTRIN XL) 300 MG 24 hr tablet Take 300 mg by mouth daily.      . cetirizine (ZYRTEC) 10 MG tablet Take 10 mg by mouth daily.      . Eszopiclone 3 MG TABS Take 3 mg by mouth at bedtime. Take immediately before bedtime    . GUAIFENESIN PO Take by mouth.    . hydrochlorothiazide (MICROZIDE) 12.5 MG capsule Take 1  capsule (12.5 mg total) by mouth daily. **Must have follow up for further refills** 30 capsule 5  . L-Lysine HCl 1000 MG TABS Take by mouth daily.    Marland Kitchen. Spacer/Aero-Holding Rudean Curthambers DEVI Use as directed 1 each 1   No current facility-administered medications on file prior to visit.    BP 130/82 mmHg  Pulse 80  Temp(Src) 98.7 F (37.1 C) (Oral)  Ht 5\' 6"  (1.676 m)  Wt 130 lb 1.9 oz (59.022 kg)  BMI 21.01 kg/m2  SpO2 97%    Objective:   Physical Exam  Constitutional: She appears well-nourished.  Cardiovascular: Normal rate and regular rhythm.   Pulmonary/Chest: Effort normal and breath sounds normal.  Musculoskeletal:       Cervical back: She exhibits normal range of motion, no tenderness and no pain.       Thoracic back: She exhibits normal range of motion, no tenderness and no pain.       Lumbar back: She exhibits decreased range of motion, tenderness and pain. She exhibits no bony tenderness and no deformity.  Skin: Skin is warm and  dry.          Assessment & Plan:  Muscle strain:  Present for the last several days after lifting 45 pound dog. Temporary improvement with chiropractor visits and Aleve. Appears uncomfortable with decrease in range of motion due to discomfort. Suspect muscle strain due to weight lifting and will treat with muscle relaxers and Aleve. Prescription for Robaxin sent to pharmacy with drowsiness precautions. Continue Aleve as needed. Application of heat/ice and continue with chiropractor for therapy. She will notify us if no improvement in 2 weeks.  Morrie Sheldonlark,Katherine Kendal, NP

## 2015-11-25 NOTE — Patient Instructions (Signed)
You may take methocarbamol muscle relaxer every 8 hours as needed for pain and muscle spasms. Caution as this medication may cause drowsiness.  Continue Aleve as needed for pain and inflammation.  Continue working with Product/process development scientistyour chiropractor.  Please notify us if no improvement in 2 weeks.  It was a pleasure meeting you!   [facility] in [place]  Back Pain, Adult Back pain is very common in adults.The cause of back pain is rarely dangerous and the pain often gets better over time.The cause of your back pain may not be known. Some common causes of back pain include:  Strain of the muscles or ligaments supporting the spine.  Wear and tear (degeneration) of the spinal disks.  Arthritis.  Direct injury to the back. For many people, back pain may return. Since back pain is rarely dangerous, most people can learn to manage this condition on their own. HOME CARE INSTRUCTIONS Watch your back pain for any changes. The following actions may help to lessen any discomfort you are feeling:  Remain active. It is stressful on your back to sit or stand in one place for long periods of time. Do not sit, drive, or stand in one place for more than 30 minutes at a time. Take short walks on even surfaces as soon as you are able.Try to increase the length of time you walk each day.  Exercise regularly as directed by your health care provider. Exercise helps your back heal faster. It also helps avoid future injury by keeping your muscles strong and flexible.  Do not stay in bed.Resting more than 1-2 days can delay your recovery.  Pay attention to your body when you bend and lift. The most comfortable positions are those that put less stress on your recovering back. Always use proper lifting techniques, including:  Bending your knees.  Keeping the load close to your body.  Avoiding twisting.  Find a comfortable position to sleep. Use a firm mattress and lie on your side with your knees slightly bent. If  you lie on your back, put a pillow under your knees.  Avoid feeling anxious or stressed.Stress increases muscle tension and can worsen back pain.It is important to recognize when you are anxious or stressed and learn ways to manage it, such as with exercise.  Take medicines only as directed by your health care provider. Over-the-counter medicines to reduce pain and inflammation are often the most helpful.Your health care provider may prescribe muscle relaxant drugs.These medicines help dull your pain so you can more quickly return to your normal activities and healthy exercise.  Apply ice to the injured area:  Put ice in a plastic bag.  Place a towel between your skin and the bag.  Leave the ice on for 20 minutes, 2-3 times a day for the first 2-3 days. After that, ice and heat may be alternated to reduce pain and spasms.  Maintain a healthy weight. Excess weight puts extra stress on your back and makes it difficult to maintain good posture. SEEK MEDICAL CARE IF:  You have pain that is not relieved with rest or medicine.  You have increasing pain going down into the legs or buttocks.  You have pain that does not improve in one week.  You have night pain.  You lose weight.  You have a fever or chills. SEEK IMMEDIATE MEDICAL CARE IF:   You develop new bowel or bladder control problems.  You have unusual weakness or numbness in your arms or legs.  You develop nausea or vomiting.  You develop abdominal pain.  You feel faint.   This information is not intended to replace advice given to you by your health care provider. Make sure you discuss any questions you have with your health care provider.   Document Released: 05/16/2005 Document Revised: 06/06/2014 Document Reviewed: 09/17/2013 Elsevier Interactive Patient Education [employer]2016 Elsevier Inc.

## 2015-11-25 NOTE — Progress Notes (Signed)
Pre visit review using our clinic review tool, if applicable. No additional management support is needed unless otherwise documented below in the visit note. 

## 2015-11-30 ENCOUNTER — Other Ambulatory Visit (INDEPENDENT_AMBULATORY_CARE_PROVIDER_SITE_OTHER): Payer: Managed Care, Other (non HMO)

## 2015-11-30 ENCOUNTER — Other Ambulatory Visit: Payer: Self-pay | Admitting: Family Medicine

## 2015-11-30 DIAGNOSIS — R197 Diarrhea, unspecified: Secondary | ICD-10-CM | POA: Diagnosis not present

## 2015-11-30 DIAGNOSIS — Z13220 Encounter for screening for lipoid disorders: Secondary | ICD-10-CM

## 2015-11-30 DIAGNOSIS — R5383 Other fatigue: Secondary | ICD-10-CM

## 2015-11-30 LAB — CBC WITH DIFFERENTIAL/PLATELET
Basophils Absolute: 0 10*3/uL (ref 0.0–0.1)
Basophils Relative: 0.5 % (ref 0.0–3.0)
Eosinophils Absolute: 0.1 10*3/uL (ref 0.0–0.7)
Eosinophils Relative: 1.2 % (ref 0.0–5.0)
HCT: 37.8 % (ref 36.0–46.0)
Hemoglobin: 13 g/dL (ref 12.0–15.0)
LYMPHS ABS: 1.8 10*3/uL (ref 0.7–4.0)
Lymphocytes Relative: 25.4 % (ref 12.0–46.0)
MCHC: 34.3 g/dL (ref 30.0–36.0)
MCV: 88.7 fl (ref 78.0–100.0)
MONO ABS: 0.5 10*3/uL (ref 0.1–1.0)
MONOS PCT: 6.9 % (ref 3.0–12.0)
NEUTROS ABS: 4.6 10*3/uL (ref 1.4–7.7)
NEUTROS PCT: 66 % (ref 43.0–77.0)
PLATELETS: 262 10*3/uL (ref 150.0–400.0)
RBC: 4.27 Mil/uL (ref 3.87–5.11)
RDW: 12.4 % (ref 11.5–15.5)
WBC: 7 10*3/uL (ref 4.0–10.5)

## 2015-11-30 LAB — BASIC METABOLIC PANEL
BUN: 21 mg/dL (ref 6–23)
CO2: 31 meq/L (ref 19–32)
Calcium: 8.8 mg/dL (ref 8.4–10.5)
Chloride: 104 mEq/L (ref 96–112)
Creatinine, Ser: 0.84 mg/dL (ref 0.40–1.20)
GFR: 78.94 mL/min (ref >60.00)
GLUCOSE: 96 mg/dL (ref 70–99)
Potassium: 3.6 mEq/L (ref 3.5–5.1)
SODIUM: 139 meq/L (ref 135–145)

## 2015-11-30 LAB — HEPATIC FUNCTION PANEL
ALT: 21 U/L (ref 0–35)
AST: 21 U/L (ref 0–37)
Albumin: 4.1 g/dL (ref 3.5–5.2)
Alkaline Phosphatase: 50 U/L (ref 39–117)
BILIRUBIN DIRECT: 0.1 mg/dL (ref 0.0–0.3)
BILIRUBIN TOTAL: 0.5 mg/dL (ref 0.2–1.2)
Total Protein: 6.4 g/dL (ref 6.0–8.3)

## 2015-11-30 LAB — LIPID PANEL
CHOLESTEROL: 212 mg/dL — AB (ref 0–200)
HDL: 53.3 mg/dL (ref >39.00)
LDL Cholesterol: 139 mg/dL — ABNORMAL HIGH (ref 0–99)
NonHDL: 158.48
TRIGLYCERIDES: 95 mg/dL (ref 0.0–149.0)
Total CHOL/HDL Ratio: 4
VLDL: 19 mg/dL (ref 0.0–40.0)

## 2015-11-30 LAB — TSH: TSH: 3.12 u[IU]/mL (ref 0.35–4.50)

## 2015-12-01 LAB — TISSUE TRANSGLUTAMINASE, IGA: Tissue Transglutaminase Ab, IgA: 1 U/mL (ref <4)

## 2015-12-01 LAB — TISSUE TRANSGLUTAMINASE, IGG: Tissue Transglut Ab: 1 U/mL (ref <6)

## 2015-12-02 LAB — ENDOMYSIAL AB IGA RFLX TITER: Endomysial Screen: NEGATIVE

## 2015-12-07 ENCOUNTER — Ambulatory Visit (INDEPENDENT_AMBULATORY_CARE_PROVIDER_SITE_OTHER): Payer: Managed Care, Other (non HMO) | Admitting: Family Medicine

## 2015-12-07 ENCOUNTER — Encounter: Payer: Self-pay | Admitting: Family Medicine

## 2015-12-07 VITALS — BP 120/70 | HR 84 | Temp 98.5°F | Ht 66.0 in | Wt 132.5 lb

## 2015-12-07 DIAGNOSIS — Z0000 Encounter for general adult medical examination without abnormal findings: Secondary | ICD-10-CM | POA: Diagnosis not present

## 2015-12-07 NOTE — Progress Notes (Signed)
Dr. Frederico Hamman T. Alesana Magistro, MD, [facility] Primary [facility] Sports Medicine [place], [address] Phone: [phone] Fax: [phone]  12/07/2015  Patient: Shelley Figueroa, MRN: [id number], DOB: 03/15/1976, 40 y.o.  Primary Physician:  Owens Loffler, MD   Chief Complaint  Patient presents with  . Annual Exam   Subjective:   Shelley Figueroa is a 39 y.o. pleasant patient who presents with the following:  Health Maintenance Summary Reviewed and updated, unless pt declines services.  Tobacco History Reviewed. Non-smoker Alcohol: No concerns, no excessive use Exercise Habits: most days of the week STD concerns: none Drug Use: None Birth control method: IUD Menses regular: yes Lumps or breast concerns: no Breast Cancer Family History: no  Switched to a gluten free diet.  Doing really well overall.  Health Maintenance  Topic Date Due  . HIV Screening  09/10/1988  . TETANUS/TDAP  12/06/2016 (Originally 09/10/1992)  . INFLUENZA VACCINE  12/29/2015  . PAP SMEAR  09/25/2017    Immunization History  Administered Date(s) Administered  . Influenza Whole 03/16/2007   Patient Active Problem List   Diagnosis Date Noted  . Asthma, chronic 05/28/2014  . Essential hypertension 05/28/2014  . GERD 09/23/2009  . 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 ALLERGIC RHINITIS 07/06/2009   Past Medical History  Diagnosis Date  . Allergic rhinitis due to pollen   . Sinus problem     chronic  . Depression   . Chronic bronchitis ([place])   . Asthma, chronic 05/28/2014  . Kidney stones   . Essential hypertension 05/28/2014   Past Surgical History  Procedure Laterality Date  . Cesarean section      x2  . Knee surgery    . Septoplasty    . Nasal sinus surgery     Social History   Social History  . Marital Status: Married    Spouse Name: Jonni Sanger  . Number of Children: 2  . Years of Education: N/A   Occupational History  .  Executive     [employer]   Social History Main Topics  . Smoking status: Never Smoker   . Smokeless tobacco: Never Used  . Alcohol Use: 0.0 oz/week    0 Standard drinks or equivalent per week     Comment: occasional  . Drug Use: No  . Sexual Activity:    Partners: Male    Patent examiner Protection: IUD   Other Topics Concern  . Not on file   Social History Narrative   From [place] originally   [employer], [employer] of [employer]   Married to [place]   2 children   Family History  Problem Relation Age of Onset  . Hypertension Mother   . Hyperlipidemia Mother   . Hypertension Father   . Hyperlipidemia Father   . Coronary artery disease Father    Allergies  Allergen Reactions  . Amoxicillin     REACTION: rash  . Codeine     REACTION: itching  . Doxycycline Nausea And Vomiting    Not a true allergy  . Erythromycin     REACTION: nausea and vomiting  . Lisinopril     cough  . Neomycin Nausea And Vomiting  . Penicillins    Medication list has been reviewed and updated.  General: Denies fever, chills, sweats. No significant weight loss. Eyes: Denies blurring,significant itching ENT: Denies earache, sore throat, and hoarseness.  Cardiovascular: Denies chest pains, palpitations, dyspnea on  exertion,  Respiratory: Denies cough, dyspnea at rest,wheeezing Breast: no concerns about lumps GI: Denies nausea, vomiting, diarrhea, constipation, change in bowel habits, abdominal pain, melena, hematochezia GU: Denies dysuria, hematuria, urinary hesitancy, nocturia, denies STD risk, no concerns about discharge Musculoskeletal: Denies back pain, joint pain Derm: Denies rash, itching Neuro: Denies  paresthesias, frequent falls, frequent headaches Psych: Denies depression, anxiety Endocrine: Denies cold intolerance, heat intolerance, polydipsia Heme: Denies enlarged lymph nodes Allergy: No hayfever  Objective:   BP 120/70 mmHg  Pulse 84  Temp(Src) 98.5 F (36.9 C) (Oral)   Ht 5' 6"  (1.676 m)  Wt 132 lb 8 oz (60.102 kg)  BMI 21.40 kg/m2 No exam data present  GEN: well developed, well nourished, no acute distress Eyes: conjunctiva and lids normal, PERRLA, EOMI ENT: TM clear, nares clear, oral exam WNL Neck: supple, no lymphadenopathy, no thyromegaly, no JVD Pulm: clear to auscultation and percussion, respiratory effort normal CV: regular rate and rhythm, S1-S2, no murmur, rub or gallop, no bruits Chest: no scars, masses, no lumps BREAST: breast exam declined GI: soft, non-tender; no hepatosplenomegaly, masses; active bowel sounds all quadrants GU: GU exam declined Lymph: no cervical, axillary or inguinal adenopathy MSK: gait normal, muscle tone and strength WNL, no joint swelling, effusions, discoloration, crepitus  SKIN: clear, good turgor, color WNL, no rashes, lesions, or ulcerations Neuro: normal mental status, normal strength, sensation, and motion Psych: alert; oriented to person, place and time, normally interactive and not anxious or depressed in appearance.   All labs reviewed with patient. Lipids:    Component Value Date/Time   CHOL 212* 11/30/2015 0834   TRIG 95.0 11/30/2015 0834   HDL 53.30 11/30/2015 0834   VLDL 19.0 11/30/2015 0834   CHOLHDL 4 11/30/2015 0834   CBC: CBC Latest Ref Rng 11/30/2015 07/20/2014 05/28/2014  WBC 4.0 - 10.5 K/uL 7.0 9.2 6.0  Hemoglobin 12.0 - 15.0 g/dL 13.0 13.8 13.1  Hematocrit 36.0 - 46.0 % 37.8 39.9 38.9  Platelets 150.0 - 400.0 K/uL 262.0 313 361.4    Basic Metabolic Panel:    Component Value Date/Time   NA 139 11/30/2015 0834   K 3.6 11/30/2015 0834   CL 104 11/30/2015 0834   CO2 31 11/30/2015 0834   BUN 21 11/30/2015 0834   CREATININE 0.84 11/30/2015 0834   GLUCOSE 96 11/30/2015 0834   CALCIUM 8.8 11/30/2015 0834   Hepatic Function Latest Ref Rng 11/30/2015 05/28/2014 01/08/2010  Total Protein 6.0 - 8.3 g/dL 6.4 6.8 7.0  Albumin 3.5 - 5.2 g/dL 4.1 4.2 4.4  AST 0 - 37 U/L 21 16 16   ALT 0 -  35 U/L 21 12 13   Alk Phosphatase 39 - 117 U/L 50 56 63  Total Bilirubin 0.2 - 1.2 mg/dL 0.5 0.5 0.7  Bilirubin, Direct 0.0 - 0.3 mg/dL 0.1 0.0 0.1    Lab Results  Component Value Date   TSH 3.12 11/30/2015    Assessment and Plan:   Healthcare maintenance  Health Maintenance Exam: The patient's preventative maintenance and recommended screening tests for an annual wellness exam were reviewed in full today. Brought up to date unless services declined.  Counselled on the importance of diet, exercise, and its role in overall health and mortality. The patient's FH and SH was reviewed, including their home life, tobacco status, and drug and alcohol status.  Follow-up: No Follow-up on file. Or follow-up in 1 year for complete physical examination  Signed,  Frederico Hamman T. Aarian Griffie, MD   Patient's Medications  New Prescriptions  No medications on file  Previous Medications   BUPROPION (WELLBUTRIN XL) 300 MG 24 HR TABLET    Take 300 mg by mouth daily.     CETIRIZINE (ZYRTEC) 10 MG TABLET    Take 10 mg by mouth daily.     ESZOPICLONE 3 MG TABS    Take 3 mg by mouth at bedtime. Take immediately before bedtime   FLUTICASONE FUROATE (ARNUITY ELLIPTA) 100 MCG/ACT AEPB    Inhale 1 puff into the lungs daily.   GUAIFENESIN PO    Take by mouth.   HYDROCHLOROTHIAZIDE (MICROZIDE) 12.5 MG CAPSULE    Take 1 capsule (12.5 mg total) by mouth daily. **Must have follow up for further refills**   L-LYSINE HCL 1000 MG TABS    Take by mouth daily.   METHOCARBAMOL (ROBAXIN) 500 MG TABLET    Take 1 tablet (500 mg total) by mouth every 8 (eight) hours as needed for muscle spasms.   SPACER/AERO-HOLDING CHAMBERS DEVI    Use as directed  Modified Medications   No medications on file  Discontinued Medications   No medications on file

## 2015-12-07 NOTE — Progress Notes (Signed)
Pre visit review using our clinic review tool, if applicable. No additional management support is needed unless otherwise documented below in the visit note. 

## 2016-01-17 ENCOUNTER — Other Ambulatory Visit: Payer: Self-pay | Admitting: Family Medicine

## 2016-02-02 IMAGING — DX DG CHEST 2V
2 series · 2 of 2 positions shown · non-contrast
Comparison: 10/27/2009

CLINICAL DATA: Cough with chest tightness and sore throat.
Diagnosed with bronchitis 10 days ago.

EXAM:
CHEST  2 VIEW

[chest pa]
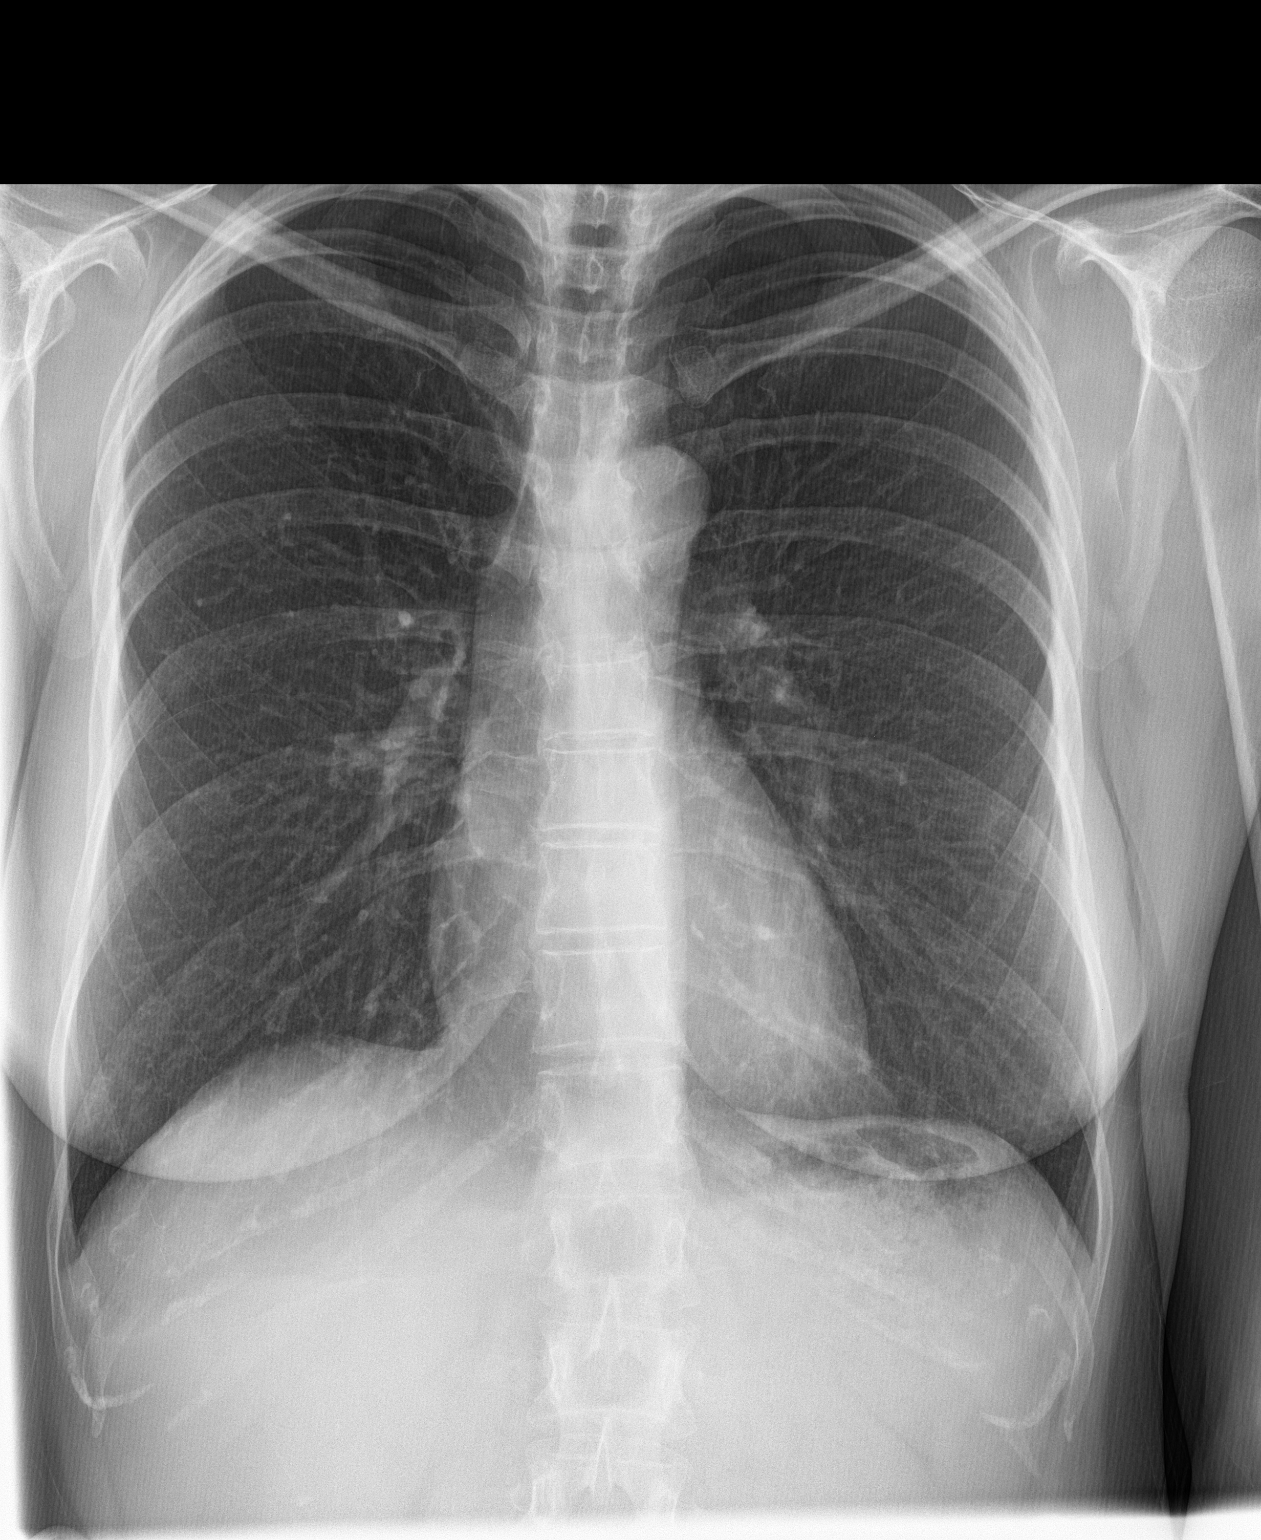

[chest lat]
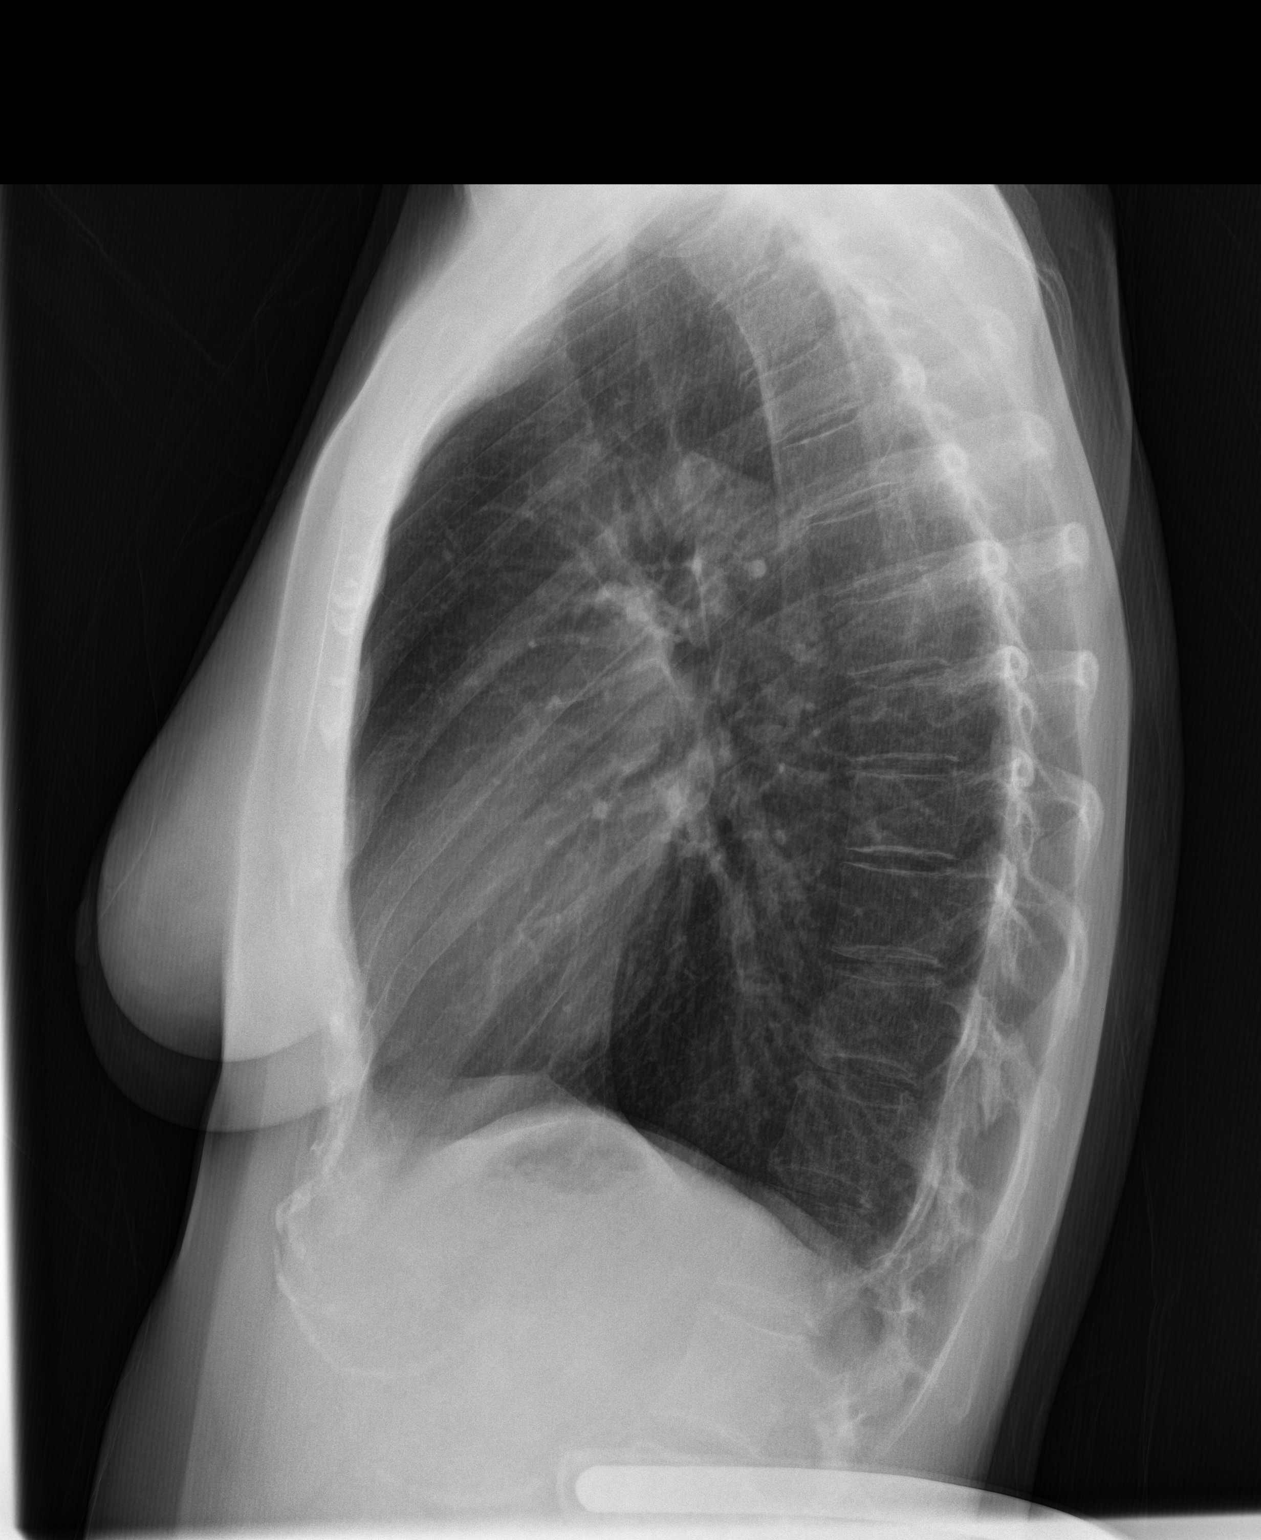

[2 of 2 positions shown; findings below may reference images not displayed]

FINDINGS: The heart size and mediastinal contours are within normal limits.
Both lungs are clear. The visualized skeletal structures are
unremarkable.
IMPRESSION: No active cardiopulmonary disease.

## 2016-06-28 ENCOUNTER — Other Ambulatory Visit: Payer: Self-pay | Admitting: Family Medicine

## 2016-06-28 ENCOUNTER — Telehealth: Payer: Self-pay

## 2016-06-28 MED ORDER — OSELTAMIVIR PHOSPHATE 75 MG PO CAPS: 75.0000 mg | ORAL_CAPSULE | Freq: Two times a day (BID) | ORAL | 0 refills | Status: DC

## 2016-06-28 NOTE — Telephone Encounter (Signed)
I cancelled patient's appointment for tomorrow.

## 2016-06-28 NOTE — Telephone Encounter (Signed)
I spoke to her husband Shelley Figueroa. 2 children with flu + tests, flu-like symptoms, arthralgias.   Lyla SonCarrie, can you please cancel her appointment tomorrow and open this up for another patient.    I have called her in some Tamiflu. Almost certainly has influenza. I would prefer her to stay at home and rest.

## 2016-06-28 NOTE — Telephone Encounter (Signed)
PLEASE NOTE: All timestamps contained within this report are represented as [place] Standard Time. CONFIDENTIALTY NOTICE: This fax transmission is intended only for the addressee. It contains information that is legally privileged, confidential or otherwise protected from use or disclosure. If you are not the intended recipient, you are strictly prohibited from reviewing, disclosing, copying using or disseminating any of this information or taking any action in reliance on or regarding this information. If you have received this fax in error, please notify us immediately by telephone so that we can arrange for its return to us. Phone: [phone], Toll-Free: [phone], Fax: [phone] Page: 1 of 2 Call Id: [id number] [facility] Client TELEPHONE ADVICE RECORD [facility] Patient Name: Shelley GammaHEATHER ARMSTRON G Gender: Female DOB: 10-25-1975 Age: [age] Y 9 M 14 D Return Phone Number: [phone] (Primary), [phone] (Secondary) City/State/Zip: [place] Client [facility] - Night Physician Copland, Karleen HampshireSpencer - MD Who Is Calling Patient / Member / Family / Caregiver Call Type Triage / Clinical Caller Name Clarisa Flingndy Chaikin Relationship To Patient Spouse Return Phone Number [phone] (Primary) Chief Complaint Flu Symptom Reason for Call Symptomatic / Request for Health Information Initial Comment Chart 1 of 2: Caller states that his children have tested positive for flu and wants to know if he and his wife need to be seen. She has started to not feel well. Nurse Assessment Nurse: Doylene Canardonner, RN, Lesa Date/Time Lamount Cohen(Eastern Time): 06/27/2016 7:27:05 PM Confirm and document reason for call. If symptomatic, describe symptoms. ---Caller states she has been exposed to influenza and she now has flu symptoms Does the PT have any chronic conditions? (i.e. diabetes, asthma,  etc.) ---Yes List chronic conditions. ---HTN Is the patient pregnant or possibly pregnant? (Ask all females between the ages of 1412-55) ---No Guidelines Guideline Title Affirmed Question Influenza - Seasonal Earache Disp. Time Lamount Cohen(Eastern Time) Disposition Final User 06/27/2016 7:31:57 PM See Physician within 24 Hours Yes Conner, RN, Lesa Referrals REFERRED TO PCP OFFICE Care Advice Given Per Guideline SEE PHYSICIAN WITHIN 24 HOURS: * IF OFFICE WILL BE OPEN: You need to be seen within the next 24 hours. Call your doctor when the office opens, and make an appointment. INFLUENZA - GENERAL CARE ADVICE * Cough: Use cough drops. * Feeling dehydrated: Drink extra liquids. If the air in your home is dry, use a humidifier. * Muscle aches, headache, and other pains: Often this comes and goes with the fever. Take acetaminophen every 4-6 hours (Adults 650 mg) OR ibuprofen every 6-8 hours (Adults 400-600 mg). * Sore throat: Try throat lozenges, hard candy or warm chicken broth. PAIN MEDICINES: * For pain relief, take acetaminophen, ibuprofen, or naproxen. * Use the lowest amount that makes your pain feel better. ACETAMINOPHEN (E.G., TYLENOL): IBUPROFEN (E.G., MOTRIN, ADVIL): NAPROXEN (E.G., ALEVE): * Before taking any medicine, read all the instructions on the package. COUGHING SPELLS: * Drink warm fluids. Inhale warm mist. (Reason: both relax the airway and PLEASE NOTE: All timestamps contained within this report are represented as [place] Standard Time. CONFIDENTIALTY NOTICE: This fax transmission is intended only for the addressee. It contains information that is legally privileged, confidential or otherwise protected from use or disclosure. If you are not the intended recipient, you are strictly prohibited from reviewing, disclosing, copying using or disseminating any of this information or taking any action in reliance on or regarding this information. If you have received this fax in error,  please  notify us immediately by telephone so that we can arrange for its return to [place]. Phone: [phone], Toll-Free: [phone], Fax: [phone] Page: 2 of 2 Call Id: [id number] Care Advice Given Per Guideline loosen up the phlegm) * Suck on cough drops or hard candy to coat the irritated throat. CALL BACK IF: * Fever over 104 F (40 C) * Difficulty breathing occurs * You become worse. CARE ADVICE given per INFLUENZA - SEASONAL (Adult) guideline.

## 2016-06-28 NOTE — Telephone Encounter (Signed)
Mr Shelley Figueroa said pt has some symptoms of flu; non prod cough, tightness in chest, temp 100, achy and tired. [facility] Offered pt multiple appts today at another [facility] site but pt could not miss meetings at work today and scheduled 06/29/16 at 9:30 with Dr Patsy Lageropland.

## 2016-06-29 ENCOUNTER — Ambulatory Visit: Payer: Managed Care, Other (non HMO) | Admitting: Family Medicine

## 2016-10-03 ENCOUNTER — Other Ambulatory Visit: Payer: Self-pay | Admitting: Obstetrics & Gynecology

## 2016-10-03 DIAGNOSIS — R928 Other abnormal and inconclusive findings on diagnostic imaging of breast: Secondary | ICD-10-CM

## 2016-10-05 ENCOUNTER — Ambulatory Visit
Admission: RE | Admit: 2016-10-05 | Discharge: 2016-10-05 | Disposition: A | Discharge status: Home / Self Care | Payer: 59 | Source: Ambulatory Visit | Attending: Obstetrics & Gynecology | Admitting: Obstetrics & Gynecology

## 2016-10-05 DIAGNOSIS — R928 Other abnormal and inconclusive findings on diagnostic imaging of breast: Secondary | ICD-10-CM

## 2016-12-23 ENCOUNTER — Other Ambulatory Visit: Payer: Self-pay | Admitting: Family Medicine

## 2016-12-28 ENCOUNTER — Ambulatory Visit (INDEPENDENT_AMBULATORY_CARE_PROVIDER_SITE_OTHER): Payer: 59 | Admitting: Primary Care

## 2016-12-28 ENCOUNTER — Encounter: Payer: Self-pay | Admitting: Primary Care

## 2016-12-28 VITALS — BP 116/78 | HR 86 | Temp 98.8°F | Ht 66.0 in | Wt 137.8 lb

## 2016-12-28 DIAGNOSIS — Z0000 Encounter for general adult medical examination without abnormal findings: Secondary | ICD-10-CM | POA: Insufficient documentation

## 2016-12-28 DIAGNOSIS — F4323 Adjustment disorder with mixed anxiety and depressed mood: Secondary | ICD-10-CM | POA: Diagnosis not present

## 2016-12-28 DIAGNOSIS — I10 Essential (primary) hypertension: Secondary | ICD-10-CM | POA: Diagnosis not present

## 2016-12-28 DIAGNOSIS — Z0001 Encounter for general adult medical examination with abnormal findings: Secondary | ICD-10-CM | POA: Insufficient documentation

## 2016-12-28 MED ORDER — HYDROCHLOROTHIAZIDE 12.5 MG PO CAPS: 12.5000 mg | ORAL_CAPSULE | Freq: Every day | ORAL | 3 refills | Status: DC

## 2016-12-28 NOTE — Addendum Note (Signed)
Addended by: Alvina ChouWALSH, TERRI J on: 12/28/2016 01:54 PM   Modules accepted: Orders

## 2016-12-28 NOTE — Assessment & Plan Note (Signed)
Immunizations UTD. Pap and mammogram UTD. Commended her on healthy diet and regular exercise. Exam today unremarkable. Labs pending. Follow up in 1 year.

## 2016-12-28 NOTE — Addendum Note (Signed)
Addended by: Tawnya CrookSAMBATH, Tamzin Bertling on: 12/28/2016 12:32 PM   Modules accepted: Orders

## 2016-12-28 NOTE — Progress Notes (Signed)
Subjective:    Patient ID: Shelley Figueroa, female    DOB: [birth year], 41 y.o.   MRN: [id number]  HPI  Ms. Shelley Figueroa is a 41 year old female who presents today to transfer care from Dr. Patsy Lageropland and also for complete physical.  1) Anxiety and Depression: Currently managed on bupropion XL 300 mg. She feels well managed on this medication. Currently following with psychiatrist every 6 months. Also managed on Lunesta 3 mg per psychiatry. Has difficulty falling and staying asleep without the medication. Feels well managed. Denies SI/HI.   2) Essential Hypertension: Diagnosed 2 years ago. Strong family history of hypertension in both parents. Heart disease in her father.  Currently managed on HCTZ 12.5 mg.  Immunizations: -Tetanus: Completed in 2010. -Influenza: Completes annually.    Diet: Has been gluten free since February 2017. Breakfast: Smoothie, homemade, toast with peanut butter, egg, bacon Lunch: Sometimes skips, left overs, peanut butter sandwich Dinner: Restaurants, chicken, vegetables, salads Snacks: Snickers, Reeces, Ice cream Desserts: 2-3 times weekly Beverages: Coffee, some Coke, water, hot chocolate   Exercise: Did not exercise regularly, but has been walking recently.  Eye exam: Completed in July 2018 Dental exam: Completes every 4 months.  Pap Smear: Completed in April 2018, normal. Mammogram: Completed diagnostic mammogram in May 2018. Negative. Due in 1 year.    Review of Systems  Constitutional: Negative for unexpected weight change.  HENT: Negative for rhinorrhea.   Respiratory: Negative for cough and shortness of breath.   Cardiovascular: Negative for chest pain.  Gastrointestinal: Negative for constipation and diarrhea.  Genitourinary: Negative for difficulty urinating and menstrual problem.  Musculoskeletal: Negative for arthralgias and myalgias.  Skin: Negative for rash.  Allergic/Immunologic: Negative for environmental allergies.  Neurological:  Negative for dizziness, numbness and headaches.  Psychiatric/Behavioral:       Denies concerns for anxiety or depression       Past Medical History:  Diagnosis Date  . Allergic rhinitis due to pollen   . Asthma, chronic 05/28/2014  . Chronic bronchitis (HCC)   . Depression   . Essential hypertension 05/28/2014  . Kidney stones   . Sinus problem    chronic     Social History   Social History  . Marital status: Married    Spouse name: Mardelle Mattendy  . Number of children: 2  . Years of education: N/A   Occupational History  . Executive     [employer]   Social History Main Topics  . Smoking status: Never Smoker  . Smokeless tobacco: Never Used  . Alcohol use 0.0 oz/week     Comment: occasional  . Drug use: No  . Sexual activity: Yes    Partners: Male    Birth control/ protection: IUD   Other Topics Concern  . Not on file   Social History Narrative   [place]   [employer]   Married to [employer] Internationalndy Baucom   2 children    Past Surgical History:  Procedure Laterality Date  . CESAREAN SECTION     x2  . KNEE SURGERY    . NASAL SINUS SURGERY    . SEPTOPLASTY      Family History  Problem Relation Age of Onset  . Hypertension Mother   . Hyperlipidemia Mother   . Hypertension Father   . Hyperlipidemia Father   . Coronary artery disease Father     Allergies  Allergen Reactions  . Amoxicillin     REACTION: rash  .  Codeine     REACTION: itching  . Doxycycline Nausea And Vomiting    Not a true allergy  . Erythromycin     REACTION: nausea and vomiting  . Lisinopril     cough  . Neomycin Nausea And Vomiting  . Penicillins     Current Outpatient Prescriptions on File Prior to Visit  Medication Sig Dispense Refill  . buPROPion (WELLBUTRIN XL) 300 MG 24 hr tablet Take 300 mg by mouth daily.      . cetirizine (ZYRTEC) 10 MG tablet Take 10 mg by mouth daily.      . Eszopiclone 3 MG TABS Take 3 mg by mouth at bedtime. Take immediately  before bedtime    . Fluticasone Furoate (ARNUITY ELLIPTA) 100 MCG/ACT AEPB Inhale 1 puff into the lungs daily.    . GUAIFENESIN PO Take by mouth.    . L-Lysine HCl 1000 MG TABS Take by mouth daily.    . methocarbamol (ROBAXIN) 500 MG tablet Take 1 tablet (500 mg total) by mouth every 8 (eight) hours as needed for muscle spasms. 30 tablet 1  . oseltamivir (TAMIFLU) 75 MG capsule Take 1 capsule (75 mg total) by mouth 2 (two) times daily. 10 capsule 0  . Spacer/Aero-Holding Rudean Curthambers DEVI Use as directed 1 each 1   No current facility-administered medications on file prior to visit.     BP 116/78   Pulse 86   Temp 98.8 F (37.1 C) (Oral)   Ht 5\' 6"  (1.676 m)   Wt 137 lb 12.8 oz (62.5 kg)   SpO2 98%   BMI 22.24 kg/m    Objective:   Physical Exam  Constitutional: She is oriented to person, place, and time. She appears well-nourished.  HENT:  Right Ear: Tympanic membrane and ear canal normal.  Left Ear: Tympanic membrane and ear canal normal.  Nose: Nose normal.  Mouth/Throat: Oropharynx is clear and moist.  Eyes: Pupils are equal, round, and reactive to light. Conjunctivae and EOM are normal.  Neck: Neck supple. No thyromegaly present.  Cardiovascular: Normal rate and regular rhythm.   No murmur heard. Pulmonary/Chest: Effort normal and breath sounds normal. She has no rales.  Abdominal: Soft. Bowel sounds are normal. There is no tenderness.  Musculoskeletal: Normal range of motion.  Lymphadenopathy:    She has no cervical adenopathy.  Neurological: She is alert and oriented to person, place, and time. She has normal reflexes. No cranial nerve deficit.  Skin: Skin is warm and dry. No rash noted.  Psychiatric: She has a normal mood and affect.          Assessment & Plan:

## 2016-12-28 NOTE — Assessment & Plan Note (Signed)
Stable overall, has noticed elevated readings several weeks ago which have now reduced with regular exercise. Continue HCTZ 12.5 mg.

## 2016-12-28 NOTE — Assessment & Plan Note (Signed)
Follow with psychiatry, feels well managed on bupropion and Lunesta. Denies SI/HI.

## 2016-12-28 NOTE — Patient Instructions (Signed)
Continue your efforts towards a healthy diet.  Continue exercising. You should be getting 150 minutes of moderate intensity exercise weekly.  Schedule a lab only appointment to return for fasting labs. I will notify you of your results once received.  Follow up in 1 year for your annual physical or sooner if needed.  It was a pleasure to see you today!

## 2016-12-30 ENCOUNTER — Other Ambulatory Visit (INDEPENDENT_AMBULATORY_CARE_PROVIDER_SITE_OTHER): Payer: 59

## 2016-12-30 DIAGNOSIS — I10 Essential (primary) hypertension: Secondary | ICD-10-CM

## 2016-12-30 LAB — COMPREHENSIVE METABOLIC PANEL
ALBUMIN: 4.1 g/dL (ref 3.5–5.2)
ALT: 23 U/L (ref 0–35)
AST: 21 U/L (ref 0–37)
Alkaline Phosphatase: 44 U/L (ref 39–117)
BILIRUBIN TOTAL: 0.5 mg/dL (ref 0.2–1.2)
BUN: 9 mg/dL (ref 6–23)
CO2: 32 mEq/L (ref 19–32)
Calcium: 8.7 mg/dL (ref 8.4–10.5)
Chloride: 102 mEq/L (ref 96–112)
Creatinine, Ser: 0.85 mg/dL (ref 0.40–1.20)
GFR: 77.47 mL/min (ref >60.00)
Glucose, Bld: 94 mg/dL (ref 70–99)
Potassium: 3.4 mEq/L — ABNORMAL LOW (ref 3.5–5.1)
Sodium: 139 mEq/L (ref 135–145)
Total Protein: 6.2 g/dL (ref 6.0–8.3)

## 2016-12-30 LAB — LIPID PANEL
CHOLESTEROL: 192 mg/dL (ref 0–200)
HDL: 56.4 mg/dL (ref >39.00)
LDL Cholesterol: 121 mg/dL — ABNORMAL HIGH (ref 0–99)
NonHDL: 135.82
TRIGLYCERIDES: 76 mg/dL (ref 0.0–149.0)
Total CHOL/HDL Ratio: 3
VLDL: 15.2 mg/dL (ref 0.0–40.0)

## 2017-12-07 ENCOUNTER — Telehealth: Payer: Self-pay | Admitting: Family Medicine

## 2017-12-07 NOTE — Telephone Encounter (Signed)
Shelley Figueroa notified immunization record is ready to be picked up at the front desk.

## 2017-12-07 NOTE — Telephone Encounter (Signed)
Pt is requesting immunization records. Can reach her on [phone] when they're ready to be picked up.

## 2018-01-06 ENCOUNTER — Other Ambulatory Visit: Payer: Self-pay | Admitting: Primary Care

## 2018-01-06 DIAGNOSIS — I10 Essential (primary) hypertension: Secondary | ICD-10-CM

## 2018-02-25 ENCOUNTER — Encounter (HOSPITAL_COMMUNITY): Payer: Self-pay | Admitting: Emergency Medicine

## 2018-02-25 ENCOUNTER — Ambulatory Visit (HOSPITAL_COMMUNITY)
Admission: EM | Admit: 2018-02-25 | Discharge: 2018-02-25 | Disposition: A | Discharge status: Home / Self Care | Payer: 59 | Attending: Internal Medicine | Admitting: Internal Medicine

## 2018-02-25 DIAGNOSIS — N3090 Cystitis, unspecified without hematuria: Secondary | ICD-10-CM

## 2018-02-25 DIAGNOSIS — Z87440 Personal history of urinary (tract) infections: Secondary | ICD-10-CM | POA: Diagnosis not present

## 2018-02-25 DIAGNOSIS — Z885 Allergy status to narcotic agent status: Secondary | ICD-10-CM | POA: Insufficient documentation

## 2018-02-25 DIAGNOSIS — J45909 Unspecified asthma, uncomplicated: Secondary | ICD-10-CM | POA: Diagnosis not present

## 2018-02-25 DIAGNOSIS — Z79899 Other long term (current) drug therapy: Secondary | ICD-10-CM | POA: Diagnosis not present

## 2018-02-25 DIAGNOSIS — R300 Dysuria: Secondary | ICD-10-CM | POA: Diagnosis present

## 2018-02-25 DIAGNOSIS — Z888 Allergy status to other drugs, medicaments and biological substances status: Secondary | ICD-10-CM | POA: Insufficient documentation

## 2018-02-25 DIAGNOSIS — Z87442 Personal history of urinary calculi: Secondary | ICD-10-CM | POA: Insufficient documentation

## 2018-02-25 DIAGNOSIS — Z880 Allergy status to penicillin: Secondary | ICD-10-CM | POA: Diagnosis not present

## 2018-02-25 DIAGNOSIS — Z3202 Encounter for pregnancy test, result negative: Secondary | ICD-10-CM

## 2018-02-25 DIAGNOSIS — I10 Essential (primary) hypertension: Secondary | ICD-10-CM | POA: Insufficient documentation

## 2018-02-25 LAB — POCT URINALYSIS DIP (DEVICE)
BILIRUBIN URINE: NEGATIVE
GLUCOSE, UA: NEGATIVE mg/dL
Ketones, ur: NEGATIVE mg/dL
Nitrite: NEGATIVE
Protein, ur: NEGATIVE mg/dL
SPECIFIC GRAVITY, URINE: 1.015 (ref 1.005–1.030)
Urobilinogen, UA: 0.2 mg/dL (ref 0.0–1.0)
pH: 7.5 (ref 5.0–8.0)

## 2018-02-25 LAB — POCT PREGNANCY, URINE: Preg Test, Ur: NEGATIVE

## 2018-02-25 MED ORDER — CEPHALEXIN 500 MG PO CAPS: 500.0000 mg | ORAL_CAPSULE | Freq: Three times a day (TID) | ORAL | 0 refills | Status: DC

## 2018-02-25 MED ORDER — FLUCONAZOLE 150 MG PO TABS: 150.0000 mg | ORAL_TABLET | ORAL | 0 refills | Status: DC

## 2018-02-25 NOTE — ED Triage Notes (Signed)
Pt c/o frequency with urination, burning, urgency x1 week.

## 2018-02-25 NOTE — ED Provider Notes (Signed)
MRN: [id number] DOB: [birth year]  Subjective:   Shelley Figueroa is a 42 y.o. female presenting for 1 week history of dysuria, urinary frequency, urinary urgency. Has tried to hydrated very well. Has a history of UTIs, this episode feels very much the same.   No current facility-administered medications for this encounter.   Current Outpatient Medications:  .  ARNUITY ELLIPTA 100 MCG/ACT AEPB, Inhale 1 puff into the lungs daily., Disp: , Rfl:  .  buPROPion (WELLBUTRIN XL) 300 MG 24 hr tablet, Take 300 mg by mouth daily.  , Disp: , Rfl:  .  cetirizine (ZYRTEC) 10 MG tablet, Take 10 mg by mouth daily.  , Disp: , Rfl:  .  Eszopiclone 3 MG TABS, Take 3 mg by mouth at bedtime. Take immediately before bedtime, Disp: , Rfl:  .  hydrochlorothiazide (MICROZIDE) 12.5 MG capsule, Take 1 capsule (12.5 mg total) by mouth daily. NEED APPOINTMENT FOR ANY MORE REFILLS, Disp: 90 capsule, Rfl: 0 .  L-Lysine HCl 1000 MG TABS, Take by mouth daily., Disp: , Rfl:  .  Spacer/Aero-Holding Chambers DEVI, Use as directed, Disp: 1 each, Rfl: 1   Allergies  Allergen Reactions  . Amoxicillin     REACTION: rash  . Codeine     REACTION: itching  . Doxycycline Nausea And Vomiting    Not a true allergy  . Erythromycin     REACTION: nausea and vomiting  . Lisinopril     cough  . Neomycin Nausea And Vomiting  . Penicillins     Past Medical History:  Diagnosis Date  . Allergic rhinitis due to pollen   . Asthma, chronic 05/28/2014  . Chronic bronchitis (HCC)   . Depression   . Essential hypertension 05/28/2014  . Kidney stones   . Sinus problem    chronic     Past Surgical History:  Procedure Laterality Date  . CESAREAN SECTION     x2  . KNEE SURGERY    . NASAL SINUS SURGERY    . SEPTOPLASTY      Objective:   Vitals: BP (!) 139/98   Pulse 92   Temp 98.4 F (36.9 C)   Resp 16   SpO2 99%   Physical Exam  Constitutional: She is oriented to person, place, and time. She appears  well-developed and well-nourished.  Cardiovascular: Normal rate.  Pulmonary/Chest: Effort normal.  Neurological: She is alert and oriented to person, place, and time.    Results for orders placed or performed during the hospital encounter of 02/25/18 (from the past 24 hour(s))  POCT urinalysis dip (device)     Status: Abnormal   Collection Time: 02/25/18  6:01 PM  Result Value Ref Range   Glucose, UA NEGATIVE NEGATIVE mg/dL   Bilirubin Urine NEGATIVE NEGATIVE   Ketones, ur NEGATIVE NEGATIVE mg/dL   Specific Gravity, Urine 1.015 1.005 - 1.030   Hgb urine dipstick TRACE (A) NEGATIVE   pH 7.5 5.0 - 8.0   Protein, ur NEGATIVE NEGATIVE mg/dL   Urobilinogen, UA 0.2 0.0 - 1.0 mg/dL   Nitrite NEGATIVE NEGATIVE   Leukocytes, UA SMALL (A) NEGATIVE  Pregnancy, urine POC     Status: None   Collection Time: 02/25/18  6:03 PM  Result Value Ref Range   Preg Test, Ur NEGATIVE NEGATIVE    Assessment and Plan :   Cystitis  History of UTI  Treat for UTI with Keflex given medication allergies. Use fluconazole for antibiotic associated yeast infection. Counseled patient on potential for adverse  effects with medications prescribed today, patient verbalized understanding. Return-to-clinic precautions discussed, patient verbalized understanding.    Wallis Bamberg, PA-C 03/05/18 1130

## 2018-02-27 LAB — URINE CULTURE

## 2018-03-20 ENCOUNTER — Other Ambulatory Visit: Payer: Self-pay | Admitting: Primary Care

## 2018-03-20 DIAGNOSIS — Z0000 Encounter for general adult medical examination without abnormal findings: Secondary | ICD-10-CM

## 2018-03-30 ENCOUNTER — Other Ambulatory Visit (INDEPENDENT_AMBULATORY_CARE_PROVIDER_SITE_OTHER): Payer: 59

## 2018-03-30 DIAGNOSIS — Z0000 Encounter for general adult medical examination without abnormal findings: Secondary | ICD-10-CM | POA: Diagnosis not present

## 2018-03-30 LAB — LIPID PANEL
CHOL/HDL RATIO: 4
Cholesterol: 220 mg/dL — ABNORMAL HIGH (ref 0–200)
HDL: 54 mg/dL (ref >39.00)
LDL Cholesterol: 147 mg/dL — ABNORMAL HIGH (ref 0–99)
NONHDL: 166.4
Triglycerides: 99 mg/dL (ref 0.0–149.0)
VLDL: 19.8 mg/dL (ref 0.0–40.0)

## 2018-03-30 LAB — COMPREHENSIVE METABOLIC PANEL
ALT: 21 U/L (ref 0–35)
AST: 16 U/L (ref 0–37)
Albumin: 4.3 g/dL (ref 3.5–5.2)
Alkaline Phosphatase: 50 U/L (ref 39–117)
BUN: 21 mg/dL (ref 6–23)
CO2: 29 meq/L (ref 19–32)
CREATININE: 0.99 mg/dL (ref 0.40–1.20)
Calcium: 8.9 mg/dL (ref 8.4–10.5)
Chloride: 102 mEq/L (ref 96–112)
GFR: 64.6 mL/min (ref >60.00)
GLUCOSE: 102 mg/dL — AB (ref 70–99)
Potassium: 3.6 mEq/L (ref 3.5–5.1)
SODIUM: 139 meq/L (ref 135–145)
Total Bilirubin: 0.7 mg/dL (ref 0.2–1.2)
Total Protein: 6.6 g/dL (ref 6.0–8.3)

## 2018-04-05 ENCOUNTER — Other Ambulatory Visit: Payer: Self-pay | Admitting: Primary Care

## 2018-04-05 DIAGNOSIS — I10 Essential (primary) hypertension: Secondary | ICD-10-CM

## 2018-04-06 ENCOUNTER — Ambulatory Visit (INDEPENDENT_AMBULATORY_CARE_PROVIDER_SITE_OTHER): Payer: 59 | Admitting: Primary Care

## 2018-04-06 ENCOUNTER — Encounter: Payer: Self-pay | Admitting: Primary Care

## 2018-04-06 VITALS — BP 120/78 | HR 72 | Temp 98.3°F | Ht 66.0 in | Wt 139.2 lb

## 2018-04-06 DIAGNOSIS — I10 Essential (primary) hypertension: Secondary | ICD-10-CM

## 2018-04-06 DIAGNOSIS — E785 Hyperlipidemia, unspecified: Secondary | ICD-10-CM

## 2018-04-06 DIAGNOSIS — J4520 Mild intermittent asthma, uncomplicated: Secondary | ICD-10-CM | POA: Diagnosis not present

## 2018-04-06 DIAGNOSIS — J309 Allergic rhinitis, unspecified: Secondary | ICD-10-CM

## 2018-04-06 DIAGNOSIS — Z0000 Encounter for general adult medical examination without abnormal findings: Secondary | ICD-10-CM

## 2018-04-06 DIAGNOSIS — F4323 Adjustment disorder with mixed anxiety and depressed mood: Secondary | ICD-10-CM | POA: Diagnosis not present

## 2018-04-06 NOTE — Assessment & Plan Note (Signed)
LDL of 147, family history of hyperlipidemia. Discussed to work on diet, start with regular exercise. Continue to monitor.

## 2018-04-06 NOTE — Assessment & Plan Note (Signed)
Doing well with Zyrtec, continue same.

## 2018-04-06 NOTE — Patient Instructions (Signed)
Start exercising. You should be getting 150 minutes of moderate intensity exercise weekly.  Continue to work on [employer] as discussed.   Ensure you are consuming 64 ounces of water daily.  Follow up with psychiatry and gynecology as scheduled.  It was a pleasure to see you today!   Preventive Care 40-64 Years, Female Preventive care refers to lifestyle choices and visits with your health care provider that can promote health and wellness. What does preventive care include?  A yearly physical exam. This is also called an annual well check.  Dental exams once or twice a year.  Routine eye exams. Ask your health care provider how often you should have your eyes checked.  Personal lifestyle choices, including: ? Daily care of your teeth and gums. ? Regular physical activity. ? Eating a healthy diet. ? Avoiding tobacco and drug use. ? Limiting alcohol use. ? Practicing safe sex. ? Taking low-dose aspirin daily starting at age 65. ? Taking vitamin and mineral supplements as recommended by your health care provider. What happens during an annual well check? The services and screenings done by your health care provider during your annual well check will depend on your age, overall health, lifestyle risk factors, and family history of disease. Counseling Your health care provider may ask you questions about your:  Alcohol use.  Tobacco use.  Drug use.  Emotional well-being.  Home and relationship well-being.  Sexual activity.  Eating habits.  Work and work Statistician.  Method of birth control.  Menstrual cycle.  Pregnancy history.  Screening You may have the following tests or measurements:  Height, weight, and BMI.  Blood pressure.  Lipid and cholesterol levels. These may be checked every 5 years, or more frequently if you are over 52 years old.  Skin check.  Lung cancer screening. You may have this screening every year starting at age 38 if you have a  30-pack-year history of smoking and currently smoke or have quit within the past 15 years.  Fecal occult blood test (FOBT) of the stool. You may have this test every year starting at age 59.  Flexible sigmoidoscopy or colonoscopy. You may have a sigmoidoscopy every 5 years or a colonoscopy every 10 years starting at age 63.  Hepatitis C blood test.  Hepatitis B blood test.  Sexually transmitted disease (STD) testing.  Diabetes screening. This is done by checking your blood sugar (glucose) after you have not eaten for a while (fasting). You may have this done every 1-3 years.  Mammogram. This may be done every 1-2 years. Talk to your health care provider about when you should start having regular mammograms. This may depend on whether you have a family history of breast cancer.  BRCA-related cancer screening. This may be done if you have a family history of breast, ovarian, tubal, or peritoneal cancers.  Pelvic exam and Pap test. This may be done every 3 years starting at age 18. Starting at age 46, this may be done every 5 years if you have a Pap test in combination with an HPV test.  Bone density scan. This is done to screen for osteoporosis. You may have this scan if you are at high risk for osteoporosis.  Discuss your test results, treatment options, and if necessary, the need for more tests with your health care provider. Vaccines Your health care provider may recommend certain vaccines, such as:  Influenza vaccine. This is recommended every year.  Tetanus, diphtheria, and acellular pertussis (Tdap, Td) vaccine.  You may need a Td booster every 10 years.  Varicella vaccine. You may need this if you have not been vaccinated.  Zoster vaccine. You may need this after age 31.  Measles, mumps, and rubella (MMR) vaccine. You may need at least one dose of MMR if you were born in 1957 or later. You may also need a second dose.  Pneumococcal 13-valent conjugate (PCV13) vaccine. You may  need this if you have certain conditions and were not previously vaccinated.  Pneumococcal polysaccharide (PPSV23) vaccine. You may need one or two doses if you smoke cigarettes or if you have certain conditions.  Meningococcal vaccine. You may need this if you have certain conditions.  Hepatitis A vaccine. You may need this if you have certain conditions or if you travel or work in places where you may be exposed to hepatitis A.  Hepatitis B vaccine. You may need this if you have certain conditions or if you travel or work in places where you may be exposed to hepatitis B.  Haemophilus influenzae type b (Hib) vaccine. You may need this if you have certain conditions.  Talk to your health care provider about which screenings and vaccines you need and how often you need them. This information is not intended to replace advice given to you by your health care provider. Make sure you discuss any questions you have with your health care provider. Document Released: 06/12/2015 Document Revised: 02/03/2016 Document Reviewed: 03/17/2015 Elsevier Interactive Patient Education  [employer].

## 2018-04-06 NOTE — Assessment & Plan Note (Signed)
Stable in the office today, continue HCTZ. BMP unremarkable.  

## 2018-04-06 NOTE — Assessment & Plan Note (Signed)
Immunizations UTD. Pap smear and mammogram UTD. Encouraged regular exercise, work on diet. Exam unremarkable. Labs reviewed. Follow up in 1 year for CPE. 

## 2018-04-06 NOTE — Assessment & Plan Note (Signed)
Compliant to her Arnuity Ellipta daily. Using albuterol inhaler sparingly.

## 2018-04-06 NOTE — Assessment & Plan Note (Addendum)
Doing well on Wellbutrin XL, feels well managed. Also doing well on Lunesta, cannot sleep without. Using Xanax sparingly. Continue same. Denies SI/IH. Following with psychiatry.

## 2018-04-06 NOTE — Progress Notes (Signed)
Subjective:    Patient ID: Shelley Figueroa, female    DOB: 12/31/1975, 42 y.o.   MRN: [id number]  HPI  Shelley Figueroa is a 42 year old female who presents today for complete physical.  Immunizations: -Tetanus: Completed in 2019 -Influenza: Completed   Diet: She endorses a fair diet Breakfast: Skips sometimes, smoothie, boiled egg, peanut butter, fruit, cereal,  Lunch: Skips sometimes, salad, smoothie Dinner: Protein, vegetable Snacks: Candy Desserts: Daily  Beverages: Water, soda, coffee  Exercise: She is not exercising  Eye exam: Completed in July 2019 Dental exam: Completes three times annually  Pap Smear: UTD, follows with GYN.  Mammogram: Completed in Spring 2019  BP Readings from Last 3 Encounters:  04/06/18 120/78  02/25/18 (!) 139/98  12/28/16 116/78     Review of Systems  Constitutional: Negative for unexpected weight change.  HENT: Negative for rhinorrhea.   Respiratory: Negative for cough and shortness of breath.   Cardiovascular: Negative for chest pain.  Gastrointestinal: Negative for constipation and diarrhea.  Genitourinary: Negative for difficulty urinating and menstrual problem.  Musculoskeletal: Negative for arthralgias and myalgias.  Skin: Negative for rash.  Allergic/Immunologic: Negative for environmental allergies.  Neurological: Negative for dizziness, numbness and headaches.  Psychiatric/Behavioral:       Feels well managed, following with psychiatry        Past Medical History:  Diagnosis Date  . Allergic rhinitis due to pollen   . Asthma, chronic 05/28/2014  . Chronic bronchitis (HCC)   . Depression   . Essential hypertension 05/28/2014  . Kidney stones   . Sinus problem    chronic     Social History   Socioeconomic History  . Marital status: Married    Spouse name: Mardelle Matte  . Number of children: 2  . Years of education: Not on file  . Highest education level: Not on file  Occupational History  . Occupation: Psychologist, educational    Comment: [employer]  Social Needs  . Financial resource strain: Not on file  . Food insecurity:    Worry: Not on file    Inability: Not on file  . Transportation needs:    Medical: Not on file    Non-medical: Not on file  Tobacco Use  . Smoking status: Never Smoker  . Smokeless tobacco: Never Used  Substance and Sexual Activity  . Alcohol use: Yes    Alcohol/week: 0.0 standard drinks    Comment: occasional  . Drug use: No  . Sexual activity: Yes    Partners: Male    Birth control/protection: IUD  Lifestyle  . Physical activity:    Days per week: Not on file    Minutes per session: Not on file  . Stress: Not on file  Relationships  . Social connections:    Talks on phone: Not on file    Gets together: Not on file    Attends religious service: Not on file    Active member of club or organization: Not on file    Attends meetings of clubs or organizations: Not on file    Relationship status: Not on file  . Intimate partner violence:    Fear of current or ex partner: Not on file    Emotionally abused: Not on file    Physically abused: Not on file    Forced sexual activity: Not on file  Other Topics Concern  . Not on file  Social History Narrative   From [place] originally   [employer] [employer]  Married to [employer]   2 children    Past Surgical History:  Procedure Laterality Date  . CESAREAN SECTION     x2  . KNEE SURGERY    . NASAL SINUS SURGERY    . SEPTOPLASTY      Family History  Problem Relation Age of Onset  . Hypertension Mother   . Hyperlipidemia Mother   . Hypertension Father   . Hyperlipidemia Father   . Coronary artery disease Father     Allergies  Allergen Reactions  . Amoxicillin     REACTION: rash  . Codeine     REACTION: itching  . Doxycycline Nausea And Vomiting    Not a true allergy  . Erythromycin     REACTION: nausea and vomiting  . Lisinopril     cough  . Neomycin Nausea And Vomiting  . Penicillins      Current Outpatient Medications on File Prior to Visit  Medication Sig Dispense Refill  . albuterol (PROVENTIL HFA;VENTOLIN HFA) 108 (90 Base) MCG/ACT inhaler Inhale 2 puffs into the lungs every 6 (six) hours as needed.    . ALPRAZolam (XANAX) 0.25 MG tablet Take 0.25 mg by mouth. Take 1/2 tablet by mouth once daily as needed.    . ARNUITY ELLIPTA 100 MCG/ACT AEPB Inhale 1 puff into the lungs daily.    Marland Kitchen buPROPion (WELLBUTRIN XL) 300 MG 24 hr tablet Take 300 mg by mouth daily.      . cetirizine (ZYRTEC) 10 MG tablet Take 10 mg by mouth daily.      . Eszopiclone 3 MG TABS Take 3 mg by mouth at bedtime. Take immediately before bedtime    . hydrochlorothiazide (MICROZIDE) 12.5 MG capsule Take 1 capsule (12.5 mg total) by mouth daily. NEED APPOINTMENT FOR ANY MORE REFILLS 90 capsule 0  . L-Lysine HCl 1000 MG TABS Take by mouth daily.    Marland Kitchen Spacer/Aero-Holding Rudean Curt Use as directed 1 each 1   No current facility-administered medications on file prior to visit.     BP 120/78   Pulse 72   Temp 98.3 F (36.8 C) (Oral)   Ht 5\' 6"  (1.676 m)   Wt 139 lb 4 oz (63.2 kg)   SpO2 98%   BMI 22.48 kg/m    Objective:   Physical Exam  Constitutional: She is oriented to person, place, and time. She appears well-nourished.  HENT:  Mouth/Throat: No oropharyngeal exudate.  Eyes: Pupils are equal, round, and reactive to light. EOM are normal.  Neck: Neck supple. No thyromegaly present.  Cardiovascular: Normal rate and regular rhythm.  Respiratory: Effort normal and breath sounds normal.  GI: Soft. Bowel sounds are normal. There is no tenderness.  Musculoskeletal: Normal range of motion.  Neurological: She is alert and oriented to person, place, and time.  Skin: Skin is warm and dry.  Psychiatric: She has a normal mood and affect.           Assessment & Plan:

## 2018-04-30 ENCOUNTER — Encounter: Payer: Self-pay | Admitting: Obstetrics & Gynecology

## 2018-06-15 ENCOUNTER — Encounter: Payer: Self-pay | Admitting: Emergency Medicine

## 2018-06-15 DIAGNOSIS — F329 Major depressive disorder, single episode, unspecified: Secondary | ICD-10-CM | POA: Insufficient documentation

## 2018-06-15 DIAGNOSIS — G4700 Insomnia, unspecified: Secondary | ICD-10-CM | POA: Insufficient documentation

## 2018-06-15 DIAGNOSIS — F419 Anxiety disorder, unspecified: Secondary | ICD-10-CM | POA: Insufficient documentation

## 2018-07-05 ENCOUNTER — Encounter: Payer: Self-pay | Admitting: Obstetrics & Gynecology

## 2018-07-05 ENCOUNTER — Other Ambulatory Visit: Payer: Self-pay

## 2018-07-05 ENCOUNTER — Ambulatory Visit (INDEPENDENT_AMBULATORY_CARE_PROVIDER_SITE_OTHER): Payer: 59 | Admitting: Obstetrics & Gynecology

## 2018-07-05 VITALS — BP 118/80 | HR 100 | Resp 18 | Ht 66.0 in | Wt 138.8 lb

## 2018-07-05 DIAGNOSIS — N941 Unspecified dyspareunia: Secondary | ICD-10-CM

## 2018-07-05 DIAGNOSIS — N949 Unspecified condition associated with female genital organs and menstrual cycle: Secondary | ICD-10-CM | POA: Diagnosis not present

## 2018-07-05 MED ORDER — ESTROGENS, CONJUGATED 0.625 MG/GM VA CREA: TOPICAL_CREAM | VAGINAL | 1 refills | Status: DC

## 2018-07-05 NOTE — Progress Notes (Addendum)
43 y.o. G78P1102 Married White or Caucasian female here for new patient appointment.  She is referred from [facility].  Pt reports she's had painful intercourse since she was 16.  Remembers seeing a provider at [facility] when she was 27 and had a discussion about it that many years ago.  Occasionally has pain with insertion but this is not common.  Mostly, the pain is deeper with intercourse where she often ends up with a dryness or burning sensation.  Does have some issues with specific soaps and skin issues.  Has used a lubricant for years.  Currently using astroglide.  Just doesn't seem to last long enough.  Has discussed this over the years with several providers who always tell her the same thing--use more lubricant.  Did have fertility issues and she had evaluation including a SHGM.  She did use clomid.  IUIs worked for the first pregnancy and the second pregnancy occurred spontaneously.  She's used and IUD for contraception since that time.   She's had a Mirena IUD since the delivery of her last child.  She does not cycle and this is her third Mirena IUD.  Last was inserted by Dr. Chevis Pretty.  Last insertion was very painful.  She has discussed have the next one removed and inserted in the OR.  Reports she has decreased libido with OCPs in the past.  This improved with stopping OCPs and she never wants to use them again.  Intercourse issues have caused lots of marital issues.  Feels therapy has helped and that marriage is improving.  Of course, resolving this issue would be very helpful as well.   No LMP recorded. (Menstrual status: IUD).          Sexually active: Yes.    The current method of family planning is IUD.    Exercising: No.   Smoker:  no  Health Maintenance: Pap: 2019 [facility].  Had hx of abnormal pap smear and HPV 15 years ago.  Normalized after first pregnancy. History of abnormal Pap:  yes MMG: 2019 Normal. [facility]  TDaP:  2019  Screening  Labs:    reports that she has never smoked. She has never used smokeless tobacco. She reports current alcohol use. She reports that she does not use drugs.  Past Medical History:  Diagnosis Date  . Abnormal Pap smear of cervix   . Allergic rhinitis due to pollen   . Asthma, chronic 05/28/2014  . Chronic bronchitis (HCC)   . Depression   . Essential hypertension 05/28/2014  . Kidney stones   . Sinus problem    chronic    Past Surgical History:  Procedure Laterality Date  . CESAREAN SECTION     x2  . KNEE SURGERY    . NASAL SINUS SURGERY    . SEPTOPLASTY      Current Outpatient Medications  Medication Sig Dispense Refill  . albuterol (PROVENTIL HFA;VENTOLIN HFA) 108 (90 Base) MCG/ACT inhaler Inhale 2 puffs into the lungs every 6 (six) hours as needed.    . ALPRAZolam (XANAX) 0.25 MG tablet Take 0.25 mg by mouth. Take 1/2 tablet by mouth once daily as needed.    . ARNUITY ELLIPTA 100 MCG/ACT AEPB Inhale 1 puff into the lungs daily.    Marland Kitchen azelastine (ASTELIN) 0.1 % nasal spray Place into both nostrils 2 (two) times daily. Use in each nostril as directed    . benzonatate (TESSALON) 100 MG capsule Take 1 capsule by mouth daily.    Marland Kitchen  buPROPion (WELLBUTRIN XL) 300 MG 24 hr tablet Take 300 mg by mouth daily.      . cetirizine (ZYRTEC) 10 MG tablet Take 10 mg by mouth daily.      . clindamycin-benzoyl peroxide (BENZACLIN) gel     . Eszopiclone 3 MG TABS Take 3 mg by mouth at bedtime. Take immediately before bedtime    . fluticasone (FLONASE) 50 MCG/ACT nasal spray Place into both nostrils daily.    . hydrochlorothiazide (MICROZIDE) 12.5 MG capsule Take 1 capsule (12.5 mg total) by mouth daily. 90 capsule 1  . L-Lysine HCl 1000 MG TABS Take by mouth daily.    Marland Kitchen levofloxacin (LEVAQUIN) 500 MG tablet Take 1 tablet by mouth daily.    . nitrofurantoin, macrocrystal-monohydrate, (MACROBID) 100 MG capsule Take 1 capsule by mouth as directed.    . ondansetron (ZOFRAN) 4 MG tablet     .  Probiotic Product (PROBIOTIC-10 PO) Take by mouth daily.    . pseudoephedrine (SUDAFED) 30 MG tablet Take 30 mg by mouth every 8 (eight) hours as needed for congestion.    Marland Kitchen Spacer/Aero-Holding Rudean Curt Use as directed 1 each 1   No current facility-administered medications for this visit.     Family History  Problem Relation Age of Onset  . Hypertension Mother   . Hyperlipidemia Mother   . Rheum arthritis Mother   . Hypertension Father   . Hyperlipidemia Father   . Coronary artery disease Father   . Pancreatic cancer Maternal Grandfather     Review of Systems  All other systems reviewed and are negative.   Exam:   BP 118/80 (BP Location: Right Arm, Patient Position: Sitting, Cuff Size: Normal)   Pulse 100   Resp 18   Ht 5\' 6"  (1.676 m)   Wt 138 lb 12.8 oz (63 kg)   BMI 22.40 kg/m    Height: 5\' 6"  (167.6 cm)  Ht Readings from Last 3 Encounters:  07/05/18 5\' 6"  (1.676 m)  04/06/18 5\' 6"  (1.676 m)  12/28/16 5\' 6"  (1.676 m)    General appearance: alert, cooperative and appears stated age Head: Normocephalic, without obvious abnormality, atraumatic Lymph nodes:  No abnormal inguinal nodes palpated Neurologic: Grossly normal  Pelvic: External genitalia:  no lesions              Urethra:  normal appearing urethra with no masses, tenderness or lesions              Bartholins and Skenes: normal                 Vagina: normal appearing vagina with normal color and discharge, no lesions              Cervix: no lesions, IUD string not noted on exam today              Pap taken: No. Bimanual Exam:  Uterus:  normal size, contour, position, consistency, mobility, non-tender              Adnexa: normal adnexa and no mass, fullness, tenderness               Rectovaginal: Confirms               Anus:  normal sphincter tone, no lesions Normal pelvic floor exam today noted  Chaperone was present for exam.  A:  Dyspareunia H/O renal stones and recurrent UTIs Vaginal  burning Mirena IUD with non-visualization of string (pt states string is short  and cannot be seen on exam)  P:   Release of records from [facility] will be signed today Vaginitis testing obtained today. Start vaginal premarin 1/2 gram pv twice weekly. Plan IUD removal and replacement in OR and see if can coordinate cystoscopy with Dr. Vernie Ammonsttelin IC diet given and she is going to review to see if there are some foods she can eliminate to see if this is contributing We did discuss endometriosis as well and the evaluation including laparoscopy.  This would be the last part of the evaluation as it is the most invasive.  She is comfortable with this.  ~45 minutes spent with patient >50% of time was in face to face discussion of above.

## 2018-07-06 ENCOUNTER — Telehealth: Payer: Self-pay | Admitting: *Deleted

## 2018-07-06 ENCOUNTER — Other Ambulatory Visit: Payer: Self-pay | Admitting: Obstetrics & Gynecology

## 2018-07-06 MED ORDER — ESTRADIOL 0.1 MG/GM VA CREA: TOPICAL_CREAM | VAGINAL | 1 refills | Status: DC

## 2018-07-06 NOTE — Telephone Encounter (Signed)
Return call to patient.  Premarin cream not covered.  Generic Estrace covered.  Requests change to Estrace.  Please advise for Estrace order directions.   Thank you

## 2018-07-06 NOTE — Telephone Encounter (Signed)
Pt notified of rx change via voicemail encounter.  Detailed message okay per designated party release form

## 2018-07-06 NOTE — Telephone Encounter (Signed)
Patient would like to speak with nurse about changing a prescription.

## 2018-07-06 NOTE — Telephone Encounter (Signed)
Rx for estrace sent to pharmacy on file.  Ok to close encounter.

## 2018-07-07 LAB — NUSWAB BV AND CANDIDA, NAA
CANDIDA ALBICANS, NAA: NEGATIVE
Candida glabrata, NAA: NEGATIVE

## 2018-07-24 ENCOUNTER — Encounter: Payer: Self-pay | Admitting: Neurology

## 2018-07-26 ENCOUNTER — Other Ambulatory Visit: Payer: Self-pay | Admitting: Neurology

## 2018-07-26 ENCOUNTER — Ambulatory Visit (INDEPENDENT_AMBULATORY_CARE_PROVIDER_SITE_OTHER): Payer: 59 | Admitting: Neurology

## 2018-07-26 DIAGNOSIS — M79642 Pain in left hand: Secondary | ICD-10-CM

## 2018-07-26 DIAGNOSIS — M79641 Pain in right hand: Secondary | ICD-10-CM

## 2018-07-26 NOTE — Procedures (Signed)
[facility]  [address], [address]  [place], [place] [address] Tel: [phone] Fax:  [phone] Test Date:  07/26/2018  Patient: Shelley Figueroa DOB: 1975-10-24 Physician: Nita Sickle, DO  Sex: Female Height: 5\' 6"  Ref Phys: Cindee Salt, MD  ID#: [id number] Temp: 37.0C Technician:    Patient Complaints: This is a 43 year-old female referred for evaluation of bilateral hand pain and left hand numbness/tingling.  NCV & EMG Findings: Extensive electrodiagnostic testing of the left upper extremity and additional studies of the right shows:  1. Bilateral median, ulnar, and mixed palmar sensory responses are within normal limits. 2. Bilateral median and ulnar motor responses are within normal limits. 3. There is no evidence of active or chronic motor axonal loss changes affecting any of the tested muscles.  Motor unit configuration and recruitment pattern is within normal limits.  Impression: This is a normal study of the upper extremities.    In particular, there is no evidence of carpal tunnel syndrome, ulnar neuropathy, or cervical radiculopathy.   ___________________________ Nita Sickle, DO    Nerve Conduction Studies Anti Sensory Summary Table   Stim Site NR Peak (ms) Norm Peak (ms) P-T Amp (V) Norm P-T Amp  Left Median Anti Sensory (2nd Digit)  37C  Wrist    2.3 <3.4 55.2 >20  Right Median Anti Sensory (2nd Digit)  37C  Wrist    2.5 <3.4 59.0 >20  Left Ulnar Anti Sensory (5th Digit)  37C  Wrist    2.4 <3.1 37.4 >12  Right Ulnar Anti Sensory (5th Digit)  37C  Wrist    2.6 <3.1 33.3 >12   Motor Summary Table   Stim Site NR Onset (ms) Norm Onset (ms) O-P Amp (mV) Norm O-P Amp Site1 Site2 Delta-0 (ms) Dist (cm) Vel (m/s) Norm Vel (m/s)  Left Median Motor (Abd Poll Brev)  37C  Wrist    2.3 <3.9 14.2 >6 Elbow Wrist 4.7 29.0 62 >50  Elbow    7.0  13.7         Right Median Motor (Abd Poll Brev)  37C  Wrist    2.5 <3.9 13.8 >6 Elbow Wrist 4.6  29.0 63 >50  Elbow    7.1  13.1         Left Ulnar Motor (Abd Dig Minimi)  37C  Wrist    1.6 <3.1 12.1 >7 B Elbow Wrist 3.9 24.0 62 >50  B Elbow    5.5  11.6  A Elbow B Elbow 1.5 10.0 67 >50  A Elbow    7.0  10.5         Right Ulnar Motor (Abd Dig Minimi)  37C  Wrist    2.0 <3.1 9.9 >7 B Elbow Wrist 3.8 22.0 58 >50  B Elbow    5.8  9.5  A Elbow B Elbow 1.5 10.0 67 >50  A Elbow    7.3  9.2          Comparison Summary Table   Stim Site NR Peak (ms) Norm Peak (ms) P-T Amp (V) Site1 Site2 Delta-P (ms) Norm Delta (ms)  Left Median/Ulnar Palm Comparison (Wrist - 8cm)  37C  Median Palm    1.4 <2.2 91.3 Median Palm Ulnar Palm 0.1   Ulnar Palm    1.3 <2.2 20.7      Right Median/Ulnar Palm Comparison (Wrist - 8cm)  37C  Median Palm    1.6 <2.2 72.4 Median Palm Ulnar Palm 0.3   Ulnar  Palm    1.3 <2.2 25.9       EMG   Side Muscle Ins Act Fibs Psw Fasc Number Recrt Dur Dur. Amp Amp. Poly Poly. Comment  Left 1stDorInt Nml Nml Nml Nml Nml Nml Nml Nml Nml Nml Nml Nml N/A  Left PronatorTeres Nml Nml Nml Nml Nml Nml Nml Nml Nml Nml Nml Nml N/A  Left Biceps Nml Nml Nml Nml Nml Nml Nml Nml Nml Nml Nml Nml N/A  Left Triceps Nml Nml Nml Nml Nml Nml Nml Nml Nml Nml Nml Nml N/A  Left Deltoid Nml Nml Nml Nml Nml Nml Nml Nml Nml Nml Nml Nml N/A  Right 1stDorInt Nml Nml Nml Nml Nml Nml Nml Nml Nml Nml Nml Nml N/A  Right PronatorTeres Nml Nml Nml Nml Nml Nml Nml Nml Nml Nml Nml Nml N/A  Right Biceps Nml Nml Nml Nml Nml Nml Nml Nml Nml Nml Nml Nml N/A  Right Triceps Nml Nml Nml Nml Nml Nml Nml Nml Nml Nml Nml Nml N/A  Right Deltoid Nml Nml Nml Nml Nml Nml Nml Nml Nml Nml Nml Nml N/A      Waveforms:

## 2018-07-26 NOTE — Progress Notes (Signed)
Note sent to Dr. Cindee Salt.

## 2018-08-02 ENCOUNTER — Encounter: Payer: 59 | Admitting: Neurology

## 2018-08-06 ENCOUNTER — Ambulatory Visit (INDEPENDENT_AMBULATORY_CARE_PROVIDER_SITE_OTHER): Payer: 59 | Admitting: Physician Assistant

## 2018-08-06 ENCOUNTER — Encounter: Payer: Self-pay | Admitting: Physician Assistant

## 2018-08-06 DIAGNOSIS — F411 Generalized anxiety disorder: Secondary | ICD-10-CM | POA: Diagnosis not present

## 2018-08-06 DIAGNOSIS — F331 Major depressive disorder, recurrent, moderate: Secondary | ICD-10-CM | POA: Diagnosis not present

## 2018-08-06 DIAGNOSIS — G4700 Insomnia, unspecified: Secondary | ICD-10-CM | POA: Diagnosis not present

## 2018-08-06 MED ORDER — ESZOPICLONE 3 MG PO TABS: 3.0000 mg | ORAL_TABLET | Freq: Every evening | ORAL | 1 refills | Status: DC | PRN

## 2018-08-06 MED ORDER — ALPRAZOLAM 0.25 MG PO TABS: 0.2500 mg | ORAL_TABLET | Freq: Two times a day (BID) | ORAL | 5 refills | Status: DC | PRN

## 2018-08-06 NOTE — Progress Notes (Signed)
[facility] Med Check  Patient ID: Shelley Figueroa,  MRN: [id number]  PCP: Doreene Nest, NP  Date of Evaluation: 08/06/2018 Time spent:15 minutes  Chief Complaint:  Chief Complaint    Follow-up      HISTORY/CURRENT STATUS: HPI Here for routine 6 month med check.   Work is very stressful.  And she and husband are doing some better.  He is getting intensive therapy which is helpful.  Sons are in busy with drama, cross-country, basketball, and she never has a break.  Fm is considering moving too.  "It's a lot."   Patient denies loss of interest in usual activities and is able to enjoy things.  Denies decreased energy or motivation.  Appetite has not changed.  No extreme sadness, tearfulness, or feelings of hopelessness.  Denies any changes in concentration, making decisions or remembering things.  Denies suicidal or homicidal thoughts.  She does have more anxiety at times.  Uses the Xanax about twice a week.  States if she did not have the [place], she probably would not sleep at all.  States she has a lot going on and is unable to get enough rest.  Things will get better patient states.  Denies muscle or joint pain, stiffness, or dystonia.  Denies dizziness, syncope, seizures, numbness, tingling, tremor, tics, unsteady gait, slurred speech, confusion.   Individual Medical History/ Review of Systems: Changes? :No    Past medications for mental health diagnoses include: Ambien, Lunesta, Wellbutrin, Xanax  Allergies: Penicillins; Amoxicillin; Codeine; Lisinopril; Doxycycline; Erythromycin; and Neomycin  Current Medications:  Current Outpatient Medications:  .  albuterol (PROVENTIL HFA;VENTOLIN HFA) 108 (90 Base) MCG/ACT inhaler, Inhale 2 puffs into the lungs every 6 (six) hours as needed., Disp: , Rfl:  .  ALPRAZolam (XANAX) 0.25 MG tablet, Take 1 tablet (0.25 mg total) by mouth 2 (two) times daily as needed for anxiety., Disp: 60 tablet, Rfl: 5 .  ARNUITY ELLIPTA 100  MCG/ACT AEPB, Inhale 1 puff into the lungs daily., Disp: , Rfl:  .  azelastine (ASTELIN) 0.1 % nasal spray, Place into both nostrils 2 (two) times daily. Use in each nostril as directed, Disp: , Rfl:  .  benzonatate (TESSALON) 100 MG capsule, Take 1 capsule by mouth daily., Disp: , Rfl:  .  buPROPion (WELLBUTRIN XL) 300 MG 24 hr tablet, Take 300 mg by mouth daily.  , Disp: , Rfl:  .  cetirizine (ZYRTEC) 10 MG tablet, Take 10 mg by mouth daily.  , Disp: , Rfl:  .  estradiol (ESTRACE) 0.1 MG/GM vaginal cream, 1 gram vaginally twice weekly, Disp: 42.5 g, Rfl: 1 .  Eszopiclone 3 MG TABS, Take 1 tablet (3 mg total) by mouth at bedtime as needed., Disp: 90 tablet, Rfl: 1 .  fluticasone (FLONASE) 50 MCG/ACT nasal spray, Place into both nostrils daily., Disp: , Rfl:  .  hydrochlorothiazide (MICROZIDE) 12.5 MG capsule, Take 1 capsule (12.5 mg total) by mouth daily., Disp: 90 capsule, Rfl: 1 .  L-Lysine HCl 1000 MG TABS, Take by mouth daily., Disp: , Rfl:  .  ondansetron (ZOFRAN) 4 MG tablet, , Disp: , Rfl:  .  Probiotic Product (PROBIOTIC-10 PO), Take by mouth daily., Disp: , Rfl:  .  pseudoephedrine (SUDAFED) 30 MG tablet, Take 30 mg by mouth every 8 (eight) hours as needed for congestion., Disp: , Rfl:  .  Spacer/Aero-Holding Chambers DEVI, Use as directed, Disp: 1 each, Rfl: 1 .  clindamycin-benzoyl peroxide (BENZACLIN) gel, , Disp: , Rfl:  .  levofloxacin (LEVAQUIN) 500 MG tablet, Take 1 tablet by mouth daily., Disp: , Rfl:  .  nitrofurantoin, macrocrystal-monohydrate, (MACROBID) 100 MG capsule, Take 1 capsule by mouth as directed., Disp: , Rfl:  Medication Side Effects: none  Family Medical/ Social History: Changes? No  MENTAL HEALTH EXAM:  There were no vitals taken for this visit.There is no height or weight on file to calculate BMI.  General Appearance: Casual and Well Groomed  Eye Contact:  Good  Speech:  Clear and Coherent  Volume:  Normal  Mood:  Euthymic  Affect:  Appropriate   Thought Process:  Goal Directed  Orientation:  Full (Time, Place, and Person)  Thought Content: Logical   Suicidal Thoughts:  No  Homicidal Thoughts:  No  Memory:  WNL  Judgement:  Good  Insight:  Good  Psychomotor Activity:  Normal  Concentration:  Concentration: Good  Recall:  Good  Fund of Knowledge: Good  Language: Good  Assets:  Desire for Improvement  ADL's:  Intact  Cognition: WNL  Prognosis:  Good    DIAGNOSES: No diagnosis found.  Receiving Psychotherapy: No    RECOMMENDATIONS: Continue Wellbutrin XL 300 mg p.o. daily. Continue Xanax 0.25 mg 1/2-1 twice daily as needed. Continue Lunesta 3 mg nightly as needed. Discussed sleep hygiene and taking time for herself every day. Return in 6 months or sooner as needed.   Melony Overly, PA-C   This record has been created using [employer].  Chart creation errors have been sought, but may not always have been located and corrected. Such creation errors do not reflect on the standard of medical care.

## 2018-09-17 ENCOUNTER — Telehealth: Payer: Self-pay

## 2018-09-17 ENCOUNTER — Telehealth: Payer: Self-pay | Admitting: Cardiovascular Disease

## 2018-09-17 NOTE — Telephone Encounter (Signed)
Spoke with patient and she is a self referral for HTN and family h/o heart disease. Blood pressure running in 140's and has headache. Did have stress echo several years ago. Will forward to Dr Duke Salvia to see if ok for virtual visit.

## 2018-09-17 NOTE — Telephone Encounter (Signed)
F/U Message        .      Patient is calling back concerning her appt. Pls call to advise.

## 2018-09-17 NOTE — Telephone Encounter (Signed)
LEFT VM FOR PT TO RETURN CALL TO MAKE APPT VIRTUAL. PREFERABLE DOXY.ME AT 9:30 AM

## 2018-09-17 NOTE — Telephone Encounter (Signed)
PT APPT NEEDS TO BE SET UP FOR 9:30 AM

## 2018-09-17 NOTE — Telephone Encounter (Signed)
sure

## 2018-09-18 ENCOUNTER — Encounter: Payer: Self-pay | Admitting: *Deleted

## 2018-09-18 NOTE — Telephone Encounter (Signed)
Spoke with patient and scheduled virtual visit for 09/20/18  Patient advised video visit insurance will be billed and best possible care will be given. Verbal consent given for visit

## 2018-09-19 ENCOUNTER — Telehealth: Payer: Self-pay | Admitting: Cardiovascular Disease

## 2018-09-19 NOTE — Telephone Encounter (Signed)
LVM for pre reg °

## 2018-09-19 NOTE — Telephone Encounter (Signed)
Smartphone/ my chart/ virtual consent/ pre reg completed °

## 2018-09-20 ENCOUNTER — Telehealth (INDEPENDENT_AMBULATORY_CARE_PROVIDER_SITE_OTHER): Payer: 59 | Admitting: Cardiovascular Disease

## 2018-09-20 ENCOUNTER — Encounter: Payer: Self-pay | Admitting: Cardiovascular Disease

## 2018-09-20 ENCOUNTER — Encounter: Payer: Self-pay | Admitting: *Deleted

## 2018-09-20 VITALS — BP 129/95 | HR 78 | Ht 66.0 in | Wt 141.0 lb

## 2018-09-20 DIAGNOSIS — Z5181 Encounter for therapeutic drug level monitoring: Secondary | ICD-10-CM

## 2018-09-20 DIAGNOSIS — Z8249 Family history of ischemic heart disease and other diseases of the circulatory system: Secondary | ICD-10-CM

## 2018-09-20 DIAGNOSIS — E785 Hyperlipidemia, unspecified: Secondary | ICD-10-CM | POA: Insufficient documentation

## 2018-09-20 DIAGNOSIS — E7800 Pure hypercholesterolemia, unspecified: Secondary | ICD-10-CM | POA: Diagnosis not present

## 2018-09-20 DIAGNOSIS — I10 Essential (primary) hypertension: Secondary | ICD-10-CM | POA: Diagnosis not present

## 2018-09-20 HISTORY — DX: Pure hypercholesterolemia, unspecified: E78.00

## 2018-09-20 MED ORDER — TRIAMTERENE-HCTZ 37.5-25 MG PO TABS: 1.0000 | ORAL_TABLET | Freq: Every day | ORAL | 1 refills | Status: DC

## 2018-09-20 NOTE — Progress Notes (Signed)
Virtual Visit via Video Note   This visit type was conducted due to national recommendations for restrictions regarding the COVID-19 Pandemic (e.g. social distancing) in an effort to limit this patient's exposure and mitigate transmission in our community.  Due to her co-morbid illnesses, this patient is at least at moderate risk for complications without adequate follow up.  This format is felt to be most appropriate for this patient at this time.  All issues noted in this document were discussed and addressed.  A limited physical exam was performed with this format.  Please refer to the patient's chart for her consent to telehealth for [facility].   Evaluation Performed:  Follow-up visit  Date:  09/20/2018   ID:  Shelley, Figueroa [birth year], MRN [id number]  Patient Location: Home Provider Location: Office  PCP:  Doreene Nest, NP  Cardiologist:  Chilton Si, MD  Electrophysiologist:  None   Chief Complaint: hypertension  History of Present Illness:    Shelley Figueroa is a 43 y.o. female with hypertension and chronic bronchitis here for hypertension and family history of heart disease.   Shelley Figueroa for the patient around 2016.  She initially tried lisinopril but developed a cough.  It initially was well-controlled on hydrochlorothiazide.  However the last 6 months she has noted that it is been higher.  When she checks it during the day it is in the 140s to 150s over 90s.  At the end of the day she has a headache.  She does have a stressful job but this has not changed.  Overall her diet is pretty good.  She has been gluten-free for the last 3 years.  She tries to limit her salt intake but does travel a lot for work.  She rarely uses Sudafed and has significantly limited her caffeine intake.  She does not get formal exercise but typically [place] a [place] airports.  Since she has been at home due to COVID-19 she has been trying to walk more.  She  walks approximately 3 to 4 days/week and has no exertional chest pain or shortness of breath. She denies lower extremity edema, orthopnea or PND.  Shelley Figueroa is also concerned about a family history of heart disease.  Both her parents smoked heavily.  Her father had a heart attack in his early 40s and multiple stents placed.  He later went on to have quadruple bypass surgery.  Shelley Figueroa had a stress echo in 2015 that was negative for ischemia.  Her symptoms were thought to be musculoskeletal.  The patient does not have symptoms concerning for COVID-19 infection (fever, chills, cough, or new shortness of breath).    Past Medical History:  Diagnosis Date  . Abnormal Pap smear of cervix   . Allergic rhinitis due to pollen   . Chronic bronchitis (HCC) 05/28/2014  . Depression   . Essential hypertension 05/28/2014  . Kidney stones   . Pure hypercholesterolemia 09/20/2018  . Sinus problem    chronic   Past Surgical History:  Procedure Laterality Date  . CESAREAN SECTION  2004, 2005  . KNEE ARTHROSCOPY Right 1995   with left knee realignment  . KNEE SURGERY Left 1991  . NASAL SINUS SURGERY  2015   wtih septoplasty     Current Meds  Medication Sig  . albuterol (PROVENTIL HFA;VENTOLIN HFA) 108 (90 Base) MCG/ACT inhaler Inhale 2 puffs into the lungs every 6 (six) hours as needed.  . ALPRAZolam Prudy Feeler)  0.25 MG tablet Take 1 tablet (0.25 mg total) by mouth 2 (two) times daily as needed for anxiety.  . ARNUITY ELLIPTA 100 MCG/ACT AEPB Inhale 1 puff into the lungs daily.  Marland Kitchen. azelastine (ASTELIN) 0.1 % nasal spray Place into both nostrils 2 (two) times daily. Use in each nostril as directed  . benzonatate (TESSALON) 100 MG capsule Take 1 capsule by mouth daily as needed.   Marland Kitchen. buPROPion (WELLBUTRIN XL) 300 MG 24 hr tablet Take 300 mg by mouth daily.    . cetirizine (ZYRTEC) 10 MG tablet Take 10 mg by mouth daily.    . clindamycin-benzoyl peroxide (BENZACLIN) gel   . D-MANNOSE PO Take by  mouth daily.  Marland Kitchen. estradiol (ESTRACE) 0.1 MG/GM vaginal cream 1 gram vaginally twice weekly  . Eszopiclone 3 MG TABS Take 1 tablet (3 mg total) by mouth at bedtime as needed.  . fluticasone (FLONASE) 50 MCG/ACT nasal spray Place into both nostrils daily.  Marland Kitchen. L-Lysine HCl 1000 MG TABS Take by mouth daily.  . Probiotic Product (PROBIOTIC-10 PO) Take by mouth daily.  . pseudoephedrine (SUDAFED) 30 MG tablet Take 30 mg by mouth every 8 (eight) hours as needed for congestion.  . [DISCONTINUED] hydrochlorothiazide (MICROZIDE) 12.5 MG capsule Take 1 capsule (12.5 mg total) by mouth daily.     Allergies:   Penicillins; Amoxicillin; Codeine; Lisinopril; Doxycycline; Erythromycin; and Neomycin   Social History   Tobacco Use  . Smoking status: Never Smoker  . Smokeless tobacco: Never Used  Substance Use Topics  . Alcohol use: Yes    Alcohol/week: 0.0 - 1.0 standard drinks    Comment: pervweek  . Drug use: No     Family Hx: The patient's family history includes Alcohol abuse in her sister; Alzheimer's disease in her paternal grandmother; Bipolar disorder in her sister; Coronary artery disease in her father; Depression in her sister; Hyperlipidemia in her father and mother; Hypertension in her father and mother; Obesity in her sister; Pancreatic cancer in her maternal grandfather; Parkinson's disease in her maternal grandmother; Rheum arthritis in her mother and sister.  ROS:   Please see the history of present illness.     All other systems reviewed and are negative.   Prior CV studies:   The following studies were reviewed today:  Stress echo 08/18/14: Study Conclusions  - Stress ECG conclusions: There were no stress arrhythmias or conduction abnormalities. The stress ECG was normal. - Staged echo: Left ventricular ejection fraction was normal at rest and with stress. Normal echo stress.  LVEF 60%.   Labs/Other Tests and Data Reviewed:    EKG:  An ECG dated 07/20/14 was personally  reviewed today and demonstrated:  sinus rhythm.  Rate 87 bpm  Recent Labs: 03/30/2018: ALT 21; BUN 21; Creatinine, Ser 0.99; Potassium 3.6; Sodium 139   Recent Lipid Panel Lab Results  Component Value Date/Time   CHOL 220 (H) 03/30/2018 08:04 AM   TRIG 99.0 03/30/2018 08:04 AM   HDL 54.00 03/30/2018 08:04 AM   CHOLHDL 4 03/30/2018 08:04 AM   LDLCALC 147 (H) 03/30/2018 08:04 AM    Wt Readings from Last 3 Encounters:  09/20/18 141 lb (64 kg)  07/05/18 138 lb 12.8 oz (63 kg)  04/06/18 139 lb 4 oz (63.2 kg)     Objective:    BP (!) 129/95 Comment: 30 minutes after medications  Pulse 78   Ht 5\' 6"  (1.676 m)   Wt 141 lb (64 kg)   BMI 22.76 kg/m  GENERAL:  Well-appearing.  No acute distress. HEENT: Pupils equal round.  Oral mucosa unremarkable NECK:  No jugular venous distention, no visible thyromegaly EXT:  No edema, no cyanosis no clubbing SKIN:  No rashes no nodules NEURO:  Speech fluent.  Cranial nerves grossly intact.  Moves all 4 extremities freely PSYCH:  Cognitively intact, oriented to person place and time   ASSESSMENT & PLAN:    # Hypertension: Study Conclusions  - Stress ECG conclusions: There were no stress arrhythmias or conduction abnormalities. The stress ECG was normal. - Staged echo: Left ventricular ejection fraction was normal at rest and with stress. Normal echo stress  # CV Risk Assessment: # FH Premature CAD: Ms. Crass has a family history of premature coronary artery disease.  Her father had significant CAD around age 47.  However he was a heavy smoker.  There is no other significant family history of CAD.  Her ASCVD ten-year risk is 1.3%.  However this does not take into account family history.  She is interested in getting a coronary calcium score once non-urgent testing is allowed.  For now continue to limit fried foods and fatty foods.  Increase exercise to 150 minutes weekly.  COVID-19 Education: The signs and symptoms of COVID-19 were  discussed with the patient and how to seek care for testing (follow up with PCP or arrange E-visit).  The importance of social distancing was discussed today.  Time:   Today, I have spent 25 minutes with the patient with telehealth technology discussing the above problems.     Medication Adjustments/Labs and Tests Ordered: Current medicines are reviewed at length with the patient today.  Concerns regarding medicines are outlined above.   Tests Ordered: Orders Placed This Encounter  Procedures  . CT CARDIAC SCORING  . Basic metabolic panel    Medication Changes: Meds ordered this encounter  Medications  . triamterene-hydrochlorothiazide (MAXZIDE-25) 37.5-25 MG tablet    Sig: Take 1 tablet by mouth daily.    Dispense:  90 tablet    Refill:  1    D/C HCTZ    Disposition:  Follow up in 1 month(s)  Signed, Chilton Si, MD  09/20/2018 9:25 AM    [facility]

## 2018-09-20 NOTE — Patient Instructions (Signed)
Medication Instructions:  STOP HYDROCHLOROTHIAZIDE   START HYDROCHLOROTHIAZIDE/TRIAMTERENE 25/37.5 MG DAILY  If you need a refill on your cardiac medications before your next appointment, please call your pharmacy.   Lab work: BMET IN 1 WEEK   If you have labs (blood work) drawn today and your tests are completely normal, you will receive your results only by: Marland Kitchen MyChart Message (if you have MyChart) OR . A paper copy in the mail If you have any lab test that is abnormal or we need to change your treatment, we will call you to review the results.  Testing/Procedures: CALCIUM SCORE THIS WILL COST $150 OUT OF POCKET [address] TO BE SCHEDULED AFTER COVID 19 RESTRICTIONS LIFTED   Follow-Up: 10/18/18 AT 9:00 A

## 2018-09-24 ENCOUNTER — Ambulatory Visit: Payer: 59 | Admitting: Cardiovascular Disease

## 2018-09-29 LAB — BASIC METABOLIC PANEL
BUN/Creatinine Ratio: 21 (ref 9–23)
BUN: 15 mg/dL (ref 6–24)
CO2: 25 mmol/L (ref 20–29)
Calcium: 9.3 mg/dL (ref 8.7–10.2)
Chloride: 100 mmol/L (ref 96–106)
Creatinine, Ser: 0.72 mg/dL (ref 0.57–1.00)
GFR calc Af Amer: 117 mL/min/{1.73_m2} (ref >59)
GFR calc non Af Amer: 101 mL/min/{1.73_m2} (ref >59)
Glucose: 87 mg/dL (ref 65–99)
Potassium: 3.8 mmol/L (ref 3.5–5.2)
Sodium: 141 mmol/L (ref 134–144)

## 2018-10-16 ENCOUNTER — Telehealth: Payer: Self-pay | Admitting: Adult Health

## 2018-10-16 ENCOUNTER — Telehealth: Payer: Self-pay | Admitting: Obstetrics & Gynecology

## 2018-10-16 NOTE — Telephone Encounter (Signed)
Patient is returning call to Sue Lush to schedule procedure.

## 2018-10-17 NOTE — Progress Notes (Signed)
Virtual Visit via Telephone Note   This visit type was conducted due to national recommendations for restrictions regarding the COVID-19 Pandemic (e.g. social distancing) in an effort to limit this patient's exposure and mitigate transmission in our community.  Due to her co-morbid illnesses, this patient is at least at moderate risk for complications without adequate follow up.  This format is felt to be most appropriate for this patient at this time.  The patient did not have access to video technology/had technical difficulties with video requiring transitioning to audio format only (telephone).  All issues noted in this document were discussed and addressed.  No physical exam could be performed with this format.  Please refer to the patient's chart for her  consent to telehealth for [facility].   Date:  10/17/2018   ID:  [name], DOB 1975/09/25, MRN [id number]  Patient Location: Home Provider Location: Home  PCP:  Pleas Koch, NP  Cardiologist:  Skeet Latch, MD  Electrophysiologist:  None   Evaluation Performed:  Follow-Up Visit  Chief Complaint: Dyspnea and hypertension  History of Present Illness:    Shelley Figueroa is a 43 y.o. female with known history of hypertension intolerant to ACE due to cough, and a strong FH of premature CAD.She had a normal stress echo. She is planned for a cardiac CTA when restrictions from pandemic are lifted.   She reports today that her blood pressure has gotten much better since she has eliminated salt and has been exercising a minimum of 30 minutes every day either by walking along with yoga or other activities.  She states that she feels better with her blood pressure lower and if she does eat something salty she can tell that her blood pressure is rising.  She has become very sensitive to this.  Of note, she believes that she and her family likely had the coronavirus in February 2020.  She states that it was without  fever but significant breathing issues, chills, malaise, coughing, lung congestion.she states it went through her family very quickly.  Since that time her breathing status has not fully recovered even though she is trying to continue to exercise.  She states that at first she thought she was just out of shape, but the breathing issues have not improved.  She denies chest pain rapid heart rhythm or dizziness.  She is still interested in having cardiac CT completed.  The patient does not have symptoms concerning for COVID-19 infection (fever, chills, cough, or new shortness of breath).  At this time but felt this occurred in February 2020.  She has not been tested for antibodies.   Past Medical History:  Diagnosis Date   Abnormal Pap smear of cervix    Allergic rhinitis due to pollen    Chronic bronchitis ([place]) 05/28/2014   Depression    Essential hypertension 05/28/2014   Kidney stones    Pure hypercholesterolemia 09/20/2018   Sinus problem    chronic   Past Surgical History:  Procedure Laterality Date   CESAREAN SECTION  2004, 2005   KNEE ARTHROSCOPY Right 1995   with left knee realignment   KNEE SURGERY Left 1991   NASAL SINUS SURGERY  2015   wtih septoplasty     No outpatient medications have been marked as taking for the 10/18/18 encounter (Appointment) with Lendon Colonel, NP.     Allergies:   Penicillins; Amoxicillin; Codeine; Lisinopril; Doxycycline; Erythromycin; and Neomycin   Social History   Tobacco  Use   Smoking status: Never Smoker   Smokeless tobacco: Never Used  Substance Use Topics   Alcohol use: Yes    Alcohol/week: 0.0 - 1.0 standard drinks    Comment: pervweek   Drug use: No     Family Hx: The patient's family history includes Alcohol abuse in her sister; Alzheimer's disease in her paternal grandmother; Bipolar disorder in her sister; Coronary artery disease in her father; Depression in her sister; Hyperlipidemia in her father and  mother; Hypertension in her father and mother; Obesity in her sister; Pancreatic cancer in her maternal grandfather; Parkinson's disease in her maternal grandmother; Rheum arthritis in her mother and sister.  ROS:   Please see the history of present illness.     All other systems reviewed and are negative.   Prior CV studies:   The following studies were reviewed today:  Stress Echo 08/18/2014 Stress ECG conclusions: There were no stress arrhythmias or conduction abnormalities. The stress ECG was normal. - Staged echo: Left ventricular ejection fraction was normal at rest and with stress. Normal echo stress  Labs/Other Tests and Data Reviewed:    EKG:  No ECG reviewed.  Recent Labs: 03/30/2018: ALT 21 09/28/2018: BUN 15; Creatinine, Ser 0.72; Potassium 3.8; Sodium 141   Recent Lipid Panel Lab Results  Component Value Date/Time   CHOL 220 (H) 03/30/2018 08:04 AM   TRIG 99.0 03/30/2018 08:04 AM   HDL 54.00 03/30/2018 08:04 AM   CHOLHDL 4 03/30/2018 08:04 AM   LDLCALC 147 (H) 03/30/2018 08:04 AM    Wt Readings from Last 3 Encounters:  09/20/18 141 lb (64 kg)  07/05/18 138 lb 12.8 oz (63 kg)  04/06/18 139 lb 4 oz (63.2 kg)     Objective:    Vital Signs:  There were no vitals taken for this visit.   VITAL SIGNS:  reviewed GEN:  no acute distress NEURO:  alert and oriented x 3, no obvious focal deficit PSYCH:  normal affect  ASSESSMENT & PLAN:    1 Chronic dyspnea on exertion: The patient has been exercising daily and doing yoga.  However she states that her breathing status is not improved after having severe respiratory illness in February 2020.  She continues to push through and exercise as this is helping her blood pressure.  I am going to repeat an echocardiogram to evaluate for any changes in her LV function which may be relating to her breathing status.  Consider having PFTs completed if echo is unremarkable.  2.  Strong family history of premature coronary  artery disease: She is planned to have cardiac CTA as soon as she can be scheduled after pandemic restrictions are lifted.  Heart rate is normally in the 80s and therefore she will be given metoprolol 25 mg to be taken prior to the CT.  Most recent labs were completed 09/28/2018 and therefore repeat be met may need to be completed.  Uncertain if with blood draw if she will require COVID testing especially with prior symptoms.  3.  Hypertension: Blood pressure has been much better controlled with salt reduction and use of Maxide.  She is severely limiting her salt intake as she states that she is become very sensitive to this and notices that her blood pressure does rise when she ingests salted foods.  COVID-19 Education: The signs and symptoms of COVID-19 were discussed with the patient and how to seek care for testing (follow up with PCP or arrange E-visit).  The importance of social  distancing was discussed today.  Time:   Today, I have spent 15 minutes with the patient with telehealth technology discussing the above problems.     Medication Adjustments/Labs and Tests Ordered: Current medicines are reviewed at length with the patient today.  Concerns regarding medicines are outlined above.   Tests Ordered: Echocardiogram, cardiac CT  Medication Changes: No orders of the defined types were placed in this encounter.   Disposition:  Follow up 6 weeks after echocardiogram and cardiac CT are completed.  Can be done with Dr. Oval Linsey or myself if Dr. Oval Linsey is not available.  Signed, Phill Myron. West Pugh, ANP, AACC  10/17/2018 8:21 AM    [facility]

## 2018-10-18 ENCOUNTER — Telehealth (INDEPENDENT_AMBULATORY_CARE_PROVIDER_SITE_OTHER): Payer: 59 | Admitting: Adult Health

## 2018-10-18 ENCOUNTER — Encounter: Payer: Self-pay | Admitting: Adult Health

## 2018-10-18 ENCOUNTER — Telehealth: Payer: 59 | Admitting: Cardiovascular Disease

## 2018-10-18 VITALS — BP 129/84 | HR 82 | Ht 66.0 in | Wt 140.0 lb

## 2018-10-18 DIAGNOSIS — R0600 Dyspnea, unspecified: Secondary | ICD-10-CM

## 2018-10-18 DIAGNOSIS — I10 Essential (primary) hypertension: Secondary | ICD-10-CM

## 2018-10-18 DIAGNOSIS — Z79899 Other long term (current) drug therapy: Secondary | ICD-10-CM

## 2018-10-18 DIAGNOSIS — Z8249 Family history of ischemic heart disease and other diseases of the circulatory system: Secondary | ICD-10-CM

## 2018-10-18 MED ORDER — METOPROLOL TARTRATE 25 MG PO TABS: 25.0000 mg | ORAL_TABLET | Freq: Once | ORAL | 0 refills | Status: DC

## 2018-10-18 NOTE — Patient Instructions (Addendum)
Medication Instructions:  NO CHANGES- Your physician recommends that you continue on your current medications as directed. Please refer to the Current Medication list given to you today. If you need a refill on your cardiac medications before your next appointment, please call your pharmacy.  Labwork: BMET BEFORE CT-HERE IN OUR OFFICE AT [facility]  You will NOT need to fast   Take the provided lab slips with you to the lab for your blood draw.   When you have your labs (blood work) drawn today and your tests are completely normal, you will receive your results only by MyChart Message (if you have MyChart) -OR-  A paper copy in the mail.  If you have any lab test that is abnormal or we need to change your treatment, we will call you to review these results.  Testing/Procedures: Echocardiogram (SOMEONE WILL BE CAlLING YOU TO SCHEDULE THIS APPOINTMENT) - Your physician has requested that you have an echocardiogram. Echocardiography is a painless test that uses sound waves to create images of your heart. It provides your doctor with information about the size and shape of your heart and how well your heart's chambers and valves are working. This procedure takes approximately one hour. There are no restrictions for this procedure. This will be performed at our [facility] location - [address].  CT Angiography (CTA)-(SOMEONE WILL BE CAlLING YOU TO SCHEDULE THIS APPOINTMENT) -  is a special type of CT scan that uses a computer to produce multi-dimensional views of major blood vessels throughout the body. In CT angiography, a contrast material is injected through an IV to help visualize the blood vessels If you need a refill on your cardiac medications before your next appointment, please call your pharmacy.  Follow-Up: You will need a follow up appointment in 6 months.  Please call our office 2 months in advance to schedule this appointment.  You may see Chilton Si, MD or Advanced  Practice Providers on your designated Care Team:  Corine Shelter, [place]  Judy Pimple, [place]  Marjie Skiff, [place]    At [facility], you and your health needs are our priority.  As part of our continuing mission to provide you with exceptional heart care, we have created designated Provider Care Teams.  These Care Teams include your primary Cardiologist (physician) and Advanced Practice Providers (APPs -  Physician Assistants and Nurse Practitioners) who all work together to provide you with the care you need, when you need it.  Thank you for choosing [facility] at [facility]!!

## 2018-10-18 NOTE — Telephone Encounter (Signed)
Spoke with patient regarding insurance.   Encounter closed.

## 2018-10-29 ENCOUNTER — Ambulatory Visit (INDEPENDENT_AMBULATORY_CARE_PROVIDER_SITE_OTHER): Payer: 59 | Admitting: Obstetrics & Gynecology

## 2018-10-29 ENCOUNTER — Telehealth: Payer: Self-pay | Admitting: *Deleted

## 2018-10-29 ENCOUNTER — Other Ambulatory Visit: Payer: Self-pay

## 2018-10-29 ENCOUNTER — Encounter: Payer: Self-pay | Admitting: Obstetrics & Gynecology

## 2018-10-29 VITALS — BP 120/86 | HR 80 | Temp 98.0°F | Ht 65.5 in | Wt 141.0 lb

## 2018-10-29 DIAGNOSIS — Z01419 Encounter for gynecological examination (general) (routine) without abnormal findings: Secondary | ICD-10-CM

## 2018-10-29 DIAGNOSIS — Z1211 Encounter for screening for malignant neoplasm of colon: Secondary | ICD-10-CM | POA: Diagnosis not present

## 2018-10-29 MED ORDER — ESTRADIOL 0.1 MG/GM VA CREA: TOPICAL_CREAM | VAGINAL | 4 refills | Status: DC

## 2018-10-29 NOTE — Progress Notes (Signed)
43 y.o. G50P1102 Married White or Caucasian female 1here for annual exam.  Needs IUD removed and replaced in OR and will plan cystoscopy at that same time.  Loves her IUD but does have significant with IUD placement.  Has been using vaginal estrogen cream with improvement.  Counseling has helped.    Moving into [place].  Son just got into [employer].  They are getting their house ready for the market.    Seeing Dr. Duke Salvia, cardiology, due to hypertension.  Is planning a stress test and coronary CT.  She reports she's having some issues with recovering from exercise.  She feels she had Covid in February.  No LMP recorded. (Menstrual status: IUD).          Sexually active: Yes.    The current method of family planning is IUD.  Mirena placed 02/2014 Exercising: Yes.    walk, yoga  Smoker:  no  Health Maintenance: Pap:  10/30/17 Neg. HR HPV:Neg   09/30/16 neg  History of abnormal Pap:  Yes, HR HPV+ years ago MMG:  10/30/17 BIRADS1:neg  Colonoscopy:  Never TDaP:  2019 Screening Labs: PCP   reports that she has never smoked. She has never used smokeless tobacco. She reports current alcohol use. She reports that she does not use drugs.  Past Medical History:  Diagnosis Date  . Abnormal Pap smear of cervix   . Allergic rhinitis due to pollen   . Chronic bronchitis (HCC) 05/28/2014  . Depression   . Essential hypertension 05/28/2014  . Kidney stones   . Pure hypercholesterolemia 09/20/2018  . Sinus problem    chronic    Past Surgical History:  Procedure Laterality Date  . CESAREAN SECTION  2004, 2005  . KNEE ARTHROSCOPY Right 1995   with left knee realignment  . KNEE SURGERY Left 1991  . NASAL SINUS SURGERY  2015   wtih septoplasty    Current Outpatient Medications  Medication Sig Dispense Refill  . albuterol (PROVENTIL HFA;VENTOLIN HFA) 108 (90 Base) MCG/ACT inhaler Inhale 2 puffs into the lungs every 6 (six) hours as needed.    . ALPRAZolam (XANAX) 0.25 MG tablet Take 1  tablet (0.25 mg total) by mouth 2 (two) times daily as needed for anxiety. 60 tablet 5  . ARNUITY ELLIPTA 100 MCG/ACT AEPB Inhale 1 puff into the lungs daily.    Marland Kitchen azelastine (ASTELIN) 0.1 % nasal spray Place into both nostrils 2 (two) times daily. Use in each nostril as directed    . buPROPion (WELLBUTRIN XL) 300 MG 24 hr tablet Take 300 mg by mouth daily.      . celecoxib (CELEBREX) 200 MG capsule Take 200 mg by mouth 2 (two) times daily.    . cetirizine (ZYRTEC) 10 MG tablet Take 10 mg by mouth daily.      . clindamycin-benzoyl peroxide (BENZACLIN) gel     . D-MANNOSE PO Take by mouth daily.    Marland Kitchen estradiol (ESTRACE) 0.1 MG/GM vaginal cream 1 gram vaginally twice weekly 42.5 g 1  . Eszopiclone 3 MG TABS Take 1 tablet (3 mg total) by mouth at bedtime as needed. 90 tablet 1  . fluticasone (FLONASE) 50 MCG/ACT nasal spray Place into both nostrils daily.    Marland Kitchen L-Lysine HCl 1000 MG TABS Take by mouth daily.    . Probiotic Product (PROBIOTIC-10 PO) Take by mouth daily.    Marland Kitchen Spacer/Aero-Holding Rudean Curt Use as directed 1 each 1  . triamterene-hydrochlorothiazide (MAXZIDE-25) 37.5-25 MG tablet Take 1 tablet  by mouth daily. 90 tablet 1  . valACYclovir (VALTREX) 500 MG tablet Take 1,000 mg by mouth 3 (three) times daily as needed.    . metoprolol tartrate (LOPRESSOR) 25 MG tablet Take 1 tablet (25 mg total) by mouth once for 1 dose. 1 tablet 0   No current facility-administered medications for this visit.     Family History  Problem Relation Age of Onset  . Hypertension Mother   . Hyperlipidemia Mother   . Rheum arthritis Mother   . Hypertension Father   . Hyperlipidemia Father   . Coronary artery disease Father        MI 64s, smoked, CABG, MULTIPLE STENTS  . Pancreatic cancer Maternal Grandfather   . Bipolar disorder Sister   . Alcohol abuse Sister   . Depression Sister   . Rheum arthritis Sister   . Obesity Sister   . Parkinson's disease Maternal Grandmother   . Alzheimer's disease  Paternal Grandmother     Review of Systems  All other systems reviewed and are negative.   Exam:   BP 120/86   Pulse 80   Temp 98 F (36.7 C) (Temporal)   Ht 5' 5.5" (1.664 m)   Wt 141 lb (64 kg)   BMI 23.11 kg/m   Height:   Height: 5' 5.5" (166.4 cm)  Ht Readings from Last 3 Encounters:  10/29/18 5' 5.5" (1.664 m)  10/18/18 5\' 6"  (1.676 m)  09/20/18 5\' 6"  (1.676 m)    General appearance: alert, cooperative and appears stated age Head: Normocephalic, without obvious abnormality, atraumatic Neck: no adenopathy, supple, symmetrical, trachea midline and thyroid normal to inspection and palpation Lungs: clear to auscultation bilaterally Breasts: normal appearance, no masses or tenderness Heart: regular rate and rhythm Abdomen: soft, non-tender; bowel sounds normal; no masses,  no organomegaly Extremities: extremities normal, atraumatic, no cyanosis or edema Skin: Skin color, texture, turgor normal. No rashes or lesions Lymph nodes: Cervical, supraclavicular, and axillary nodes normal. No abnormal inguinal nodes palpated Neurologic: Grossly normal   Pelvic: External genitalia:  no lesions              Urethra:  normal appearing urethra with no masses, tenderness or lesions              Bartholins and Skenes: normal                 Vagina: normal appearing vagina with normal color and discharge, no lesions              Cervix: no lesions              Pap taken: No. Bimanual Exam:  Uterus:  normal size, contour, position, consistency, mobility, non-tender              Adnexa: normal adnexa and no mass, fullness, tenderness               Rectovaginal: Confirms               Anus:  normal sphincter tone, no lesions  Chaperone was present for exam.  A:  Well Woman with normal exam Hypertension Depression/Anxiety Allergies H/O cesarean section x 2 Dyspareunia, improved with vaginal estrogen Mirena IUD due for removal 02/2019  P:   Mammogram guidelines reviewed.  She will  plan to do at some point this year IFOB given today.  ACS colon cancer screening guidelines reviewed pap smear with neg HR HPV 2019.  Not indicated today. Desires removal and replacement  of IUD in OR.  Will plan cystoscopy at same time. Return annually or prn

## 2018-10-29 NOTE — Telephone Encounter (Signed)
Call to patient. Left additional message regarding surgical date options.  Left message can call back or send My Cart message since after hours, or call office at 8 am.

## 2018-10-29 NOTE — Telephone Encounter (Signed)
Call to patient. Left message ( ok per DPR) regarding date options for surgery. Left message to call back.

## 2018-10-29 NOTE — Telephone Encounter (Signed)
Call from patient. Desires to proceed with surgery on 11-06-2018. Surgery instructions, including Covid 19 restrictions reviewed. Advised will call back once surgery confirmed.

## 2018-10-30 ENCOUNTER — Other Ambulatory Visit: Payer: Self-pay | Admitting: Obstetrics & Gynecology

## 2018-10-30 DIAGNOSIS — Z01818 Encounter for other preprocedural examination: Secondary | ICD-10-CM

## 2018-10-30 MED ORDER — LEVONORGESTREL 20 MCG/24HR IU IUD: 1.0000 | INTRAUTERINE_SYSTEM | Freq: Once | INTRAUTERINE | 0 refills | Status: DC

## 2018-10-30 MED FILL — MIRENA SYSTEM: 20 | 30 days supply | Qty: 1 | Fill #0

## 2018-10-30 NOTE — Telephone Encounter (Signed)
Call to patient.  Advised Rx for Mirena IUD sent to [facility]. Patient is aware she will pick up Rx and bring with her day of surgery.  Post-op OV rescheduled for 12/03/18 at 4:30pm.  Reviewed pre-op instructions, patient is aware copy of instructions will be placed in mail to address on file.   Patient was seen in office on 10/29/18 for AEX. Advised will review with Dr. Hyacinth Meeker and return call if any additional pre-op OV needed at this time. Patient verbalizes understanding and is agreeable.

## 2018-10-30 NOTE — Telephone Encounter (Signed)
Order placed for Mirena IUD to [facility].

## 2018-10-30 NOTE — Telephone Encounter (Signed)
Reviewed with Dr. Hyacinth Meeker, no additional pre-op visit needed.   Routing to Dr. Hyacinth Meeker. Encounter closed.

## 2018-11-01 ENCOUNTER — Encounter (HOSPITAL_BASED_OUTPATIENT_CLINIC_OR_DEPARTMENT_OTHER): Payer: Self-pay | Admitting: *Deleted

## 2018-11-01 ENCOUNTER — Other Ambulatory Visit: Payer: Self-pay

## 2018-11-01 NOTE — Progress Notes (Signed)
SPOKE W/  _ pt via phone     SCREENING SYMPTOMS OF COVID 19:   COUGH-- no  RUNNY NOSE---  no  SORE THROAT--- no  NASAL CONGESTION---- per pt due to allergies  SNEEZING---- no  SHORTNESS OF BREATH--- no  DIFFICULTY BREATHING--- no  TEMP >100.0 ----- no  UNEXPLAINED BODY ACHES------ no  CHILLS -------- no  HEADACHES --------- no  LOSS OF SMELL/ TASTE -------- no    HAVE YOU OR ANY FAMILY MEMBER TRAVELLED PAST 14 DAYS OUT OF THE   COUNTY--- no STATE---- no COUNTRY---- no  HAVE YOU OR ANY FAMILY MEMBER BEEN EXPOSED TO ANYONE WITH COVID 19?   denies  

## 2018-11-01 NOTE — Progress Notes (Signed)
Spoke w/ pt via phone for pre-op interview.  Npo after mn.  Arrive at 0830.  Needs urine preg.  Getting lab work (cbc, bmp, ekg) and covid test done Friday 11-02-2018 @ 1330.  Will take wellbutrin, do flonase nasal spray and arnuity ellipta inhaler am dos w/ sips of water.  Asked to bring rescue inhaler.

## 2018-11-02 ENCOUNTER — Other Ambulatory Visit (HOSPITAL_COMMUNITY)
Admission: RE | Admit: 2018-11-02 | Discharge: 2018-11-02 | Disposition: A | Discharge status: Home / Self Care | Payer: 59 | Source: Ambulatory Visit | Attending: Obstetrics & Gynecology | Admitting: Obstetrics & Gynecology

## 2018-11-02 ENCOUNTER — Encounter (HOSPITAL_COMMUNITY)
Admission: RE | Admit: 2018-11-02 | Discharge: 2018-11-02 | Disposition: A | Discharge status: Home / Self Care | Payer: 59 | Source: Ambulatory Visit | Attending: Obstetrics & Gynecology | Admitting: Obstetrics & Gynecology

## 2018-11-02 DIAGNOSIS — Z1159 Encounter for screening for other viral diseases: Secondary | ICD-10-CM | POA: Insufficient documentation

## 2018-11-02 DIAGNOSIS — Z01812 Encounter for preprocedural laboratory examination: Secondary | ICD-10-CM | POA: Insufficient documentation

## 2018-11-02 DIAGNOSIS — Z79890 Hormone replacement therapy (postmenopausal): Secondary | ICD-10-CM | POA: Diagnosis not present

## 2018-11-02 DIAGNOSIS — Z30433 Encounter for removal and reinsertion of intrauterine contraceptive device: Secondary | ICD-10-CM | POA: Diagnosis not present

## 2018-11-02 DIAGNOSIS — F419 Anxiety disorder, unspecified: Secondary | ICD-10-CM | POA: Diagnosis not present

## 2018-11-02 DIAGNOSIS — Z79899 Other long term (current) drug therapy: Secondary | ICD-10-CM | POA: Diagnosis not present

## 2018-11-02 DIAGNOSIS — Z87440 Personal history of urinary (tract) infections: Secondary | ICD-10-CM | POA: Diagnosis not present

## 2018-11-02 DIAGNOSIS — F329 Major depressive disorder, single episode, unspecified: Secondary | ICD-10-CM | POA: Diagnosis not present

## 2018-11-02 DIAGNOSIS — I10 Essential (primary) hypertension: Secondary | ICD-10-CM | POA: Diagnosis not present

## 2018-11-02 DIAGNOSIS — N941 Unspecified dyspareunia: Secondary | ICD-10-CM | POA: Diagnosis not present

## 2018-11-02 DIAGNOSIS — Z791 Long term (current) use of non-steroidal anti-inflammatories (NSAID): Secondary | ICD-10-CM | POA: Diagnosis not present

## 2018-11-02 DIAGNOSIS — J45909 Unspecified asthma, uncomplicated: Secondary | ICD-10-CM | POA: Diagnosis not present

## 2018-11-02 LAB — BASIC METABOLIC PANEL
Anion gap: 7 (ref 5–15)
BUN: 14 mg/dL (ref 6–20)
CO2: 28 mmol/L (ref 22–32)
Calcium: 8.9 mg/dL (ref 8.9–10.3)
Chloride: 103 mmol/L (ref 98–111)
Creatinine, Ser: 0.77 mg/dL (ref 0.44–1.00)
GFR calc Af Amer: >60 mL/min (ref >60)
GFR calc non Af Amer: >60 mL/min (ref >60)
Glucose, Bld: 109 mg/dL — ABNORMAL HIGH (ref 70–99)
Potassium: 3.5 mmol/L (ref 3.5–5.1)
Sodium: 138 mmol/L (ref 135–145)

## 2018-11-02 LAB — CBC
HCT: 38.7 % (ref 36.0–46.0)
Hemoglobin: 13 g/dL (ref 12.0–15.0)
MCH: 29.8 pg (ref 26.0–34.0)
MCHC: 33.6 g/dL (ref 30.0–36.0)
MCV: 88.8 fL (ref 80.0–100.0)
Platelets: 292 10*3/uL (ref 150–400)
RBC: 4.36 MIL/uL (ref 3.87–5.11)
RDW: 12.1 % (ref 11.5–15.5)
WBC: 5.7 10*3/uL (ref 4.0–10.5)
nRBC: 0 % (ref 0.0–0.2)

## 2018-11-03 LAB — NOVEL CORONAVIRUS, NAA (HOSP ORDER, SEND-OUT TO REF LAB; TAT 18-24 HRS): SARS-CoV-2, NAA: NOT DETECTED

## 2018-11-05 ENCOUNTER — Other Ambulatory Visit: Payer: Self-pay | Admitting: Obstetrics & Gynecology

## 2018-11-05 NOTE — Progress Notes (Signed)
SPOKE W/  Patient, Shelley Figueroa     SCREENING SYMPTOMS OF COVID 19:   COUGH--No  RUNNY NOSE--- No  SORE THROAT---No  NASAL CONGESTION----No  SNEEZING----No  SHORTNESS OF BREATH---No  DIFFICULTY BREATHING---No  TEMP >100.0 -----No  UNEXPLAINED BODY ACHES------No  CHILLS -------- No  HEADACHES ---------No   LOSS OF SMELL/ TASTE --------No     HAVE YOU OR ANY FAMILY MEMBER TRAVELLED PAST 14 DAYS OUT OF [place] STATE----No COUNTRY----No  HAVE YOU OR ANY FAMILY MEMBER BEEN EXPOSED TO ANYONE WITH COVID 19? No  Provided patient with negative COVID test results. Screening completed.  Lyndel Pleasure, RN

## 2018-11-05 NOTE — Progress Notes (Signed)
Called patient and left message to return call to complete pre-op COVID-19 screening.

## 2018-11-06 ENCOUNTER — Encounter (HOSPITAL_BASED_OUTPATIENT_CLINIC_OR_DEPARTMENT_OTHER): Payer: Self-pay | Admitting: Anesthesiology

## 2018-11-06 ENCOUNTER — Ambulatory Visit (HOSPITAL_BASED_OUTPATIENT_CLINIC_OR_DEPARTMENT_OTHER): Payer: 59 | Admitting: Certified Registered Nurse Anesthetist

## 2018-11-06 ENCOUNTER — Ambulatory Visit (HOSPITAL_BASED_OUTPATIENT_CLINIC_OR_DEPARTMENT_OTHER)
Admission: RE | Admit: 2018-11-06 | Discharge: 2018-11-06 | Disposition: A | Discharge status: Home / Self Care | Payer: 59 | Attending: Obstetrics & Gynecology | Admitting: Obstetrics & Gynecology

## 2018-11-06 ENCOUNTER — Ambulatory Visit (HOSPITAL_COMMUNITY): Payer: 59

## 2018-11-06 ENCOUNTER — Encounter (HOSPITAL_BASED_OUTPATIENT_CLINIC_OR_DEPARTMENT_OTHER)
Admission: RE | Disposition: A | Discharge status: Home / Self Care | Payer: Self-pay | Source: Home / Self Care | Attending: Obstetrics & Gynecology

## 2018-11-06 DIAGNOSIS — F329 Major depressive disorder, single episode, unspecified: Secondary | ICD-10-CM | POA: Insufficient documentation

## 2018-11-06 DIAGNOSIS — Z30433 Encounter for removal and reinsertion of intrauterine contraceptive device: Secondary | ICD-10-CM | POA: Insufficient documentation

## 2018-11-06 DIAGNOSIS — Z79890 Hormone replacement therapy (postmenopausal): Secondary | ICD-10-CM | POA: Insufficient documentation

## 2018-11-06 DIAGNOSIS — Z79899 Other long term (current) drug therapy: Secondary | ICD-10-CM | POA: Insufficient documentation

## 2018-11-06 DIAGNOSIS — I10 Essential (primary) hypertension: Secondary | ICD-10-CM | POA: Insufficient documentation

## 2018-11-06 DIAGNOSIS — Z791 Long term (current) use of non-steroidal anti-inflammatories (NSAID): Secondary | ICD-10-CM | POA: Insufficient documentation

## 2018-11-06 DIAGNOSIS — Z87440 Personal history of urinary (tract) infections: Secondary | ICD-10-CM | POA: Insufficient documentation

## 2018-11-06 DIAGNOSIS — F419 Anxiety disorder, unspecified: Secondary | ICD-10-CM | POA: Insufficient documentation

## 2018-11-06 DIAGNOSIS — J45909 Unspecified asthma, uncomplicated: Secondary | ICD-10-CM | POA: Insufficient documentation

## 2018-11-06 DIAGNOSIS — Z975 Presence of (intrauterine) contraceptive device: Secondary | ICD-10-CM

## 2018-11-06 DIAGNOSIS — N941 Unspecified dyspareunia: Secondary | ICD-10-CM | POA: Insufficient documentation

## 2018-11-06 HISTORY — PX: IUD REMOVAL: SHX5392

## 2018-11-06 HISTORY — PX: INTRAUTERINE DEVICE (IUD) INSERTION: SHX5877

## 2018-11-06 HISTORY — DX: Personal history of urinary calculi: Z87.442

## 2018-11-06 HISTORY — DX: Calculus of kidney: N20.0

## 2018-11-06 HISTORY — DX: Personal history of urinary (tract) infections: Z87.440

## 2018-11-06 HISTORY — DX: Other chronic cystitis without hematuria: N30.20

## 2018-11-06 HISTORY — DX: Personal history of other infectious and parasitic diseases: Z86.19

## 2018-11-06 HISTORY — PX: CYSTOSCOPY: SHX5120

## 2018-11-06 HISTORY — DX: Unspecified dyspareunia: N94.10

## 2018-11-06 HISTORY — DX: Dyspnea, unspecified: R06.00

## 2018-11-06 HISTORY — DX: Personal history of other diseases of the respiratory system: Z87.09

## 2018-11-06 HISTORY — DX: Other forms of dyspnea: R06.09

## 2018-11-06 LAB — POCT PREGNANCY, URINE: Preg Test, Ur: NEGATIVE

## 2018-11-06 SURGERY — REMOVAL, INTRAUTERINE DEVICE
Anesthesia: General | Site: Vagina

## 2018-11-06 MED ORDER — MIDAZOLAM HCL 2 MG/2ML IJ SOLN
INTRAMUSCULAR | Status: AC
  Filled 2018-11-06: qty 2

## 2018-11-06 MED ORDER — PROPOFOL 10 MG/ML IV BOLUS
INTRAVENOUS | Status: AC
  Filled 2018-11-06: qty 20

## 2018-11-06 MED ORDER — PROPOFOL 10 MG/ML IV BOLUS
INTRAVENOUS | Status: DC | PRN
  Administered 2018-11-06: 200 mg via INTRAVENOUS
  Administered 2018-11-06: 100 mg via INTRAVENOUS

## 2018-11-06 MED ORDER — ACETAMINOPHEN 325 MG PO TABS
325.0000 mg | ORAL_TABLET | Freq: Once | ORAL | Status: DC | PRN
  Filled 2018-11-06: qty 2

## 2018-11-06 MED ORDER — DEXAMETHASONE SODIUM PHOSPHATE 10 MG/ML IJ SOLN
INTRAMUSCULAR | Status: AC
  Filled 2018-11-06: qty 1

## 2018-11-06 MED ORDER — ACETAMINOPHEN 160 MG/5ML PO SOLN
325.0000 mg | Freq: Once | ORAL | Status: DC | PRN
  Filled 2018-11-06: qty 20.3

## 2018-11-06 MED ORDER — NON FORMULARY
Status: DC | PRN
  Administered 2018-11-06: 52 mg

## 2018-11-06 MED ORDER — KETOROLAC TROMETHAMINE 30 MG/ML IJ SOLN
INTRAMUSCULAR | Status: DC | PRN
  Administered 2018-11-06: 30 mg via INTRAVENOUS

## 2018-11-06 MED ORDER — LACTATED RINGERS IV SOLN
INTRAVENOUS | Status: DC
  Filled 2018-11-06: qty 1000

## 2018-11-06 MED ORDER — ONDANSETRON HCL 4 MG/2ML IJ SOLN
INTRAMUSCULAR | Status: AC
  Filled 2018-11-06: qty 2

## 2018-11-06 MED ORDER — PROMETHAZINE HCL 25 MG/ML IJ SOLN
6.2500 mg | INTRAMUSCULAR | Status: DC | PRN
  Filled 2018-11-06: qty 1

## 2018-11-06 MED ORDER — MEPERIDINE HCL 25 MG/ML IJ SOLN
6.2500 mg | INTRAMUSCULAR | Status: DC | PRN
  Filled 2018-11-06: qty 1

## 2018-11-06 MED ORDER — LACTATED RINGERS IV SOLN
INTRAVENOUS | Status: DC
  Administered 2018-11-06 (×2): via INTRAVENOUS
  Filled 2018-11-06: qty 1000

## 2018-11-06 MED ORDER — SCOPOLAMINE 1 MG/3DAYS TD PT72
MEDICATED_PATCH | TRANSDERMAL | Status: AC
  Filled 2018-11-06: qty 1

## 2018-11-06 MED ORDER — OXYCODONE HCL 5 MG/5ML PO SOLN
5.0000 mg | Freq: Once | ORAL | Status: DC | PRN
  Filled 2018-11-06: qty 5

## 2018-11-06 MED ORDER — KETOROLAC TROMETHAMINE 30 MG/ML IJ SOLN
INTRAMUSCULAR | Status: AC
  Filled 2018-11-06: qty 1

## 2018-11-06 MED ORDER — SCOPOLAMINE 1 MG/3DAYS TD PT72
MEDICATED_PATCH | TRANSDERMAL | Status: DC | PRN
  Administered 2018-11-06: 1 via TRANSDERMAL

## 2018-11-06 MED ORDER — FENTANYL CITRATE (PF) 100 MCG/2ML IJ SOLN
25.0000 ug | INTRAMUSCULAR | Status: DC | PRN
  Filled 2018-11-06: qty 1

## 2018-11-06 MED ORDER — LIDOCAINE HCL (CARDIAC) PF 100 MG/5ML IV SOSY
PREFILLED_SYRINGE | INTRAVENOUS | Status: DC | PRN
  Administered 2018-11-06: 40 mg via INTRAVENOUS

## 2018-11-06 MED ORDER — FENTANYL CITRATE (PF) 100 MCG/2ML IJ SOLN
INTRAMUSCULAR | Status: DC | PRN
  Administered 2018-11-06 (×2): 50 ug via INTRAVENOUS

## 2018-11-06 MED ORDER — MIDAZOLAM HCL 5 MG/5ML IJ SOLN
INTRAMUSCULAR | Status: DC | PRN
  Administered 2018-11-06: 2 mg via INTRAVENOUS

## 2018-11-06 MED ORDER — ACETAMINOPHEN 10 MG/ML IV SOLN
1000.0000 mg | Freq: Once | INTRAVENOUS | Status: DC | PRN
  Filled 2018-11-06: qty 100

## 2018-11-06 MED ORDER — DEXAMETHASONE SODIUM PHOSPHATE 10 MG/ML IJ SOLN
INTRAMUSCULAR | Status: DC | PRN
  Administered 2018-11-06: 10 mg via INTRAVENOUS

## 2018-11-06 MED ORDER — OXYCODONE HCL 5 MG PO TABS
5.0000 mg | ORAL_TABLET | Freq: Once | ORAL | Status: DC | PRN
  Filled 2018-11-06: qty 1

## 2018-11-06 MED ORDER — FENTANYL CITRATE (PF) 100 MCG/2ML IJ SOLN
INTRAMUSCULAR | Status: AC
  Filled 2018-11-06: qty 2

## 2018-11-06 MED ORDER — ONDANSETRON HCL 4 MG/2ML IJ SOLN
INTRAMUSCULAR | Status: DC | PRN
  Administered 2018-11-06: 4 mg via INTRAVENOUS

## 2018-11-06 MED ORDER — SODIUM CHLORIDE 0.9 % IR SOLN
Status: DC | PRN
  Administered 2018-11-06: 1000 mL

## 2018-11-06 MED ORDER — LIDOCAINE HCL 1 % IJ SOLN
INTRAMUSCULAR | Status: DC | PRN
  Administered 2018-11-06: 10 mL

## 2018-11-06 SURGICAL SUPPLY — 16 items
CATH ROBINSON RED A/P 16FR (CATHETERS) ×3 IMPLANT
GLOVE BIO SURGEON STRL SZ 6.5 (GLOVE) ×3 IMPLANT
GLOVE BIOGEL PI IND STRL 7.0 (GLOVE) ×6 IMPLANT
GLOVE BIOGEL PI INDICATOR 7.0 (GLOVE) ×3
GLOVE INDICATOR 6.5 STRL GRN (GLOVE) ×3 IMPLANT
GOWN STRL REUS W/ TWL LRG LVL3 (GOWN DISPOSABLE) ×4 IMPLANT
GOWN STRL REUS W/TWL LRG LVL3 (GOWN DISPOSABLE) ×6
HIBICLENS CHG 4% 4OZ BTL (MISCELLANEOUS) ×3 IMPLANT
MIRENA IUD ×1 IMPLANT
NDL SPNL 22GX3.5 QUINCKE BK (NEEDLE) ×2 IMPLANT
NEEDLE SPNL 22GX3.5 QUINCKE BK (NEEDLE) ×3 IMPLANT
PACK VAGINAL MINOR WOMEN LF (CUSTOM PROCEDURE TRAY) ×3 IMPLANT
PAD PREP 24X48 CUFFED NSTRL (MISCELLANEOUS) ×3 IMPLANT
SET IRRIG Y TYPE TUR BLADDER L (SET/KITS/TRAYS/PACK) ×3 IMPLANT
TOWEL OR 17X24 6PK STRL BLUE (TOWEL DISPOSABLE) ×6 IMPLANT
WATER STERILE IRR 3000ML UROMA (IV SOLUTION) ×3 IMPLANT

## 2018-11-06 NOTE — H&P (Signed)
Shelley HarrisonHeather R Figueroa is an 43 y.o. female  G2P2 MWF with hx of difficult IUD placement here for this in the OR.  Will remove IUD that is in placed and replace again with a Mirena IUD.  As she has experienced pelvic pain as well, have discussed cystoscopy at that same time to look for evidence of interstitial cystitis.  My office did communicate with Dr. Vernie Ammonsttelin about doing this combined but he declined.  She is aware that I will not do any hydrodistention if IC appears to be present but will at least be able to help with the diagnosis.    Risks, benefits have been reviewed.  There is no alternative to removal and replacement except in the office with oral sedation but she has declined doing the procedure in that way.    Pertinent Gynecological History: Menses: amenorrhea with Mirena IUD Bleeding: none Contraception: IUD DES exposure: denies Blood transfusions: none Sexually transmitted diseases: no past history Previous GYN Procedures: Mirena placement  Last mammogram: normal Date: 10/30/17 Last pap: normal Date: 10/30/17 OB History: G2, P2   Menstrual History: No LMP recorded. (Menstrual status: IUD).    Past Medical History:  Diagnosis Date  . Allergic rhinitis due to pollen   . Chronic cystitis   . Depression   . Dyspareunia in female   . Dyspnea on exertion    per pt hard to recover after exercise (cardiologist has echo and cardiac CT ordered when schedule is opened up)  . Essential hypertension    cardioloigst-  dr t. Duke Salviarandolph-- FH premature CAD--- 08-18-2014 normal stress echo  . History of chronic bronchitis   . History of HPV infection    remote  . History of kidney stones   . History of recurrent UTIs   . Pure hypercholesterolemia 09/20/2018  . Renal calculus, left    per pt non-obstructive    Past Surgical History:  Procedure Laterality Date  . CESAREAN SECTION  2004, 2005  . KNEE ARTHROSCOPY Right 1995   with left knee realignment  . KNEE SURGERY Left 1991  . NASAL  SINUS SURGERY  2015   wtih septoplasty  . TONSILLECTOMY  1987    Family History  Problem Relation Age of Onset  . Hypertension Mother   . Hyperlipidemia Mother   . Rheum arthritis Mother   . Hypertension Father   . Hyperlipidemia Father   . Coronary artery disease Father        MI 7650s, smoked, CABG, MULTIPLE STENTS  . Colon cancer Maternal Grandfather   . Bipolar disorder Sister   . Alcohol abuse Sister   . Depression Sister   . Rheum arthritis Sister   . Obesity Sister   . Parkinson's disease Maternal Grandmother   . Alzheimer's disease Paternal Grandmother     Social History:  reports that she has never smoked. She has never used smokeless tobacco. She reports current alcohol use. She reports that she does not use drugs.  Allergies:  Allergies  Allergen Reactions  . Penicillins Hives and Shortness Of Breath  . Amoxicillin Rash  . Codeine Itching  . Lisinopril Other (See Comments)    cough  . Doxycycline Nausea And Vomiting    Not a true allergy  . Erythromycin Nausea And Vomiting  . Neomycin Nausea And Vomiting    Medications Prior to Admission  Medication Sig Dispense Refill Last Dose  . ALPRAZolam (XANAX) 0.25 MG tablet Take 1 tablet (0.25 mg total) by mouth 2 (two) times daily as  needed for anxiety. 60 tablet 5 Past Week at Unknown time  . ARNUITY ELLIPTA 100 MCG/ACT AEPB Inhale 1 puff into the lungs daily.    11/06/2018 at Unknown time  . azelastine (ASTELIN) 0.1 % nasal spray Place into both nostrils 2 (two) times daily as needed. Use in each nostril as directed    11/06/2018 at Unknown time  . buPROPion (WELLBUTRIN XL) 300 MG 24 hr tablet Take 300 mg by mouth daily.    11/06/2018 at Unknown time  . celecoxib (CELEBREX) 200 MG capsule Take 200 mg by mouth daily.    11/02/2018 at Unknown time  . cetirizine (ZYRTEC) 10 MG tablet Take 10 mg by mouth at bedtime.    11/05/2018 at Unknown time  . clindamycin-benzoyl peroxide (BENZACLIN) gel    11/05/2018 at Unknown time  .  D-MANNOSE PO Take 3 capsules by mouth daily. For urinary symptoms   Past Week at Unknown time  . estradiol (ESTRACE) 0.1 MG/GM vaginal cream 1 gram vaginally twice weekly 42.5 g 4 Past Week at Unknown time  . Eszopiclone 3 MG TABS Take 1 tablet (3 mg total) by mouth at bedtime as needed. 90 tablet 1 11/05/2018 at Unknown time  . fluticasone (FLONASE) 50 MCG/ACT nasal spray Place into both nostrils daily.    11/06/2018 at Unknown time  . L-Lysine HCl 1000 MG TABS Take by mouth as needed.    Past Week at Unknown time  . Probiotic Product (PROBIOTIC-10 PO) Take by mouth daily.   Past Week at Unknown time  . triamterene-hydrochlorothiazide (MAXZIDE-25) 37.5-25 MG tablet Take 1 tablet by mouth daily. (Patient taking differently: Take 1 tablet by mouth daily. ) 90 tablet 1 11/05/2018 at Unknown time  . albuterol (PROVENTIL HFA;VENTOLIN HFA) 108 (90 Base) MCG/ACT inhaler Inhale 2 puffs into the lungs every 6 (six) hours as needed.   Unknown at Unknown time  . levonorgestrel (MIRENA, 52 MG,) 20 MCG/24HR IUD 1 Intra Uterine Device (1 each total) by Intrauterine route once for 1 dose. 1 each 0   . metoprolol tartrate (LOPRESSOR) 25 MG tablet Take 1 tablet (25 mg total) by mouth once for 1 dose. (Patient taking differently: Take 25 mg by mouth once. This is a one time dose to take prior to cardiac CT when it gets scheduled) 1 tablet 0   . Spacer/Aero-Holding Dorise Bullion Use as directed 1 each 1 Taking  . valACYclovir (VALTREX) 500 MG tablet Take 1,000 mg by mouth 3 (three) times daily as needed.   More than a month at Unknown time    Review of Systems  All other systems reviewed and are negative.   Blood pressure (!) 134/93, pulse 83, temperature 98.4 F (36.9 C), temperature source Oral, resp. rate 14, height 5\' 5"  (1.651 m), weight 63.3 kg, SpO2 99 %. Physical Exam  Constitutional: She is oriented to person, place, and time. She appears well-developed and well-nourished.  Cardiovascular: Normal rate and  regular rhythm.  Respiratory: Effort normal and breath sounds normal.  Neurological: She is alert and oriented to person, place, and time.  Skin: Skin is warm and dry.  Psychiatric: She has a normal mood and affect.    Results for orders placed or performed during the hospital encounter of 11/06/18 (from the past 24 hour(s))  Pregnancy, urine POC     Status: None   Collection Time: 11/06/18  9:20 AM  Result Value Ref Range   Preg Test, Ur NEGATIVE NEGATIVE    No results found.  Assessment/Plan: 43 yo G2P2 MWF her for mirena removal and replacement as well as cystoscopy due to prior difficult IUD placement and pelvic pain.  Questions answered.  Pt ready to proceed.    Jerene BearsMary S Kaegan Hettich 11/06/2018, 9:41 AM

## 2018-11-06 NOTE — Addendum Note (Signed)
Addendum  created 11/06/18 1412 by Suan Halter, CRNA   Charge Capture section accepted

## 2018-11-06 NOTE — Progress Notes (Signed)
SPOKE W/  _ Meira     SCREENING SYMPTOMS OF COVID 19:   COUGH-- no RUNNY NOSE--- no  SORE THROAT--- no  NASAL CONGESTION---- no  SNEEZING---- no  SHORTNESS OF BREATH--- no  DIFFICULTY BREATHING--- no  TEMP >100.0 ----- no  UNEXPLAINED BODY ACHES------ no  CHILLS -------- no  HEADACHES --------- no  LOSS OF SMELL/ TASTE -------- no    HAVE YOU OR ANY FAMILY MEMBER TRAVELLED PAST 14 DAYS OUT OF THE   COUNTY--- no STATE---- no COUNTRY---- no  HAVE YOU OR ANY FAMILY MEMBER BEEN EXPOSED TO ANYONE WITH COVID 19? no    

## 2018-11-06 NOTE — Transfer of Care (Signed)
Immediate Anesthesia Transfer of Care Note  Patient: Shelley Figueroa  Procedure(s) Performed: INTRAUTERINE DEVICE (IUD) REMOVAL (N/A Vagina ) INTRAUTERINE DEVICE (IUD) INSERTION WITH ULTRASOUND GUIDANCE (N/A Vagina ) CYSTOSCOPY (N/A Bladder)  Patient Location: PACU  Anesthesia Type:General  Level of Consciousness: awake, alert  and oriented  Airway & Oxygen Therapy: Patient Spontanous Breathing and Patient connected to nasal cannula oxygen  Post-op Assessment: Report given to RN, Post -op Vital signs reviewed and stable and Patient moving all extremities X 4  Post vital signs: Reviewed and stable  Last Vitals:  Vitals Value Taken Time  BP    Temp    Pulse 83 11/06/2018 11:04 AM  Resp    SpO2 100 % 11/06/2018 11:04 AM  Vitals shown include unvalidated device data.  Last Pain:  Vitals:   11/06/18 0841  TempSrc: Oral         Complications: No apparent anesthesia complications

## 2018-11-06 NOTE — Discharge Instructions (Addendum)
Post-surgical Instructions, Outpatient Surgery  You may expect to feel dizzy, weak, and drowsy for as long as 24 hours after receiving the medicine that made you sleep (anesthetic). For the first 24 hours after your surgery:    Do not drive a car, ride a bicycle, participate in physical activities, or take public transportation until you are done taking narcotic pain medicines or as directed by Dr. Sabra Heck.   Do not drink alcohol or take tranquilizers.   Do not take medicine that has not been prescribed by your physicians.   Do not sign important papers or make important decisions while on narcotic pain medicines.   Have a responsible person with you.   PAIN MANAGEMENT  Motrin 800mg .  (This is the same as 4-200mg  over the counter tablets of Motrin or ibuprofen.)  You may take this every eight hours or as needed for cramping.    You can alternate the Motrin with Tylenol 1gram every 6 hours as needed.  DO'S AND DON'T'S  Do not take a tub bath for two days.  You may shower on the first day after your surgery  Do move around as you feel able.  Stairs are fine.  You may begin to exercise again as you feel able.    Do not put anything in the vagina for two weeks--no tampons, intercourse, or douching.    REGULAR MEDIATIONS/VITAMINS:  You may restart all of your regular medications as prescribed.  You may restart all of your vitamins as you normally take them.    PLEASE CALL OR SEEK MEDICAL CARE IF:  You have persistent nausea and vomiting.   You have trouble eating or drinking.   You have an oral temperature above 100.5.   You have constipation that is not helped by adjusting diet or increasing fluid intake. Pain medicines are a common cause of constipation.   You have heavy vaginal bleeding  Post Anesthesia Home Care Instructions  Activity: Get plenty of rest for the remainder of the day. A responsible individual must stay with you for 24 hours following the procedure.  For  the next 24 hours, DO NOT: -Drive a car -Paediatric nurse -Drink alcoholic beverages -Take any medication unless instructed by your physician -Make any legal decisions or sign important papers.  Meals: Start with liquid foods such as gelatin or soup. Progress to regular foods as tolerated. Avoid greasy, spicy, heavy foods. If nausea and/or vomiting occur, drink only clear liquids until the nausea and/or vomiting subsides. Call your physician if vomiting continues.  Special Instructions/Symptoms: Your throat may feel dry or sore from the anesthesia or the breathing tube placed in your throat during surgery. If this causes discomfort, gargle with warm salt water. The discomfort should disappear within 24 hours.  If you had a scopolamine patch placed behind your ear for the management of post- operative nausea and/or vomiting:  1. The medication in the patch is effective for 72 hours, after which it should be removed.  Wrap patch in a tissue and discard in the trash. Wash hands thoroughly with soap and water. 2. You may remove the patch earlier than 72 hours if you experience unpleasant side effects which may include dry mouth, dizziness or visual disturbances. 3. Avoid touching the patch. Wash your hands with soap and water after contact with the patch.    May remove Scopolamine patch on Friday.  May take Aleve after 6:30 PM as needed for pain.

## 2018-11-06 NOTE — Anesthesia Procedure Notes (Signed)
Procedure Name: LMA Insertion Date/Time: 11/06/2018 10:19 AM Performed by: Effie Berkshire, MD Pre-anesthesia Checklist: Patient identified, Patient being monitored, Emergency Drugs available, Timeout performed and Suction available Patient Re-evaluated:Patient Re-evaluated prior to induction Oxygen Delivery Method: Circle System Utilized Preoxygenation: Pre-oxygenation with 100% oxygen Induction Type: IV induction Ventilation: Mask ventilation without difficulty LMA: LMA inserted LMA Size: 4.0 Number of attempts: 1 Placement Confirmation: positive ETCO2 and breath sounds checked- equal and bilateral

## 2018-11-06 NOTE — Anesthesia Postprocedure Evaluation (Signed)
Anesthesia Post Note  Patient: Shelley Figueroa  Procedure(s) Performed: INTRAUTERINE DEVICE (IUD) REMOVAL (N/A Vagina ) INTRAUTERINE DEVICE (IUD) INSERTION WITH ULTRASOUND GUIDANCE (N/A Vagina ) CYSTOSCOPY (N/A Bladder)     Patient location during evaluation: PACU Anesthesia Type: General Level of consciousness: awake and alert Pain management: pain level controlled Vital Signs Assessment: post-procedure vital signs reviewed and stable Respiratory status: spontaneous breathing, nonlabored ventilation, respiratory function stable and patient connected to nasal cannula oxygen Cardiovascular status: blood pressure returned to baseline and stable Postop Assessment: no apparent nausea or vomiting Anesthetic complications: no    Last Vitals:  Vitals:   11/06/18 1200 11/06/18 1215  BP:  131/79  Pulse:  81  Resp: 16 16  Temp:    SpO2:  98%    Last Pain:  Vitals:   11/06/18 1200  TempSrc:   PainSc: 0-No pain                 Effie Berkshire

## 2018-11-06 NOTE — Op Note (Addendum)
11/06/2018  11:13 AM  PATIENT:  Shelley Figueroa  43 y.o. female  PRE-OPERATIVE DIAGNOSIS:  Dyspareunia, contraception, prior difficult IUD placement  POST-OPERATIVE DIAGNOSIS:  Dyspareunia, contraception, prior difficult IUD placement  PROCEDURE:  Procedure(s): INTRAUTERINE DEVICE (IUD) REMOVAL INTRAUTERINE DEVICE (IUD) INSERTION WITH ULTRASOUND GUIDANCE CYSTOSCOPY  SURGEON:  Megan Salon  ASSISTANTS: OR staff   ANESTHESIA:   general  ESTIMATED BLOOD LOSS: 20 mL  BLOOD ADMINISTERED:none   FLUIDS: 1000ccLR  UOP: pt voided right before going back to the OR  SPECIMEN:  none  DISPOSITION OF SPECIMEN:  N/A  FINDINGS: normal pelvic exam  DESCRIPTION OF OPERATION: Patient was taken to the operating room.  She is placed in the supine position. SCDs were on her lower extremities and functioning properly. General anesthesia with an LMA was administered without difficulty. Dr. Smith Robert, anesthesia, oversaw case.  Legs were then placed in the [place] in the low lithotomy position. The legs were lifted to the high lithotomy position and the Betadine prep was used on the inner thighs perineum and vagina x3. Patient was draped in a normal standard fashion.  The anterior lip of the cervix was grasped with single-tooth tenaculum.  A paracervical block of 1% lidocaine mixed one-to-one with epinephrine (1:100,000 units).  10 cc was used total.  The IUD string could not be visualized but usigng the IUD hook, the string was easily removed from the cervix.  The IUD was grasped with a single toothed tenaculum and removed with one pull.  The cervix is dilated up to #21 [facility] dilators. The endometrial cavity sounded to 9 cm.  Then the new Mirena IUD and introducer were passed to the fundus and withdrawn slightly.  The IUD was then released into the endometrial cavity.  The introducer was removed.  There is a curve in the cervical canal and due to this, ultrasound was requested for correct  placement.  Tenaculum was removed from the anterior lip of the cervix and the speculum removed.    Then a cystoscope was passed through the urethra and to into the bladder.  NS was used to fill the bladder.  Normal mucosa was noted.  No abnormal findings were noted.  Entire bladder was visualized.  About 200cc NS was left in the bladder and the cystoscope removed.  Ultrasound was available and used to confirm correct placement.  Then the cystoscopic fluid was removed from the bladder.  Lastly, the speculum was replaced and the strings were cut to 2 cm.  The speculum was removed from the vagina. The prep was cleansed of the patient's skin. The legs are positioned back in the supine position. Sponge, lap, needle, initially counts were correct x2. Patient was taken to recovery in stable condition.  Mirena IUD Lot:  [id number], Exp:  Jun 2022.  Label from package placed on progress note paper to be scanned into Epic.  COUNTS:  YES  PLAN OF CARE: Transfer to PACU

## 2018-11-06 NOTE — Anesthesia Preprocedure Evaluation (Addendum)
Anesthesia Evaluation  Patient identified by MRN, date of birth, ID band Patient awake    Reviewed: Allergy & Precautions, NPO status , Patient's Chart, lab work & pertinent test results  Airway Mallampati: I  TM Distance: >3 FB Neck ROM: Full    Dental  (+) Teeth Intact, Dental Advisory Given   Pulmonary asthma ,    breath sounds clear to auscultation       Cardiovascular hypertension,  Rhythm:Regular Rate:Normal     Neuro/Psych Anxiety Depression negative neurological ROS     GI/Hepatic Neg liver ROS, GERD  ,  Endo/Other  negative endocrine ROS  Renal/GU      Musculoskeletal negative musculoskeletal ROS (+)   Abdominal Normal abdominal exam  (+)   Peds  Hematology negative hematology ROS (+)   Anesthesia Other Findings   Reproductive/Obstetrics                            Lab Results  Component Value Date   WBC 5.7 11/02/2018   HGB 13.0 11/02/2018   HCT 38.7 11/02/2018   MCV 88.8 11/02/2018   PLT 292 11/02/2018     Anesthesia Physical Anesthesia Plan  ASA: II  Anesthesia Plan: General   Post-op Pain Management:    Induction: Intravenous  PONV Risk Score and Plan: 4 or greater and Ondansetron, Dexamethasone, Midazolam and Scopolamine patch - Pre-op  Airway Management Planned: LMA  Additional Equipment: None  Intra-op Plan:   Post-operative Plan: Extubation in OR  Informed Consent: I have reviewed the patients History and Physical, chart, labs and discussed the procedure including the risks, benefits and alternatives for the proposed anesthesia with the patient or authorized representative who has indicated his/her understanding and acceptance.     Dental advisory given  Plan Discussed with: CRNA  Anesthesia Plan Comments: (COVID-19 Labs  No results for input(s): DDIMER, FERRITIN, LDH, CRP in the last 72 hours.  Lab Results      Component                Value                Date                      [id number]              NOT DETECTED        11/02/2018            )       Anesthesia Quick Evaluation

## 2018-11-07 ENCOUNTER — Encounter (HOSPITAL_BASED_OUTPATIENT_CLINIC_OR_DEPARTMENT_OTHER): Payer: Self-pay | Admitting: Obstetrics & Gynecology

## 2018-11-23 ENCOUNTER — Ambulatory Visit: Payer: Self-pay | Admitting: Obstetrics & Gynecology

## 2018-12-03 ENCOUNTER — Ambulatory Visit: Payer: Self-pay | Admitting: Obstetrics & Gynecology

## 2018-12-04 ENCOUNTER — Telehealth: Payer: Self-pay | Admitting: *Deleted

## 2018-12-04 DIAGNOSIS — R0600 Dyspnea, unspecified: Secondary | ICD-10-CM

## 2018-12-04 DIAGNOSIS — Z8249 Family history of ischemic heart disease and other diseases of the circulatory system: Secondary | ICD-10-CM

## 2018-12-04 NOTE — Telephone Encounter (Signed)
Order for BMET placed per [place].  Order placed under Provider ordering her Figueroa, Shelley Flake.  Pt will come to [address] location for bmet draw on this Thursday 7/9.

## 2018-12-06 ENCOUNTER — Other Ambulatory Visit: Payer: 59

## 2018-12-07 ENCOUNTER — Encounter (HOSPITAL_COMMUNITY): Payer: Self-pay | Admitting: Adult Health

## 2018-12-07 ENCOUNTER — Other Ambulatory Visit: Payer: 59 | Admitting: *Deleted

## 2018-12-07 ENCOUNTER — Other Ambulatory Visit: Payer: Self-pay

## 2018-12-07 DIAGNOSIS — Z01818 Encounter for other preprocedural examination: Secondary | ICD-10-CM

## 2018-12-07 DIAGNOSIS — R0600 Dyspnea, unspecified: Secondary | ICD-10-CM

## 2018-12-07 NOTE — Addendum Note (Signed)
Addended by: Eulis Foster on: 12/07/2018 03:27 PM   Modules accepted: Orders

## 2018-12-08 LAB — BASIC METABOLIC PANEL
BUN/Creatinine Ratio: 12 (ref 9–23)
BUN: 14 mg/dL (ref 6–24)
CO2: 25 mmol/L (ref 20–29)
Calcium: 9.5 mg/dL (ref 8.7–10.2)
Chloride: 100 mmol/L (ref 96–106)
Creatinine, Ser: 1.14 mg/dL — ABNORMAL HIGH (ref 0.57–1.00)
GFR calc Af Amer: 67 mL/min/{1.73_m2} (ref >59)
GFR calc non Af Amer: 58 mL/min/{1.73_m2} — ABNORMAL LOW (ref >59)
Glucose: 95 mg/dL (ref 65–99)
Potassium: 3.7 mmol/L (ref 3.5–5.2)
Sodium: 142 mmol/L (ref 134–144)

## 2018-12-10 ENCOUNTER — Telehealth: Payer: Self-pay | Admitting: *Deleted

## 2018-12-10 DIAGNOSIS — Z79899 Other long term (current) drug therapy: Secondary | ICD-10-CM

## 2018-12-10 NOTE — Telephone Encounter (Signed)
BMP ordered and lab slip mailed to pt. 

## 2018-12-10 NOTE — Telephone Encounter (Signed)
-----   Message from Lendon Colonel, NP sent at 12/08/2018  7:01 PM EDT ----- Creatinine is slightly elevated. She is on triameterene HCTZ.  Will need to increase fluids. Repeat labs in 3 months or if she continues to have symptoms. Need a BP recording. Please have her come by for BP check or through her PCP for accuracy.

## 2018-12-18 ENCOUNTER — Other Ambulatory Visit: Payer: Self-pay

## 2018-12-20 ENCOUNTER — Encounter: Payer: Self-pay | Admitting: Obstetrics & Gynecology

## 2018-12-20 ENCOUNTER — Other Ambulatory Visit: Payer: Self-pay

## 2018-12-20 ENCOUNTER — Ambulatory Visit (INDEPENDENT_AMBULATORY_CARE_PROVIDER_SITE_OTHER): Payer: 59 | Admitting: Obstetrics & Gynecology

## 2018-12-20 VITALS — BP 138/98 | HR 96 | Temp 97.9°F | Ht 65.0 in | Wt 137.4 lb

## 2018-12-20 DIAGNOSIS — N941 Unspecified dyspareunia: Secondary | ICD-10-CM

## 2018-12-20 DIAGNOSIS — Z30431 Encounter for routine checking of intrauterine contraceptive device: Secondary | ICD-10-CM

## 2018-12-20 NOTE — Progress Notes (Signed)
GYNECOLOGY  VISIT  CC:   Follow up after IUD placement  HPI: 43 y.o. G77P1102 Married White or Caucasian female here for follow-up after IUD insertion.  Had a little bit of spotting after her IUD placement.  Bleeding has been very light.  Denies cramping except for first day after   Has a contract on her current house and a new house under contract.  She found the house in the [facility] area.  She is very excited about their new home.    Has been SA since IUD placed and is still being successful with this.  Can tell when isn't using vaginal estrogen as regularly.  D/w estring as possible solution to less frequent estrogen cream use (due to twice weekly dosing).  She does not want to make any changes at this time.  GYNECOLOGIC HISTORY: No LMP recorded. (Menstrual status: IUD). Contraception: Mirena IUD  Patient Active Problem List   Diagnosis Date Noted  . Pure hypercholesterolemia 09/20/2018  . MDD (major depressive disorder) 06/15/2018  . Insomnia 06/15/2018  . Anxiety 06/15/2018  . Hyperlipidemia 04/06/2018  . Preventative health care 12/28/2016  . Asthma, chronic 05/28/2014  . Essential hypertension 05/28/2014  . GERD 09/23/2009  . 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 Allergic rhinitis 07/06/2009    Past Medical History:  Diagnosis Date  . Allergic rhinitis due to pollen   . Chronic cystitis   . Depression   . Dyspareunia in female   . Dyspnea on exertion    per pt hard to recover after exercise (cardiologist has echo and cardiac CT ordered when schedule is opened up)  . Essential hypertension    cardioloigst-  dr t. Oval Linsey-- FH premature CAD--- 08-18-2014 normal stress echo  . History of chronic bronchitis   . History of HPV infection    remote  . History of kidney stones   . History of recurrent UTIs   . Pure hypercholesterolemia 09/20/2018  . Renal calculus, left    per pt non-obstructive    Past Surgical History:  Procedure  Laterality Date  . CESAREAN SECTION  2004, 2005  . CYSTOSCOPY N/A 11/06/2018   Procedure: CYSTOSCOPY;  Surgeon: Megan Salon, MD;  Location: [facility];  Service: Gynecology;  Laterality: N/A;  . INTRAUTERINE DEVICE (IUD) INSERTION N/A 11/06/2018   Procedure: INTRAUTERINE DEVICE (IUD) INSERTION WITH ULTRASOUND GUIDANCE;  Surgeon: Megan Salon, MD;  Location: [facility];  Service: Gynecology;  Laterality: N/A;  . IUD REMOVAL N/A 11/06/2018   Procedure: INTRAUTERINE DEVICE (IUD) REMOVAL;  Surgeon: Megan Salon, MD;  Location: [facility];  Service: Gynecology;  Laterality: N/A;  Mirena IUD removal and reinsertion with ultrasound guidance.  Marland Kitchen KNEE ARTHROSCOPY Right 1995   with left knee realignment  . KNEE SURGERY Left 1991  . NASAL SINUS SURGERY  2015   wtih septoplasty  . TONSILLECTOMY  1987    MEDS:   Current Outpatient Medications on File Prior to Visit  Medication Sig Dispense Refill  . albuterol (PROVENTIL HFA;VENTOLIN HFA) 108 (90 Base) MCG/ACT inhaler Inhale 2 puffs into the lungs every 6 (six) hours as needed.    . ALPRAZolam (XANAX) 0.25 MG tablet Take 1 tablet (0.25 mg total) by mouth 2 (two) times daily as needed for anxiety. 60 tablet 5  . ARNUITY ELLIPTA 100 MCG/ACT AEPB Inhale 1 puff into the lungs daily.     Marland Kitchen azelastine (ASTELIN) 0.1 % nasal spray Place  into both nostrils 2 (two) times daily as needed. Use in each nostril as directed     . buPROPion (WELLBUTRIN XL) 300 MG 24 hr tablet Take 300 mg by mouth daily.     . celecoxib (CELEBREX) 200 MG capsule Take 200 mg by mouth daily.     . cetirizine (ZYRTEC) 10 MG tablet Take 10 mg by mouth at bedtime.     . clindamycin-benzoyl peroxide (BENZACLIN) gel     . estradiol (ESTRACE) 0.1 MG/GM vaginal cream 1 gram vaginally twice weekly 42.5 g 4  . Eszopiclone 3 MG TABS Take 1 tablet (3 mg total) by mouth at bedtime as needed. 90 tablet 1  . fluticasone (FLONASE) 50 MCG/ACT nasal spray  Place into both nostrils daily.     Marland Kitchen. L-Lysine HCl 1000 MG TABS Take by mouth as needed.     . Probiotic Product (PROBIOTIC-10 PO) Take by mouth daily.    Marland Kitchen. Spacer/Aero-Holding Rudean Curthambers DEVI Use as directed 1 each 1  . triamterene-hydrochlorothiazide (MAXZIDE-25) 37.5-25 MG tablet Take 1 tablet by mouth daily. (Patient taking differently: Take 1 tablet by mouth daily. ) 90 tablet 1  . valACYclovir (VALTREX) 500 MG tablet Take 1,000 mg by mouth 3 (three) times daily as needed.    Marland Kitchen. levonorgestrel (MIRENA, 52 MG,) 20 MCG/24HR IUD 1 Intra Uterine Device (1 each total) by Intrauterine route once for 1 dose. (Patient taking differently: 1 each by Intrauterine route once. Placed 11/06/18) 1 each 0  . metoprolol tartrate (LOPRESSOR) 25 MG tablet Take 1 tablet (25 mg total) by mouth once for 1 dose. (Patient taking differently: Take 25 mg by mouth once. This is a one time dose to take prior to cardiac CT when it gets scheduled) 1 tablet 0   No current facility-administered medications on file prior to visit.     ALLERGIES: Clindamycin, Penicillins, Amoxicillin, Codeine, Lisinopril, Doxycycline, Erythromycin, and Neomycin  Family History  Problem Relation Age of Onset  . Hypertension Mother   . Hyperlipidemia Mother   . Rheum arthritis Mother   . Hypertension Father   . Hyperlipidemia Father   . Coronary artery disease Father        MI 3950s, smoked, CABG, MULTIPLE STENTS  . Colon cancer Maternal Grandfather   . Bipolar disorder Sister   . Alcohol abuse Sister   . Depression Sister   . Rheum arthritis Sister   . Obesity Sister   . Parkinson's disease Maternal Grandmother   . Alzheimer's disease Paternal Grandmother     SH:  Married, non smoker  Review of Systems  PHYSICAL EXAMINATION:    BP (!) 138/98   Pulse 96   Temp 97.9 F (36.6 C) (Temporal)   Ht 5\' 5"  (1.651 m)   Wt 137 lb 6.4 oz (62.3 kg)   BMI 22.86 kg/m     General appearance: alert, cooperative and appears stated  age Lymph:  no inguinal LAD noted  Pelvic: External genitalia:  no lesions              Urethra:  normal appearing urethra with no masses, tenderness or lesions              Bartholins and Skenes: normal                 Vagina: normal appearing vagina with normal color and discharge, no lesions              Cervix: no lesions, 2cm IUD string noted  Bimanual Exam:  Uterus:  normal size, contour, position, consistency, mobility, non-tender              Adnexa: no mass, fullness, tenderness  Chaperone was present for exam.  Assessment: H/o vaginal atrophic changes Mirena IUD   Plan: Continue vaginal estrogen cream 1gm pv twice weekly.   Follow up for AEX as scheduled   ~15 minutes spent with patient >50% of time was in face to face discussion of above.

## 2018-12-22 ENCOUNTER — Other Ambulatory Visit: Payer: Self-pay | Admitting: Physician Assistant

## 2019-01-04 ENCOUNTER — Other Ambulatory Visit: Payer: Self-pay | Admitting: *Deleted

## 2019-01-04 DIAGNOSIS — R0600 Dyspnea, unspecified: Secondary | ICD-10-CM

## 2019-01-04 DIAGNOSIS — I10 Essential (primary) hypertension: Secondary | ICD-10-CM

## 2019-01-04 DIAGNOSIS — Z8249 Family history of ischemic heart disease and other diseases of the circulatory system: Secondary | ICD-10-CM

## 2019-01-07 ENCOUNTER — Other Ambulatory Visit: Payer: 59

## 2019-01-07 ENCOUNTER — Inpatient Hospital Stay: Admission: RE | Admit: 2019-01-07 | Payer: 59 | Source: Ambulatory Visit

## 2019-01-24 ENCOUNTER — Ambulatory Visit (HOSPITAL_COMMUNITY): Payer: 59 | Attending: Cardiology

## 2019-01-24 ENCOUNTER — Other Ambulatory Visit: Payer: Self-pay

## 2019-01-24 ENCOUNTER — Telehealth (HOSPITAL_COMMUNITY): Payer: Self-pay | Admitting: Emergency Medicine

## 2019-01-24 DIAGNOSIS — R0600 Dyspnea, unspecified: Secondary | ICD-10-CM | POA: Diagnosis not present

## 2019-01-24 NOTE — Telephone Encounter (Signed)
Left message on voicemail with name and callback number Jennefer Kopp RN Navigator Cardiac Imaging [facility] [phone] Office [phone] Cell  

## 2019-01-25 ENCOUNTER — Ambulatory Visit (HOSPITAL_COMMUNITY)
Admission: RE | Admit: 2019-01-25 | Discharge: 2019-01-25 | Disposition: A | Discharge status: Home / Self Care | Payer: 59 | Source: Ambulatory Visit | Attending: Adult Health | Admitting: Adult Health

## 2019-01-25 ENCOUNTER — Other Ambulatory Visit (HOSPITAL_COMMUNITY): Payer: 59

## 2019-01-25 ENCOUNTER — Encounter (HOSPITAL_COMMUNITY): Payer: Self-pay

## 2019-01-25 DIAGNOSIS — I10 Essential (primary) hypertension: Secondary | ICD-10-CM | POA: Diagnosis not present

## 2019-01-25 DIAGNOSIS — Z006 Encounter for examination for normal comparison and control in clinical research program: Secondary | ICD-10-CM

## 2019-01-25 DIAGNOSIS — Z8249 Family history of ischemic heart disease and other diseases of the circulatory system: Secondary | ICD-10-CM | POA: Insufficient documentation

## 2019-01-25 DIAGNOSIS — R0600 Dyspnea, unspecified: Secondary | ICD-10-CM | POA: Diagnosis not present

## 2019-01-25 MED ORDER — IOHEXOL 350 MG/ML SOLN
80.0000 mL | Freq: Once | INTRAVENOUS | Status: AC | PRN
  Administered 2019-01-25: 80 mL via INTRAVENOUS

## 2019-01-25 MED ORDER — NITROGLYCERIN 0.4 MG SL SUBL
0.8000 mg | SUBLINGUAL_TABLET | Freq: Once | SUBLINGUAL | Status: AC
  Administered 2019-01-25: 0.8 mg via SUBLINGUAL
  Filled 2019-01-25: qty 25

## 2019-01-25 MED ORDER — NITROGLYCERIN 0.4 MG SL SUBL
SUBLINGUAL_TABLET | SUBLINGUAL | Status: AC
  Filled 2019-01-25: qty 2

## 2019-01-25 NOTE — Progress Notes (Signed)
Ct complete. Patient complains of headache. Offered patient snack and beverage.

## 2019-01-25 NOTE — Research (Signed)
Cadfem Informed Consent    Patient Name: Shelley Figueroa   Subject met inclusion and exclusion criteria.  The informed consent form, study requirements and expectations were reviewed with the subject and questions and concerns were addressed prior to the signing of the consent form.  The subject verbalized understanding of the trail requirements.  The subject agreed to participate in the CADFEM trial and signed the informed consent.  The informed consent was obtained prior to performance of any protocol-specific procedures for the subject.  A copy of the signed informed consent was given to the subject and a copy was placed in the subject's medical record.   [name]

## 2019-01-29 ENCOUNTER — Emergency Department (HOSPITAL_COMMUNITY): Payer: 59

## 2019-01-29 ENCOUNTER — Other Ambulatory Visit: Payer: Self-pay

## 2019-01-29 ENCOUNTER — Emergency Department (HOSPITAL_COMMUNITY)
Admission: EM | Admit: 2019-01-29 | Discharge: 2019-01-30 | Disposition: A | Discharge status: Home / Self Care | Payer: 59 | Attending: Emergency Medicine | Admitting: Emergency Medicine

## 2019-01-29 ENCOUNTER — Encounter (HOSPITAL_COMMUNITY): Payer: Self-pay

## 2019-01-29 DIAGNOSIS — Z5321 Procedure and treatment not carried out due to patient leaving prior to being seen by health care provider: Secondary | ICD-10-CM | POA: Diagnosis not present

## 2019-01-29 DIAGNOSIS — R0789 Other chest pain: Secondary | ICD-10-CM | POA: Diagnosis present

## 2019-01-29 MED ORDER — SODIUM CHLORIDE 0.9% FLUSH: 3.0000 mL | Freq: Once | INTRAVENOUS | Status: DC

## 2019-01-29 NOTE — ED Triage Notes (Addendum)
Pt presents with rash to anterior chest x4 days. Pt had a cardiac CT with contrast Friday morning. Pt reports her sister is allergic to contrast dye. She is also c/o chest tightness due to her anxiety. Pt reports taking benadryl with no relief. Rash noted to chest and abd

## 2019-01-30 LAB — I-STAT BETA HCG BLOOD, ED (MC, WL, AP ONLY): I-stat hCG, quantitative: <5 m[IU]/mL (ref <5)

## 2019-01-30 LAB — CBC
HCT: 39 % (ref 36.0–46.0)
Hemoglobin: 13.2 g/dL (ref 12.0–15.0)
MCH: 30.1 pg (ref 26.0–34.0)
MCHC: 33.8 g/dL (ref 30.0–36.0)
MCV: 88.8 fL (ref 80.0–100.0)
Platelets: 299 10*3/uL (ref 150–400)
RBC: 4.39 MIL/uL (ref 3.87–5.11)
RDW: 12.2 % (ref 11.5–15.5)
WBC: 7.2 10*3/uL (ref 4.0–10.5)
nRBC: 0 % (ref 0.0–0.2)

## 2019-01-30 LAB — BASIC METABOLIC PANEL
Anion gap: 10 (ref 5–15)
BUN: 13 mg/dL (ref 6–20)
CO2: 28 mmol/L (ref 22–32)
Calcium: 9.4 mg/dL (ref 8.9–10.3)
Chloride: 100 mmol/L (ref 98–111)
Creatinine, Ser: 0.93 mg/dL (ref 0.44–1.00)
GFR calc Af Amer: >60 mL/min (ref >60)
GFR calc non Af Amer: >60 mL/min (ref >60)
Glucose, Bld: 96 mg/dL (ref 70–99)
Potassium: 3.2 mmol/L — ABNORMAL LOW (ref 3.5–5.1)
Sodium: 138 mmol/L (ref 135–145)

## 2019-01-30 LAB — TROPONIN I (HIGH SENSITIVITY)
Troponin I (High Sensitivity): 3 ng/L (ref <18)
Troponin I (High Sensitivity): 3 ng/L (ref <18)

## 2019-01-30 NOTE — ED Notes (Signed)
Pt states that she is leaving due to wait times, pt states that she will take benadryl at home and see her allergist tomorrow.

## 2019-02-08 ENCOUNTER — Encounter: Payer: Self-pay | Admitting: Physician Assistant

## 2019-02-08 ENCOUNTER — Other Ambulatory Visit: Payer: Self-pay

## 2019-02-08 ENCOUNTER — Ambulatory Visit (INDEPENDENT_AMBULATORY_CARE_PROVIDER_SITE_OTHER): Payer: 59 | Admitting: Physician Assistant

## 2019-02-08 DIAGNOSIS — G4700 Insomnia, unspecified: Secondary | ICD-10-CM

## 2019-02-08 DIAGNOSIS — F411 Generalized anxiety disorder: Secondary | ICD-10-CM | POA: Diagnosis not present

## 2019-02-08 DIAGNOSIS — F331 Major depressive disorder, recurrent, moderate: Secondary | ICD-10-CM

## 2019-02-08 NOTE — Progress Notes (Signed)
[facility] Med Check  Patient ID: Shelley Figueroa,  MRN: [id number]  PCP: Pleas Koch, NP  Date of Evaluation: 02/08/2019 Time spent:15 minutes  Chief Complaint:  Chief Complaint    Follow-up     Virtual Visit via Telephone Note  I connected with patient by a video enabled telemedicine application or telephone, with their informed consent, and verified patient privacy and that I am speaking with the correct person using two identifiers.  I am private, in my home and the patient is home.  I discussed the limitations, risks, security and privacy concerns of performing an evaluation and management service by telephone and the availability of in person appointments. I also discussed with the patient that there may be a patient responsible charge related to this service. The patient expressed understanding and agreed to proceed.   I discussed the assessment and treatment plan with the patient. The patient was provided an opportunity to ask questions and all were answered. The patient agreed with the plan and demonstrated an understanding of the instructions.   The patient was advised to call back or seek an in-person evaluation if the symptoms worsen or if the condition fails to improve as anticipated.  I provided 15 minutes of non-face-to-face time during this encounter.  HISTORY/CURRENT STATUS: HPI For routine 6 month med check.  Feels like meds are working well.  Anxiety is controlled w/ Xanax. Sleeps good w/ the Lunesta.   Working from home now, which has been really different than traveling w/ her job. "We're having to re-evaluate our jobs and how this will play out."   Kids are fine. One is at [employer] and the other is doing online classes.  That is stressful too.  They sold their home and bought a new home in August. Have more room.   Patient denies loss of interest in usual activities and is able to enjoy things.  Denies decreased energy or motivation.  Appetite has  not changed.  No extreme sadness, tearfulness, or feelings of hopelessness.  Denies any changes in concentration, making decisions or remembering things.  Denies suicidal or homicidal thoughts.  Denies dizziness, syncope, seizures, numbness, tingling, tremor, tics, unsteady gait, slurred speech, confusion. Denies muscle or joint pain, stiffness, or dystonia.  Individual Medical History/ Review of Systems: Changes? :No    Past medications for mental health diagnoses include: Ambien, Lunesta, Wellbutrin, Xanax  Allergies: Clindamycin, Penicillins, Amoxicillin, Codeine, Lisinopril, Doxycycline, Erythromycin, and Neomycin  Current Medications:  Current Outpatient Medications:  .  albuterol (PROVENTIL HFA;VENTOLIN HFA) 108 (90 Base) MCG/ACT inhaler, Inhale 2 puffs into the lungs every 6 (six) hours as needed., Disp: , Rfl:  .  ALPRAZolam (XANAX) 0.25 MG tablet, Take 1 tablet (0.25 mg total) by mouth 2 (two) times daily as needed for anxiety., Disp: 60 tablet, Rfl: 5 .  ARNUITY ELLIPTA 100 MCG/ACT AEPB, Inhale 1 puff into the lungs daily. , Disp: , Rfl:  .  azelastine (ASTELIN) 0.1 % nasal spray, Place into both nostrils 2 (two) times daily as needed. Use in each nostril as directed , Disp: , Rfl:  .  buPROPion (WELLBUTRIN XL) 300 MG 24 hr tablet, TAKE 1 TABLET BY MOUTH EVERY MORNING, Disp: 90 tablet, Rfl: 1 .  celecoxib (CELEBREX) 200 MG capsule, Take 200 mg by mouth daily. , Disp: , Rfl:  .  cetirizine (ZYRTEC) 10 MG tablet, Take 10 mg by mouth at bedtime. , Disp: , Rfl:  .  clindamycin-benzoyl peroxide (BENZACLIN) gel, , Disp: , Rfl:  .  estradiol (ESTRACE) 0.1 MG/GM vaginal cream, 1 gram vaginally twice weekly, Disp: 42.5 g, Rfl: 4 .  Eszopiclone 3 MG TABS, Take 1 tablet (3 mg total) by mouth at bedtime as needed., Disp: 90 tablet, Rfl: 1 .  fluticasone (FLONASE) 50 MCG/ACT nasal spray, Place into both nostrils daily. , Disp: , Rfl:  .  L-Lysine HCl 1000 MG TABS, Take by mouth as needed. ,  Disp: , Rfl:  .  Probiotic Product (PROBIOTIC-10 PO), Take by mouth daily., Disp: , Rfl:  .  Spacer/Aero-Holding Chambers DEVI, Use as directed, Disp: 1 each, Rfl: 1 .  triamterene-hydrochlorothiazide (MAXZIDE-25) 37.5-25 MG tablet, Take 1 tablet by mouth daily. (Patient taking differently: Take 1 tablet by mouth daily. ), Disp: 90 tablet, Rfl: 1 .  valACYclovir (VALTREX) 500 MG tablet, Take 1,000 mg by mouth 3 (three) times daily as needed., Disp: , Rfl:  .  levonorgestrel (MIRENA, 52 MG,) 20 MCG/24HR IUD, 1 Intra Uterine Device (1 each total) by Intrauterine route once for 1 dose. (Patient taking differently: 1 each by Intrauterine route once. Placed 11/06/18), Disp: 1 each, Rfl: 0 .  metoprolol tartrate (LOPRESSOR) 25 MG tablet, Take 1 tablet (25 mg total) by mouth once for 1 dose. (Patient taking differently: Take 25 mg by mouth once. This is a one time dose to take prior to cardiac CT when it gets scheduled), Disp: 1 tablet, Rfl: 0 Medication Side Effects: none  Family Medical/ Social History: Changes? No  MENTAL HEALTH EXAM:  There were no vitals taken for this visit.There is no height or weight on file to calculate BMI.  General Appearance: unable to assess  Eye Contact:  unable to assess  Speech:  Clear and Coherent  Volume:  Normal  Mood:  Euthymic  Affect:  unable to assess  Thought Process:  Goal Directed  Orientation:  Full (Time, Place, and Person)  Thought Content: Logical   Suicidal Thoughts:  No  Homicidal Thoughts:  No  Memory:  WNL  Judgement:  Good  Insight:  Good  Psychomotor Activity:  unable to assess  Concentration:  Concentration: Good  Recall:  Good  Fund of Knowledge: Good  Language: Good  Assets:  Desire for Improvement  ADL's:  Intact  Cognition: WNL  Prognosis:  Good    DIAGNOSES:    ICD-10-CM   1. Major depressive disorder, recurrent episode, moderate (HCC)  F33.1   2. Generalized anxiety disorder  F41.1   3. Insomnia, unspecified type  G47.00      Receiving Psychotherapy: No    RECOMMENDATIONS:  Continue Wellbutrin XL 300 mg 1 p.o. daily. Continue Xanax 0.25 mg twice daily as needed. Continue Lunesta 3 mg nightly as needed. Return in 6 months.  Melony Overlyeresa , PA-C   This record has been created using [employer] software.  Chart creation errors have been sought, but may not always have been located and corrected. Such creation errors do not reflect on the standard of medical care.

## 2019-02-14 ENCOUNTER — Encounter: Payer: Self-pay | Admitting: *Deleted

## 2019-03-19 ENCOUNTER — Other Ambulatory Visit: Payer: Self-pay | Admitting: Cardiovascular Disease

## 2019-03-25 ENCOUNTER — Other Ambulatory Visit: Payer: Self-pay | Admitting: Obstetrics & Gynecology

## 2019-04-22 LAB — FECAL OCCULT BLOOD, IMMUNOCHEMICAL

## 2019-04-24 ENCOUNTER — Other Ambulatory Visit: Payer: Self-pay

## 2019-04-24 DIAGNOSIS — Z20822 Contact with and (suspected) exposure to covid-19: Secondary | ICD-10-CM

## 2019-04-25 LAB — NOVEL CORONAVIRUS, NAA: SARS-CoV-2, NAA: NOT DETECTED

## 2019-04-30 ENCOUNTER — Other Ambulatory Visit: Payer: Self-pay | Admitting: Physician Assistant

## 2019-06-16 ENCOUNTER — Other Ambulatory Visit: Payer: Self-pay | Admitting: Physician Assistant

## 2019-07-30 ENCOUNTER — Encounter: Payer: Self-pay | Admitting: Cardiology

## 2019-07-30 ENCOUNTER — Telehealth: Payer: Self-pay

## 2019-07-30 ENCOUNTER — Telehealth (INDEPENDENT_AMBULATORY_CARE_PROVIDER_SITE_OTHER): Payer: 59 | Admitting: Cardiology

## 2019-07-30 VITALS — BP 139/84 | HR 80 | Temp 98.2°F | Ht 66.0 in | Wt 140.0 lb

## 2019-07-30 DIAGNOSIS — I10 Essential (primary) hypertension: Secondary | ICD-10-CM

## 2019-07-30 DIAGNOSIS — E785 Hyperlipidemia, unspecified: Secondary | ICD-10-CM | POA: Diagnosis not present

## 2019-07-30 DIAGNOSIS — Z8249 Family history of ischemic heart disease and other diseases of the circulatory system: Secondary | ICD-10-CM

## 2019-07-30 DIAGNOSIS — Z91041 Radiographic dye allergy status: Secondary | ICD-10-CM | POA: Diagnosis not present

## 2019-07-30 MED ORDER — TRIAMTERENE-HCTZ 37.5-25 MG PO TABS: 1.0000 | ORAL_TABLET | Freq: Every day | ORAL | 3 refills | Status: DC

## 2019-07-30 NOTE — Progress Notes (Signed)
Virtual Visit via Video Note   This visit type was conducted due to national recommendations for restrictions regarding the COVID-19 Pandemic (e.g. social distancing) in an effort to limit this patient's exposure and mitigate transmission in our community.  Due to her co-morbid illnesses, this patient is at least at moderate risk for complications without adequate follow up.  This format is felt to be most appropriate for this patient at this time.  All issues noted in this document were discussed and addressed.  A limited physical exam was performed with this format.  Please refer to the patient's chart for her consent to telehealth for [facility].   Date:  07/30/2019   ID:  Shelley Figueroa, DOB Aug 01, 1975, MRN [id number]  Patient Location: Home Provider Location: Home  PCP:  Shelley Koch, NP  Cardiologist:  Shelley Latch, MD  Electrophysiologist:  None   Evaluation Performed:  Follow-Up Visit  Chief Complaint:  none  History of Present Illness:    Shelley Figueroa is a 44 y.o. female with a family history of coronary disease and hypertension followed by Shelley Figueroa.  Prior echocardiogram and coronary CT were normal.  Her calcium score is 0.  She did have an allergic reaction after her coronary CT which required a course of steroids.  The patient was contacted today for a video visit, due to technical difficulties it was changed to a phone visit.  She is done well since we saw her last. She is exercising and denies chest pain or SOB.  She no longer travels for work and says that she is eating better.  She is moved and no longer has a PCP and asked if we would refill her Maxide.  In reviewing her labs she did have an LDL of 147 back in 2019.  I suggested she go on a low-cholesterol diet and we reevaluate this in a few months.  I will arrange for a follow-up with Shelley Figueroa in 6 months.  The patient does not have symptoms concerning for COVID-19 infection (fever, chills,  cough, or new shortness of breath).    Past Medical History:  Diagnosis Date  . Allergic rhinitis due to pollen   . Chronic cystitis   . Depression   . Dyspareunia in female   . Dyspnea on exertion    per pt hard to recover after exercise (cardiologist has echo and cardiac CT ordered when schedule is opened up)  . Essential hypertension    cardioloigst-  dr t. Oval Figueroa-- FH premature CAD--- 08-18-2014 normal stress echo  . History of chronic bronchitis   . History of HPV infection    remote  . History of kidney stones   . History of recurrent UTIs   . Pure hypercholesterolemia 09/20/2018  . Renal calculus, left    per pt non-obstructive   Past Surgical History:  Procedure Laterality Date  . CESAREAN SECTION  2004, 2005  . CYSTOSCOPY N/A 11/06/2018   Procedure: CYSTOSCOPY;  Surgeon: Shelley Salon, MD;  Location: [facility];  Service: Gynecology;  Laterality: N/A;  . INTRAUTERINE DEVICE (IUD) INSERTION N/A 11/06/2018   Procedure: INTRAUTERINE DEVICE (IUD) INSERTION WITH ULTRASOUND GUIDANCE;  Surgeon: Shelley Salon, MD;  Location: [place];  Service: Gynecology;  Laterality: N/A;  . IUD REMOVAL N/A 11/06/2018   Procedure: INTRAUTERINE DEVICE (IUD) REMOVAL;  Surgeon: Shelley Salon, MD;  Location: [facility];  Service: Gynecology;  Laterality: N/A;  Mirena IUD removal and  reinsertion with ultrasound guidance.  Marland Kitchen KNEE ARTHROSCOPY Right 1995   with left knee realignment  . KNEE SURGERY Left 1991  . NASAL SINUS SURGERY  2015   wtih septoplasty  . TONSILLECTOMY  1987     Current Meds  Medication Sig  . albuterol (PROVENTIL HFA;VENTOLIN HFA) 108 (90 Base) MCG/ACT inhaler Inhale 2 puffs into the lungs every 6 (six) hours as needed.  . ALPRAZolam (XANAX) 0.25 MG tablet Take 1 tablet (0.25 mg total) by mouth 2 (two) times daily as needed for anxiety.  . ARNUITY ELLIPTA 100 MCG/ACT AEPB Inhale 1 puff into the lungs daily.   Marland Kitchen azelastine  (ASTELIN) 0.1 % nasal spray Place into both nostrils 2 (two) times daily as needed. Use in each nostril as directed   . buPROPion (WELLBUTRIN XL) 300 MG 24 hr tablet TAKE 1 TABLET BY MOUTH EVERY MORNING  . cetirizine (ZYRTEC) 10 MG tablet Take 10 mg by mouth at bedtime.   . clindamycin-benzoyl peroxide (BENZACLIN) gel   . estradiol (ESTRACE) 0.1 MG/GM vaginal cream 1 gram vaginally twice weekly  . Eszopiclone 3 MG TABS TAKE 1 TABLET (3 MG TOTAL) BY MOUTH AT BEDTIME AS NEEDED.  . fluticasone (FLONASE) 50 MCG/ACT nasal spray Place into both nostrils daily.   Marland Kitchen L-Lysine HCl 1000 MG TABS Take by mouth as needed.   . Probiotic Product (PROBIOTIC-10 PO) Take by mouth daily.  Marland Kitchen Spacer/Aero-Holding [facility] Use as directed  . triamterene-hydrochlorothiazide (MAXZIDE-25) 37.5-25 MG tablet TAKE 1 TABLET BY MOUTH EVERY DAY  . valACYclovir (VALTREX) 500 MG tablet Take 1,000 mg by mouth 3 (three) times daily as needed.     Allergies:   Clindamycin, Penicillins, Amoxicillin, Codeine, Contrast media [iodinated diagnostic agents], Lisinopril, Doxycycline, Erythromycin, Iohexol, and Neomycin   Social History   Tobacco Use  . Smoking status: Never Smoker  . Smokeless tobacco: Never Used  Substance Use Topics  . Alcohol use: Yes    Alcohol/week: 0.0 - 1.0 standard drinks  . Drug use: Never     Family Hx: The patient's family history includes Alcohol abuse in her sister; Alzheimer's disease in her paternal grandmother; Bipolar disorder in her sister; Colon cancer in her maternal grandfather; Coronary artery disease in her father; Depression in her sister; Hyperlipidemia in her father and mother; Hypertension in her father and mother; Obesity in her sister; Parkinson's disease in her maternal grandmother; Rheum arthritis in her mother and sister.  ROS:   Please see the history of present illness.    All other systems reviewed and are negative.   Prior CV studies:   The following studies were  reviewed today: Echo Aug 2020 Coronary CTA Aug 2020  Labs/Other Tests and Data Reviewed:    EKG:  An ECG dated 01/30/2019 was personally reviewed today and demonstrated:  NSR  Recent Labs: 01/29/2019: BUN 13; Creatinine, Ser 0.93; Hemoglobin 13.2; Platelets 299; Potassium 3.2; Sodium 138   Recent Lipid Panel Lab Results  Component Value Date/Time   CHOL 220 (H) 03/30/2018 08:04 AM   TRIG 99.0 03/30/2018 08:04 AM   HDL 54.00 03/30/2018 08:04 AM   CHOLHDL 4 03/30/2018 08:04 AM   LDLCALC 147 (H) 03/30/2018 08:04 AM    Wt Readings from Last 3 Encounters:  07/30/19 140 lb (63.5 kg)  01/29/19 137 lb (62.1 kg)  12/20/18 137 lb 6.4 oz (62.3 kg)     Objective:    Vital Signs:  BP 139/84   Pulse 80   Temp 98.2 F (36.8  C)   Ht 5\' 6"  (1.676 m)   Wt 140 lb (63.5 kg)   BMI 22.60 kg/m    VITAL SIGNS:  reviewed  ASSESSMENT & PLAN:    H/O dyspnea with  Multiple cardiac risk factors- Coronary CTA and echo normal, Ca++ score 0-Aug 2020  Contrast allergy- Significant rash after CTA requiring steroids  HTN- Controlled on Maxzide  Dyslipidemia LDL 147 Nov 2019   FmHx of CAD- Father with CAD in his 74's  Plan: Low cholesterol diet, check fasting lipids and BMP in 3 months, Dr 59's in 6 months.   COVID-19 Education: The signs and symptoms of COVID-19 were discussed with the patient and how to seek care for testing (follow up with PCP or arrange E-visit).  The importance of social distancing was discussed today.  Time:   Today, I have spent 15 minutes with the patient with telehealth technology discussing the above problems.     Medication Adjustments/Labs and Tests Ordered: Current medicines are reviewed at length with the patient today.  Concerns regarding medicines are outlined above.   Tests Ordered: No orders of the defined types were placed in this encounter.   Medication Changes: No orders of the defined types were placed in this encounter.   Follow Up:   In Person 6 months Dr Duke Salvia  Signed, Duke Salvia, PA-C  07/30/2019 9:06 AM    [facility]

## 2019-07-30 NOTE — Telephone Encounter (Signed)
Called patient to discuss AVS instructions gave Luke Kilroy's recommendations and patient voiced understanding. AVS summary mailed to patient.    

## 2019-07-30 NOTE — Patient Instructions (Signed)
Medication Instructions:  Your physician recommends that you continue on your current medications as directed. Please refer to the Current Medication list given to you today.  *If you need a refill on your cardiac medications before your next appointment, please call your pharmacy*   Lab Work: Your physician recommends that you return for lab work in 3 months:  Fasting Lipid Panel-DO NOT EAT OR DRINK PAST MIDNIGHT (OK TO DRINK WATER)  BMET  If you have labs (blood work) drawn today and your tests are completely normal, you will receive your results only by: Marland Kitchen MyChart Message (if you have MyChart) OR . A paper copy in the mail If you have any lab test that is abnormal or we need to change your treatment, we will call you to review the results.   Testing/Procedures: NONE ordered at this time of appointment   Follow-Up: At [facility], you and your health needs are our priority.  As part of our continuing mission to provide you with exceptional heart care, we have created designated Provider Care Teams.  These Care Teams include your primary Cardiologist (physician) and Advanced Practice Providers (APPs -  Physician Assistants and Nurse Practitioners) who all work together to provide you with the care you need, when you need it.  We recommend signing up for the patient portal called "MyChart".  Sign up information is provided on this After Visit Summary.  MyChart is used to connect with patients for Virtual Visits (Telemedicine).  Patients are able to view lab/test results, encounter notes, upcoming appointments, etc.  Non-urgent messages can be sent to your provider as well.   To learn more about what you can do with MyChart, go to [email or web address].    Your next appointment:   6 month(s)  The format for your next appointment:   In Person  Provider:   Chilton Si, MD  Other Instructions

## 2019-10-24 ENCOUNTER — Other Ambulatory Visit: Payer: Self-pay | Admitting: Physician Assistant

## 2019-10-25 NOTE — Telephone Encounter (Signed)
Last apt 09/11 was due back in March. Nothing scheduled

## 2019-11-06 ENCOUNTER — Other Ambulatory Visit: Payer: Self-pay

## 2019-11-06 DIAGNOSIS — E785 Hyperlipidemia, unspecified: Secondary | ICD-10-CM

## 2019-11-06 DIAGNOSIS — I10 Essential (primary) hypertension: Secondary | ICD-10-CM

## 2019-11-06 LAB — BASIC METABOLIC PANEL
BUN/Creatinine Ratio: 19 (ref 9–23)
BUN: 16 mg/dL (ref 6–24)
CO2: 27 mmol/L (ref 20–29)
Calcium: 9 mg/dL (ref 8.7–10.2)
Chloride: 99 mmol/L (ref 96–106)
Creatinine, Ser: 0.86 mg/dL (ref 0.57–1.00)
GFR calc Af Amer: 94 mL/min/{1.73_m2} (ref >59)
GFR calc non Af Amer: 81 mL/min/{1.73_m2} (ref >59)
Glucose: 93 mg/dL (ref 65–99)
Potassium: 3.9 mmol/L (ref 3.5–5.2)
Sodium: 140 mmol/L (ref 134–144)

## 2019-11-06 LAB — LIPID PANEL
Chol/HDL Ratio: 3.2 ratio (ref 0.0–4.4)
Cholesterol, Total: 224 mg/dL — ABNORMAL HIGH (ref 100–199)
HDL: 70 mg/dL (ref >39)
LDL Chol Calc (NIH): 138 mg/dL — ABNORMAL HIGH (ref 0–99)
Triglycerides: 90 mg/dL (ref 0–149)
VLDL Cholesterol Cal: 16 mg/dL (ref 5–40)

## 2019-11-14 ENCOUNTER — Ambulatory Visit: Payer: Self-pay | Admitting: Physician Assistant

## 2019-11-27 NOTE — Progress Notes (Signed)
44 y.o. G17P1102 Married White or Caucasian female here for annual exam.  Doing well.  Had IUD placed 11/06/2018.  Cycles are still present and not as heavy.  With prior IUD, did not bleed at all.  Although bleeding isn't heavy, it is a difference from the past.    Not needing vaginal estrogen cream.    Loves new home.    No LMP recorded. (Menstrual status: IUD).          Sexually active: Yes.    The current method of family planning is IUD.   Mirena placed 11-06-2018 Exercising: Yes.    some walking Smoker:  no  Health Maintenance: Pap:  10-30-17 neg HPV HR neg History of abnormal Pap:  yes MMG:  10-31-17 birads 1:neg.  Aware this is over due.   Colonoscopy: none.  Dw pt today. BMD:   none TDaP:  2019 Screening Labs: 10/2019   reports that she has never smoked. She has never used smokeless tobacco. She reports current alcohol use. She reports that she does not use drugs.  Past Medical History:  Diagnosis Date  . Allergic rhinitis due to pollen   . Chronic cystitis   . Depression   . Dyspareunia in female   . Dyspnea on exertion    per pt hard to recover after exercise (cardiologist has echo and cardiac CT ordered when schedule is opened up)  . Essential hypertension    cardioloigst-  dr t. Duke Salvia-- FH premature CAD--- 08-18-2014 normal stress echo  . History of chronic bronchitis   . History of HPV infection    remote  . History of kidney stones   . History of recurrent UTIs   . Pure hypercholesterolemia 09/20/2018  . Renal calculus, left    per pt non-obstructive    Past Surgical History:  Procedure Laterality Date  . CESAREAN SECTION  2004, 2005  . CYSTOSCOPY N/A 11/06/2018   Procedure: CYSTOSCOPY;  Surgeon: Jerene Bears, MD;  Location: [facility];  Service: Gynecology;  Laterality: N/A;  . INTRAUTERINE DEVICE (IUD) INSERTION N/A 11/06/2018   Procedure: INTRAUTERINE DEVICE (IUD) INSERTION WITH ULTRASOUND GUIDANCE;  Surgeon: Jerene Bears, MD;  Location:  [facility] [place];  Service: Gynecology;  Laterality: N/A;  . IUD REMOVAL N/A 11/06/2018   Procedure: INTRAUTERINE DEVICE (IUD) REMOVAL;  Surgeon: Jerene Bears, MD;  Location: [facility];  Service: Gynecology;  Laterality: N/A;  Mirena IUD removal and reinsertion with ultrasound guidance.  Marland Kitchen KNEE ARTHROSCOPY Right 1995   with left knee realignment  . KNEE SURGERY Left 1991  . NASAL SINUS SURGERY  2015   wtih septoplasty  . TONSILLECTOMY  1987    Current Outpatient Medications  Medication Sig Dispense Refill  . albuterol (PROVENTIL HFA;VENTOLIN HFA) 108 (90 Base) MCG/ACT inhaler Inhale 2 puffs into the lungs every 6 (six) hours as needed.    . ALPRAZolam (XANAX) 0.25 MG tablet Take 1 tablet (0.25 mg total) by mouth 2 (two) times daily as needed for anxiety. 60 tablet 0  . ARNUITY ELLIPTA 100 MCG/ACT AEPB Inhale 1 puff into the lungs daily.     Marland Kitchen azelastine (ASTELIN) 0.1 % nasal spray Place into both nostrils 2 (two) times daily as needed. Use in each nostril as directed     . buPROPion (WELLBUTRIN XL) 300 MG 24 hr tablet Take 1 tablet (300 mg total) by mouth every morning. 90 tablet 1  . cetirizine (ZYRTEC) 10 MG tablet Take 10 mg  by mouth at bedtime.     . clindamycin-benzoyl peroxide (BENZACLIN) gel     . Eszopiclone 3 MG TABS Take 1 tablet (3 mg total) by mouth at bedtime as needed. 90 tablet 1  . fluticasone (FLONASE) 50 MCG/ACT nasal spray Place into both nostrils daily.     Marland Kitchen L-Lysine HCl 1000 MG TABS Take by mouth as needed.     Marland Kitchen levonorgestrel (MIRENA, 52 MG,) 20 MCG/24HR IUD 1 Intra Uterine Device (1 each total) by Intrauterine route once for 1 dose. (Patient taking differently: 1 each by Intrauterine route once. Placed 11/06/18) 1 each 0  . Probiotic Product (PROBIOTIC-10 PO) Take by mouth daily.    Marland Kitchen Spacer/Aero-Holding Rudean Curt Use as directed 1 each 1  . triamterene-hydrochlorothiazide (MAXZIDE-25) 37.5-25 MG tablet Take 1 tablet by mouth daily. 90  tablet 3  . valACYclovir (VALTREX) 500 MG tablet Take 1,000 mg by mouth 3 (three) times daily as needed. (Patient not taking: Reported on 12/05/2019)     No current facility-administered medications for this visit.    Family History  Problem Relation Age of Onset  . Hypertension Mother   . Hyperlipidemia Mother   . Rheum arthritis Mother   . Hypertension Father   . Hyperlipidemia Father   . Coronary artery disease Father        MI 61s, smoked, CABG, MULTIPLE STENTS  . Colon cancer Maternal Grandfather   . Bipolar disorder Sister   . Alcohol abuse Sister   . Depression Sister   . Rheum arthritis Sister   . Obesity Sister   . Parkinson's disease Maternal Grandmother   . Alzheimer's disease Paternal Grandmother     Review of Systems  Constitutional: Negative.   HENT: Negative.   Eyes: Negative.   Respiratory: Negative.   Cardiovascular: Negative.   Gastrointestinal: Negative.   Endocrine: Negative.   Genitourinary:       Irregular bleeding with iud  Musculoskeletal: Negative.   Skin: Negative.   Allergic/Immunologic: Negative.   Neurological: Negative.   Hematological: Negative.   Psychiatric/Behavioral: Negative.     Exam:   BP 110/74   Pulse 68   Resp 16   Ht 5' 5.75" (1.67 m)   Wt 142 lb (64.4 kg)   BMI 23.09 kg/m   Height: 5' 5.75" (167 cm)  General appearance: alert, cooperative and appears stated age Head: Normocephalic, without obvious abnormality, atraumatic Neck: no adenopathy, supple, symmetrical, trachea midline and thyroid normal to inspection and palpation Lungs: clear to auscultation bilaterally Breasts: normal appearance, no masses or tenderness Heart: regular rate and rhythm Abdomen: soft, non-tender; bowel sounds normal; no masses,  no organomegaly Extremities: extremities normal, atraumatic, no cyanosis or edema Skin: Skin color, texture, turgor normal. No rashes or lesions Lymph nodes: Cervical, supraclavicular, and axillary nodes normal. No  abnormal inguinal nodes palpated Neurologic: Grossly normal   Pelvic: External genitalia:  no lesions              Urethra:  normal appearing urethra with no masses, tenderness or lesions              Bartholins and Skenes: normal                 Vagina: normal appearing vagina with normal color and discharge, no lesions              Cervix: no lesions, 2cm IUD string noted              Pap taken:  Yes.   Bimanual Exam:  Uterus:  normal size, contour, position, consistency, mobility, non-tender              Adnexa: normal adnexa and no mass, fullness, tenderness               Rectovaginal: Confirms               Anus:  normal sphincter tone, no lesions  Chaperone, Cornelia Copa, CMA, was present for exam.  A:  Well Woman with normal exam Irregular bleeding with Mirena IUD H/O dyspareunia improved with vaginal estrogen Desires hormone levels be checked  P:   Mammogram is due.  Pt aware.  Information given. pap smear with neg HR HPV 2019.  Not indicated today. Return for PUS to assess IUD location Colonoscopy referral placed TSH, CBC, Estradiol, FSH levels will be obtained Vaccines reviewed Return annually or prn

## 2019-11-28 ENCOUNTER — Other Ambulatory Visit: Payer: Self-pay

## 2019-11-28 ENCOUNTER — Encounter: Payer: Self-pay | Admitting: Physician Assistant

## 2019-11-28 ENCOUNTER — Ambulatory Visit (INDEPENDENT_AMBULATORY_CARE_PROVIDER_SITE_OTHER): Payer: 59 | Admitting: Physician Assistant

## 2019-11-28 DIAGNOSIS — F411 Generalized anxiety disorder: Secondary | ICD-10-CM

## 2019-11-28 DIAGNOSIS — G4700 Insomnia, unspecified: Secondary | ICD-10-CM | POA: Diagnosis not present

## 2019-11-28 DIAGNOSIS — F3342 Major depressive disorder, recurrent, in full remission: Secondary | ICD-10-CM | POA: Diagnosis not present

## 2019-11-28 MED ORDER — BUPROPION HCL ER (XL) 300 MG PO TB24: 300.0000 mg | ORAL_TABLET | Freq: Every morning | ORAL | 1 refills | Status: DC

## 2019-11-28 MED ORDER — ESZOPICLONE 3 MG PO TABS: 3.0000 mg | ORAL_TABLET | Freq: Every evening | ORAL | 1 refills | Status: DC | PRN

## 2019-11-28 MED ORDER — ALPRAZOLAM 0.25 MG PO TABS: 0.2500 mg | ORAL_TABLET | Freq: Two times a day (BID) | ORAL | 0 refills | Status: DC | PRN

## 2019-11-28 NOTE — Progress Notes (Signed)
[facility] Med Check  Patient ID: Shelley Figueroa,  MRN: [id number]  PCP: Doreene Nest, NP  Date of Evaluation: 11/28/2019 Time spent:20 minutes  Chief Complaint:  Chief Complaint    Anxiety; Insomnia; Depression       HISTORY/CURRENT STATUS: HPI For routine 6 month med check.  Is doing really well!  She changed jobs within the same company and is much happier. Not traveling for work like she did.   Only problem is not feeling rested when she gets up. Gets 5-7 hours of sleep. Alfonso Patten does help her go to sleep, but sometimes she pushes through that while on electronics and then she's not as sleepy.  But she's trying to be better about. She doesn't really want to change anything but just wanted to make me aware.  Patient denies loss of interest in usual activities and is able to enjoy things.  Denies decreased energy or motivation.  Appetite has not changed.  No extreme sadness, tearfulness, or feelings of hopelessness.  Denies any changes in concentration, making decisions or remembering things.  Denies suicidal or homicidal thoughts.  Denies dizziness, syncope, seizures, numbness, tingling, tremor, tics, unsteady gait, slurred speech, confusion. Denies muscle or joint pain, stiffness, or dystonia.  Individual Medical History/ Review of Systems: Changes? :No    Past medications for mental health diagnoses include: Ambien, Lunesta, Wellbutrin, Xanax  Allergies: Clindamycin, Penicillins, Amoxicillin, Codeine, Contrast media [iodinated diagnostic agents], Lisinopril, Doxycycline, Erythromycin, Iohexol, and Neomycin  Current Medications:  Current Outpatient Medications:  .  albuterol (PROVENTIL HFA;VENTOLIN HFA) 108 (90 Base) MCG/ACT inhaler, Inhale 2 puffs into the lungs every 6 (six) hours as needed., Disp: , Rfl:  .  ALPRAZolam (XANAX) 0.25 MG tablet, Take 1 tablet (0.25 mg total) by mouth 2 (two) times daily as needed for anxiety., Disp: 60 tablet, Rfl: 0 .  ARNUITY  ELLIPTA 100 MCG/ACT AEPB, Inhale 1 puff into the lungs daily. , Disp: , Rfl:  .  azelastine (ASTELIN) 0.1 % nasal spray, Place into both nostrils 2 (two) times daily as needed. Use in each nostril as directed , Disp: , Rfl:  .  buPROPion (WELLBUTRIN XL) 300 MG 24 hr tablet, Take 1 tablet (300 mg total) by mouth every morning., Disp: 90 tablet, Rfl: 1 .  cetirizine (ZYRTEC) 10 MG tablet, Take 10 mg by mouth at bedtime. , Disp: , Rfl:  .  clindamycin-benzoyl peroxide (BENZACLIN) gel, , Disp: , Rfl:  .  estradiol (ESTRACE) 0.1 MG/GM vaginal cream, 1 gram vaginally twice weekly, Disp: 42.5 g, Rfl: 4 .  Eszopiclone 3 MG TABS, Take 1 tablet (3 mg total) by mouth at bedtime as needed., Disp: 90 tablet, Rfl: 1 .  fluticasone (FLONASE) 50 MCG/ACT nasal spray, Place into both nostrils daily. , Disp: , Rfl:  .  L-Lysine HCl 1000 MG TABS, Take by mouth as needed. , Disp: , Rfl:  .  Probiotic Product (PROBIOTIC-10 PO), Take by mouth daily., Disp: , Rfl:  .  Spacer/Aero-Holding Chambers DEVI, Use as directed, Disp: 1 each, Rfl: 1 .  triamterene-hydrochlorothiazide (MAXZIDE-25) 37.5-25 MG tablet, Take 1 tablet by mouth daily., Disp: 90 tablet, Rfl: 3 .  valACYclovir (VALTREX) 500 MG tablet, Take 1,000 mg by mouth 3 (three) times daily as needed., Disp: , Rfl:  .  levonorgestrel (MIRENA, 52 MG,) 20 MCG/24HR IUD, 1 Intra Uterine Device (1 each total) by Intrauterine route once for 1 dose. (Patient taking differently: 1 each by Intrauterine route once. Placed 11/06/18), Disp: 1 each, Rfl:  0 Medication Side Effects: none  Family Medical/ Social History: Changes? No  MENTAL HEALTH EXAM:  There were no vitals taken for this visit.There is no height or weight on file to calculate BMI.  General Appearance: Casual, Neat and Well Groomed  Eye Contact:  Good  Speech:  Clear and Coherent  Volume:  Normal  Mood:  Euthymic  Affect:  Appropriate  Thought Process:  Goal Directed and Descriptions of Associations: Intact   Orientation:  Full (Time, Place, and Person)  Thought Content: Logical   Suicidal Thoughts:  No  Homicidal Thoughts:  No  Memory:  WNL  Judgement:  Good  Insight:  Good  Psychomotor Activity:  Normal  Concentration:  Concentration: Good  Recall:  Good  Fund of Knowledge: Good  Language: Good  Assets:  Desire for Improvement  ADL's:  Intact  Cognition: WNL  Prognosis:  Good    DIAGNOSES:    ICD-10-CM   1. Insomnia, unspecified type  G47.00   2. Recurrent major depressive disorder, in full remission (HCC)  F33.42   3. Generalized anxiety disorder  F41.1     Receiving Psychotherapy: No    RECOMMENDATIONS:  PDMP was reviewed. I provided 20 minutes of face-to-face care during this encounter. We did discuss sleep hygiene.  She will let me know if she needs any help with the sleep from me.  She will be discussing possible hormone causes of fatigue with her gynecologist soon.  Also she is thinking about getting a new mattress.  So she wants to make sure those things help before changing meds. Continue Wellbutrin XL 300 mg 1 p.o. daily. Continue Xanax 0.25 mg twice daily as needed.  She takes very rarely. Continue Lunesta 3 mg nightly as needed. Return in 6 months.  Melony Overly, PA-C

## 2019-12-05 ENCOUNTER — Telehealth: Payer: Self-pay | Admitting: Obstetrics & Gynecology

## 2019-12-05 ENCOUNTER — Encounter: Payer: Self-pay | Admitting: Obstetrics & Gynecology

## 2019-12-05 ENCOUNTER — Ambulatory Visit (INDEPENDENT_AMBULATORY_CARE_PROVIDER_SITE_OTHER): Payer: 59 | Admitting: Obstetrics & Gynecology

## 2019-12-05 ENCOUNTER — Other Ambulatory Visit: Payer: Self-pay

## 2019-12-05 VITALS — BP 110/74 | HR 68 | Resp 16 | Ht 65.75 in | Wt 142.0 lb

## 2019-12-05 DIAGNOSIS — Z1211 Encounter for screening for malignant neoplasm of colon: Secondary | ICD-10-CM

## 2019-12-05 DIAGNOSIS — Z0000 Encounter for general adult medical examination without abnormal findings: Secondary | ICD-10-CM

## 2019-12-05 DIAGNOSIS — Z01419 Encounter for gynecological examination (general) (routine) without abnormal findings: Secondary | ICD-10-CM

## 2019-12-05 DIAGNOSIS — N926 Irregular menstruation, unspecified: Secondary | ICD-10-CM

## 2019-12-05 NOTE — Telephone Encounter (Signed)
Spoke with patient regarding benefits for recommended ultrasound. Patient is aware that ultrasound is transvaginal. Patient acknowledges understanding of information presented. Patient is aware of cancellation policy. Patient scheduled appointment for 12/19/2019 at 0300PM with M. Leda Quail, MD. Encounter closed.

## 2019-12-06 LAB — FOLLICLE STIMULATING HORMONE: FSH: 4.1 m[IU]/mL

## 2019-12-06 LAB — TSH: TSH: 1.29 u[IU]/mL (ref 0.450–4.500)

## 2019-12-06 LAB — CBC
Hematocrit: 39.2 % (ref 34.0–46.6)
Hemoglobin: 13.6 g/dL (ref 11.1–15.9)
MCH: 31.3 pg (ref 26.6–33.0)
MCHC: 34.7 g/dL (ref 31.5–35.7)
MCV: 90 fL (ref 79–97)
Platelets: 265 10*3/uL (ref 150–450)
RBC: 4.34 x10E6/uL (ref 3.77–5.28)
RDW: 12.6 % (ref 11.7–15.4)
WBC: 6.2 10*3/uL (ref 3.4–10.8)

## 2019-12-06 LAB — ESTRADIOL: Estradiol: 115 pg/mL

## 2019-12-19 ENCOUNTER — Ambulatory Visit (INDEPENDENT_AMBULATORY_CARE_PROVIDER_SITE_OTHER): Payer: 59 | Admitting: Obstetrics & Gynecology

## 2019-12-19 ENCOUNTER — Encounter: Payer: Self-pay | Admitting: Obstetrics & Gynecology

## 2019-12-19 ENCOUNTER — Ambulatory Visit (INDEPENDENT_AMBULATORY_CARE_PROVIDER_SITE_OTHER): Payer: 59

## 2019-12-19 ENCOUNTER — Other Ambulatory Visit: Payer: Self-pay

## 2019-12-19 VITALS — BP 128/80 | HR 84 | Resp 10 | Ht 66.0 in | Wt 142.0 lb

## 2019-12-19 DIAGNOSIS — N841 Polyp of cervix uteri: Secondary | ICD-10-CM

## 2019-12-19 DIAGNOSIS — Z975 Presence of (intrauterine) contraceptive device: Secondary | ICD-10-CM

## 2019-12-19 DIAGNOSIS — N926 Irregular menstruation, unspecified: Secondary | ICD-10-CM | POA: Diagnosis not present

## 2019-12-19 NOTE — Progress Notes (Signed)
44 y.o. G11P1102 Married White or Caucasian female here for pelvic ultrasound due to bleeding with Mirena IUD.  This is pt's second IUD.  She really didn't bleed with her first IUD.  Bleeding isnt' heavy but different and concerning for pt.  Patient's last menstrual period was 12/05/2019.  Contraception: IUD  Findings:  UTERUS: 9.1 x 4.7 x 4.2cm  EMS: 4.16mm with IUD in correct location, probable cervical polyp measuring 6 x 6 x 49mm noted ADNEXA: Left ovary: 3.7 x 2.7 x 2.4cm with 2.7x 1.7cm follicle       Right ovary: 3.5 x 2.2 x 1.5cm CUL DE SAC: no free fluid  Discussion:  Findings discussed with pt.  I do not think the cervical polyp would cause the amount of bleeding she has and she does not typically have any spotting.  Pt is most concerned about the location of her IUD and is satisfied that this looks like normal placement.  Pt and I discussed cervical polyp resection but, again, I don't think this will cause the bleeding to resolve.  She is not interested in having the polyp removed unless has IUD removed/replaced and she is not ready for this.  Is going to continue to monitor.  Assessment:  Cervical polyp Menstrual bleeding with Mirena IUD (that she did not have with prior IUD)  Plan:  Pt is going to monitor bleeding and let me know if worsens.  If we do end up removing and replacing IUD, she knows I would recommend removal of cervical polyp as the same time.  She is comfortable with this plan.  20 minutes total spent in discussion with pt

## 2020-01-07 ENCOUNTER — Encounter: Payer: Self-pay | Admitting: Primary Care

## 2020-01-09 ENCOUNTER — Encounter: Payer: Self-pay | Admitting: Primary Care

## 2020-01-22 ENCOUNTER — Other Ambulatory Visit: Payer: Self-pay

## 2020-01-22 ENCOUNTER — Other Ambulatory Visit: Payer: 59

## 2020-01-22 DIAGNOSIS — Z20822 Contact with and (suspected) exposure to covid-19: Secondary | ICD-10-CM

## 2020-01-23 LAB — NOVEL CORONAVIRUS, NAA: SARS-CoV-2, NAA: NOT DETECTED

## 2020-01-23 LAB — SARS-COV-2, NAA 2 DAY TAT

## 2020-01-31 ENCOUNTER — Other Ambulatory Visit: Payer: Self-pay | Admitting: Obstetrics & Gynecology

## 2020-01-31 DIAGNOSIS — Z1231 Encounter for screening mammogram for malignant neoplasm of breast: Secondary | ICD-10-CM

## 2020-02-11 ENCOUNTER — Ambulatory Visit
Admission: RE | Admit: 2020-02-11 | Discharge: 2020-02-11 | Disposition: A | Discharge status: Home / Self Care | Payer: 59 | Source: Ambulatory Visit | Attending: Obstetrics & Gynecology | Admitting: Obstetrics & Gynecology

## 2020-02-11 ENCOUNTER — Other Ambulatory Visit: Payer: Self-pay

## 2020-02-11 DIAGNOSIS — Z1231 Encounter for screening mammogram for malignant neoplasm of breast: Secondary | ICD-10-CM

## 2020-02-14 ENCOUNTER — Ambulatory Visit (INDEPENDENT_AMBULATORY_CARE_PROVIDER_SITE_OTHER): Payer: 59 | Admitting: Cardiovascular Disease

## 2020-02-14 ENCOUNTER — Encounter: Payer: Self-pay | Admitting: Cardiovascular Disease

## 2020-02-14 ENCOUNTER — Other Ambulatory Visit: Payer: Self-pay

## 2020-02-14 VITALS — BP 122/74 | HR 79 | Temp 97.5°F | Ht 66.0 in | Wt 149.4 lb

## 2020-02-14 DIAGNOSIS — I10 Essential (primary) hypertension: Secondary | ICD-10-CM

## 2020-02-14 DIAGNOSIS — E785 Hyperlipidemia, unspecified: Secondary | ICD-10-CM

## 2020-02-14 NOTE — Progress Notes (Signed)
Cardiology Office Note    Evaluation Performed:  Follow-up visit  Date:  02/14/2020   ID:  Shelley Figueroa, DOB [birth year], MRN [id number]  PCP:  Doreene Nest, NP  Cardiologist:  Chilton Si, MD  Electrophysiologist:  None   Chief Complaint: hypertension  History of Present Illness:    Shelley Figueroa is a 44 y.o. female with hypertension and chronic bronchitis here for follow-up on hypertension and family history of heart disease.   Shelley Figueroa was initially diagnosed with hypertension around 2016. She initially tried lisinopril but developed a cough.  It was well-controlled on hydrochlorothiazide.  However Shelley Figueroa blood pressure then started to get higher.  She does have a stressful job but this had not changed. Shelley Figueroa is also concerned about a family history of heart disease.  Both Shelley Figueroa parents smoked heavily.  Shelley Figueroa father had a heart attack in his early 42s and multiple stents placed.  He later went on to have quadruple bypass surgery.  Shelley Figueroa had a stress echo in 2015 that was negative for ischemia.  Shelley Figueroa symptoms were thought to be musculoskeletal.  Since Shelley Figueroa last appointment Shelley Figueroa followed up with Joni Reining and reported exertional dyspnea.  She had an echo 12/2018 that revealed normal systolic function and diastolic function.  It was otherwise unremarkable.  She also had a coronary CTA at that time that revealed no coronary artery disease and normal anatomy.  She did have a contrast reaction that required steroids.  In March she changed jobs and is mostly at Shelley Figueroa desk.  She did purchase a standing desk which has helped.  She has gained weight and isn't exercising as much.  When she checks Shelley Figueroa BP at home it is typically well-controlled.  She is otherwise been feeling well and has no chest pain or shortness of breath.  She denies lower extremity edema, orthopnea, or PND.  Past Medical History:  Diagnosis Date  . Allergic rhinitis due to pollen     . Chronic cystitis   . Depression   . Dyspareunia in female   . Dyspnea on exertion    per pt hard to recover after exercise (cardiologist has echo and cardiac CT ordered when schedule is opened up)  . Essential hypertension    cardioloigst-  dr t. [facility]-- FH premature CAD--- 08-18-2014 normal stress echo  . History of chronic bronchitis   . History of HPV infection    remote  . History of kidney stones   . History of recurrent UTIs   . Pure hypercholesterolemia 09/20/2018  . Renal calculus, left    per pt non-obstructive   Past Surgical History:  Procedure Laterality Date  . CESAREAN SECTION  2004, 2005  . CYSTOSCOPY N/A 11/06/2018   Procedure: CYSTOSCOPY;  Surgeon: Jerene Bears, MD;  Location: [facility];  Service: Gynecology;  Laterality: N/A;  . INTRAUTERINE DEVICE (IUD) INSERTION N/A 11/06/2018   Procedure: INTRAUTERINE DEVICE (IUD) INSERTION WITH ULTRASOUND GUIDANCE;  Surgeon: Jerene Bears, MD;  Location: [facility];  Service: Gynecology;  Laterality: N/A;  . IUD REMOVAL N/A 11/06/2018   Procedure: INTRAUTERINE DEVICE (IUD) REMOVAL;  Surgeon: Jerene Bears, MD;  Location: [facility];  Service: Gynecology;  Laterality: N/A;  Mirena IUD removal and reinsertion with ultrasound guidance.  Marland Kitchen KNEE ARTHROSCOPY Right 1995   with left knee realignment  . KNEE SURGERY Left 1991  . NASAL SINUS SURGERY  2015   wtih septoplasty  .  TONSILLECTOMY  1987     Current Meds  Medication Sig  . albuterol (PROVENTIL HFA;VENTOLIN HFA) 108 (90 Base) MCG/ACT inhaler Inhale 2 puffs into the lungs every 6 (six) hours as needed.  . ALPRAZolam (XANAX) 0.25 MG tablet Take 1 tablet (0.25 mg total) by mouth 2 (two) times daily as needed for anxiety.  . ARNUITY ELLIPTA 100 MCG/ACT AEPB Inhale 1 puff into the lungs daily.   Marland Kitchen azelastine (ASTELIN) 0.1 % nasal spray Place into both nostrils 2 (two) times daily as needed. Use in each nostril as directed   .  benzonatate (TESSALON) 100 MG capsule Take 100 mg by mouth 3 (three) times daily.  Marland Kitchen buPROPion (WELLBUTRIN XL) 300 MG 24 hr tablet Take 1 tablet (300 mg total) by mouth every morning.  . cetirizine (ZYRTEC) 10 MG tablet Take 10 mg by mouth at bedtime.   . clindamycin-benzoyl peroxide (BENZACLIN) gel   . Eszopiclone 3 MG TABS Take 1 tablet (3 mg total) by mouth at bedtime as needed.  . fluconazole (DIFLUCAN) 150 MG tablet Take by mouth.  . fluticasone (FLONASE) 50 MCG/ACT nasal spray Place into both nostrils daily.   Marland Kitchen L-Lysine HCl 1000 MG TABS Take by mouth as needed.   . Probiotic Product (PROBIOTIC-10 PO) Take by mouth daily.  Marland Kitchen triamterene-hydrochlorothiazide (MAXZIDE-25) 37.5-25 MG tablet Take 1 tablet by mouth daily.  . valACYclovir (VALTREX) 500 MG tablet Take 1,000 mg by mouth 3 (three) times daily as needed.   . [DISCONTINUED] predniSONE (DELTASONE) 10 MG tablet Take by mouth.  . [DISCONTINUED] sulfamethoxazole-trimethoprim (BACTRIM DS) 800-160 MG tablet Take 1 tablet by mouth 2 (two) times daily.     Allergies:   Clindamycin, Penicillins, Amoxicillin, Codeine, Contrast media [iodinated diagnostic agents], Lisinopril, Doxycycline, Erythromycin, Iohexol, and Neomycin   Social History   Tobacco Use  . Smoking status: Never Smoker  . Smokeless tobacco: Never Used  Vaping Use  . Vaping Use: Never used  Substance Use Topics  . Alcohol use: Yes    Alcohol/week: 0.0 - 1.0 standard drinks  . Drug use: Never     Family Hx: The patient's family history includes Alcohol abuse in Shelley Figueroa sister; Alzheimer's disease in Shelley Figueroa paternal grandmother; Bipolar disorder in Shelley Figueroa sister; Colon cancer in Shelley Figueroa maternal grandfather; Coronary artery disease in Shelley Figueroa father; Depression in Shelley Figueroa sister; Hyperlipidemia in Shelley Figueroa father and mother; Hypertension in Shelley Figueroa father and mother; Obesity in Shelley Figueroa sister; Parkinson's disease in Shelley Figueroa maternal grandmother; Rheum arthritis in Shelley Figueroa mother and sister.  ROS:   Please see  the history of present illness.     All other systems reviewed and are negative.   Prior CV studies:   The following studies were reviewed today:  Stress echo 08/18/14: Study Conclusions  - Stress ECG conclusions: There were no stress arrhythmias or conduction abnormalities. The stress ECG was normal. - Staged echo: Left ventricular ejection fraction was normal at rest and with stress. Normal echo stress.  LVEF 60%.  Echo 01/24/19: IMPRESSIONS:  1. The left ventricle has hyperdynamic systolic function, with an  ejection fraction of >65%. The cavity size was normal. Left ventricular  diastolic parameters were normal.  2. The average left ventricular global longitudinal strain is -23.9 %.  3. The right ventricle has normal systolic function. The cavity was  normal. There is no increase in right ventricular wall thickness.  4. The aorta is normal unless otherwise noted.   FINDINGS  Left Ventricle: The left ventricle has hyperdynamic systolic function,  with  an ejection fraction of >65%. The cavity size was normal. There is no  increase in left ventricular wall thickness. Left ventricular diastolic  parameters were normal. Normal left  ventricular filling pressures The average left ventricular global  longitudinal strain is -23.9 %.   Coronary CT-A 01/25/19:  IMPRESSION: 1. Coronary calcium score of 0. This was 0 percentile for age and sex matched control. 2. Normal coronary origin with right dominance. 3. No evidence of CAD.  Labs/Other Tests and Data Reviewed:    EKG:  An ECG dated 07/20/14 was personally reviewed today and demonstrated:  sinus rhythm.  Rate 87 bpm  02/14/2020: Sinus rhythm.  Rate 79 bpm.  Recent Labs: 11/06/2019: BUN 16; Creatinine, Ser 0.86; Potassium 3.9; Sodium 140 12/05/2019: Hemoglobin 13.6; Platelets 265; TSH 1.290   Recent Lipid Panel Lab Results  Component Value Date/Time   CHOL 224 (H) 11/06/2019 09:14 AM   TRIG 90 11/06/2019 09:14 AM     HDL 70 11/06/2019 09:14 AM   CHOLHDL 3.2 11/06/2019 09:14 AM   CHOLHDL 4 03/30/2018 08:04 AM   LDLCALC 138 (H) 11/06/2019 09:14 AM    Wt Readings from Last 3 Encounters:  02/14/20 149 lb 6.4 oz (67.8 kg)  12/19/19 142 lb (64.4 kg)  12/05/19 142 lb (64.4 kg)     Objective:    VS:  BP 122/74   Pulse 79   Temp (!) 97.5 F (36.4 C)   Ht 5\' 6"  (1.676 m)   Wt 149 lb 6.4 oz (67.8 kg)   SpO2 98%   BMI 24.11 kg/m  , BMI Body mass index is 24.11 kg/m. GENERAL:  Well appearing HEENT: Pupils equal round and reactive, fundi not visualized, oral mucosa unremarkable NECK:  No jugular venous distention, waveform within normal limits, carotid upstroke brisk and symmetric, no bruits, no thyromegaly LYMPHATICS:  No cervical adenopathy LUNGS:  Clear to auscultation bilaterally HEART:  RRR.  PMI not displaced or sustained,S1 and S2 within normal limits, no S3, no S4, no clicks, no rubs, no murmurs ABD:  Flat, positive bowel sounds normal in frequency in pitch, no bruits, no rebound, no guarding, no midline pulsatile mass, no hepatomegaly, no splenomegaly EXT:  2 plus pulses throughout, no edema, no cyanosis no clubbing SKIN:  No rashes no nodules NEURO:  Cranial nerves II through XII grossly intact, motor grossly intact throughout PSYCH:  Cognitively intact, oriented to person place and time   ASSESSMENT & PLAN:    # Hypertension: Blood pressure well-controlled on HCTZ/triamterene.  No changes.  # CV Risk Assessment: # FH Premature CAD: Shelley Figueroa has a family history of premature coronary artery disease.  However she had a coronary CT-a that showed no CAD.  Shelley Figueroa calcium score was 0.  Therefore we will continue to work on primary prevention.  Shelley Figueroa lipids are elevated but Shelley Figueroa ASCVD 10-year risk is 0.9%.  Therefore statins are not indicated.  Diet and exercise as above.     Medication Adjustments/Labs and Tests Ordered: Current medicines are reviewed at length with the patient today.   Concerns regarding medicines are outlined above.   Tests Ordered: Orders Placed This Encounter  Procedures  . EKG 12-Lead    Medication Changes: No orders of the defined types were placed in this encounter.   Disposition:  Follow up in 2 year(s)  Signed, Hilda Blades, MD  02/14/2020 1:21 PM    [facility]

## 2020-02-14 NOTE — Patient Instructions (Signed)
Medication Instructions:  Your physician recommends that you continue on your current medications as directed. Please refer to the Current Medication list given to you today.  *If you need a refill on your cardiac medications before your next appointment, please call your pharmacy*  Lab Work: NONE  Testing/Procedures: NONE  Follow-Up: At [facility], you and your health needs are our priority.  As part of our continuing mission to provide you with exceptional heart care, we have created designated Provider Care Teams.  These Care Teams include your primary Cardiologist (physician) and Advanced Practice Providers (APPs -  Physician Assistants and Nurse Practitioners) who all work together to provide you with the care you need, when you need it.  We recommend signing up for the patient portal called "MyChart".  Sign up information is provided on this After Visit Summary.  MyChart is used to connect with patients for Virtual Visits (Telemedicine).  Patients are able to view lab/test results, encounter notes, upcoming appointments, etc.  Non-urgent messages can be sent to your provider as well.   To learn more about what you can do with MyChart, go to [email or web address].    Your next appointment:   2 year(s)  The format for your next appointment:   In Person  Provider:   You may see Chilton Si, MD or one of the following Advanced Practice Providers on your designated Care Team:    Corine Shelter, PA-C  [place], [place]  Edd Fabian, [place]

## 2020-02-21 LAB — HM COLONOSCOPY

## 2020-03-05 ENCOUNTER — Encounter: Payer: Self-pay | Admitting: Primary Care

## 2020-03-16 ENCOUNTER — Other Ambulatory Visit: Payer: Self-pay

## 2020-03-16 ENCOUNTER — Other Ambulatory Visit: Payer: 59

## 2020-03-16 DIAGNOSIS — Z20822 Contact with and (suspected) exposure to covid-19: Secondary | ICD-10-CM

## 2020-03-17 LAB — NOVEL CORONAVIRUS, NAA: SARS-CoV-2, NAA: NOT DETECTED

## 2020-03-17 LAB — SARS-COV-2, NAA 2 DAY TAT

## 2020-05-07 ENCOUNTER — Other Ambulatory Visit: Payer: Self-pay

## 2020-05-18 ENCOUNTER — Other Ambulatory Visit: Payer: 59

## 2020-05-18 DIAGNOSIS — Z20822 Contact with and (suspected) exposure to covid-19: Secondary | ICD-10-CM

## 2020-05-20 LAB — NOVEL CORONAVIRUS, NAA: SARS-CoV-2, NAA: NOT DETECTED

## 2020-05-20 LAB — SARS-COV-2, NAA 2 DAY TAT

## 2020-05-21 ENCOUNTER — Other Ambulatory Visit: Payer: Self-pay | Admitting: Physician Assistant

## 2020-06-04 ENCOUNTER — Other Ambulatory Visit: Payer: Self-pay

## 2020-06-04 ENCOUNTER — Ambulatory Visit (INDEPENDENT_AMBULATORY_CARE_PROVIDER_SITE_OTHER): Payer: 59 | Admitting: Physician Assistant

## 2020-06-04 ENCOUNTER — Encounter: Payer: Self-pay | Admitting: Physician Assistant

## 2020-06-04 DIAGNOSIS — F3342 Major depressive disorder, recurrent, in full remission: Secondary | ICD-10-CM | POA: Diagnosis not present

## 2020-06-04 DIAGNOSIS — G4700 Insomnia, unspecified: Secondary | ICD-10-CM

## 2020-06-04 DIAGNOSIS — F411 Generalized anxiety disorder: Secondary | ICD-10-CM | POA: Diagnosis not present

## 2020-06-04 MED ORDER — ALPRAZOLAM 0.25 MG PO TABS: 0.2500 mg | ORAL_TABLET | Freq: Two times a day (BID) | ORAL | 2 refills | Status: DC | PRN

## 2020-06-04 NOTE — Progress Notes (Signed)
[facility] Med Check  Patient ID: Shelley Figueroa,  MRN: [id number]  PCP: Doreene Nest, NP  Date of Evaluation: 06/04/2020 Time spent:20 minutes  Chief Complaint:  Chief Complaint    Anxiety; Depression; Insomnia       HISTORY/CURRENT STATUS: HPI For routine 6 month med check.  Is more stressed, her oldest son is a Arts administrator at [employer], they're no longer mandating masks and she worries about that. She's had to take more Xanax, which is helpful, but she doesn't want to need it so much. Only takes a 1/2 pill and even then it's only 4 times a week on avg.  She feels guilty about taking it though. She knows she needs it in social situations, like going to her son's basketball game last night.   Other than that, she is doing well.  She is able to enjoy things.  Denies decreased energy or motivation.  Appetite has not changed.  No extreme sadness, tearfulness, or feelings of hopelessness.  Denies any changes in concentration, making decisions or remembering things.  Work is going well denies suicidal or homicidal thoughts.  Sleeps well as long as she has the [place].  Since she has had more stress, she does have racing thoughts in the evenings and it is hard to get her mind to slow down so she can relax to go to sleep.  She sometimes needs the Xanax a few hours before she is going to lie down so she can quiet her mind to be able to go to sleep.  She already practices good sleep hygiene.  Denies dizziness, syncope, seizures, numbness, tingling, tremor, tics, unsteady gait, slurred speech, confusion. Denies muscle or joint pain, stiffness, or dystonia.  Individual Medical History/ Review of Systems: Changes? :No    Past medications for mental health diagnoses include: Ambien, Lunesta, Wellbutrin, Xanax  Allergies: Clindamycin, Penicillins, Amoxicillin, Codeine, Contrast media [iodinated diagnostic agents], Lisinopril, Doxycycline, Erythromycin, Iohexol, and  Neomycin  Current Medications:  Current Outpatient Medications:  .  albuterol (PROVENTIL HFA;VENTOLIN HFA) 108 (90 Base) MCG/ACT inhaler, Inhale 2 puffs into the lungs every 6 (six) hours as needed., Disp: , Rfl:  .  ARNUITY ELLIPTA 100 MCG/ACT AEPB, Inhale 1 puff into the lungs daily. , Disp: , Rfl:  .  azelastine (ASTELIN) 0.1 % nasal spray, Place into both nostrils 2 (two) times daily as needed. Use in each nostril as directed, Disp: , Rfl:  .  benzonatate (TESSALON) 100 MG capsule, Take 100 mg by mouth 3 (three) times daily., Disp: , Rfl:  .  buPROPion (WELLBUTRIN XL) 300 MG 24 hr tablet, TAKE 1 TABLET BY MOUTH EVERY DAY IN THE MORNING, Disp: 90 tablet, Rfl: 1 .  cetirizine (ZYRTEC) 10 MG tablet, Take 10 mg by mouth at bedtime., Disp: , Rfl:  .  clindamycin-benzoyl peroxide (BENZACLIN) gel, , Disp: , Rfl:  .  Eszopiclone 3 MG TABS, TAKE 1 TABLET (3 MG TOTAL) BY MOUTH AT BEDTIME AS NEEDED., Disp: 90 tablet, Rfl: 0 .  fluticasone (FLONASE) 50 MCG/ACT nasal spray, Place into both nostrils daily. , Disp: , Rfl:  .  L-Lysine HCl 1000 MG TABS, Take by mouth as needed. , Disp: , Rfl:  .  Probiotic Product (PROBIOTIC-10 PO), Take by mouth daily., Disp: , Rfl:  .  triamterene-hydrochlorothiazide (MAXZIDE-25) 37.5-25 MG tablet, Take 1 tablet by mouth daily., Disp: 90 tablet, Rfl: 3 .  valACYclovir (VALTREX) 500 MG tablet, Take 1,000 mg by mouth 3 (three) times daily as needed. ,  Disp: , Rfl:  .  ALPRAZolam (XANAX) 0.25 MG tablet, Take 1 tablet (0.25 mg total) by mouth 2 (two) times daily as needed for anxiety., Disp: 60 tablet, Rfl: 2 .  CLENPIQ 10-3.5-12 MG-GM -GM/160ML SOLN, SMARTSIG:160 Milliliter(s) By Mouth As Directed (Patient not taking: Reported on 06/04/2020), Disp: , Rfl:  .  fluconazole (DIFLUCAN) 150 MG tablet, Take by mouth. (Patient not taking: Reported on 06/04/2020), Disp: , Rfl:  .  levonorgestrel (MIRENA, 52 MG,) 20 MCG/24HR IUD, 1 Intra Uterine Device (1 each total) by Intrauterine route  once for 1 dose. (Patient taking differently: 1 each by Intrauterine route once. Placed 11/06/18), Disp: 1 each, Rfl: 0 .  nitrofurantoin, macrocrystal-monohydrate, (MACROBID) 100 MG capsule, Take 100 mg by mouth 2 (two) times daily. (Patient not taking: Reported on 06/04/2020), Disp: , Rfl:  Medication Side Effects: none  Family Medical/ Social History: Changes? No  MENTAL HEALTH EXAM:  There were no vitals taken for this visit.There is no height or weight on file to calculate BMI.  General Appearance: Casual, Neat and Well Groomed  Eye Contact:  Good  Speech:  Clear and Coherent  Volume:  Normal  Mood:  Euthymic  Affect:  Appropriate  Thought Process:  Goal Directed and Descriptions of Associations: Intact  Orientation:  Full (Time, Place, and Person)  Thought Content: Logical   Suicidal Thoughts:  No  Homicidal Thoughts:  No  Memory:  WNL  Judgement:  Good  Insight:  Good  Psychomotor Activity:  Normal  Concentration:  Concentration: Good and Attention Span: Good  Recall:  Good  Fund of Knowledge: Good  Language: Good  Assets:  Desire for Improvement  ADL's:  Intact  Cognition: WNL  Prognosis:  Good    DIAGNOSES:    ICD-10-CM   1. Generalized anxiety disorder  F41.1   2. Recurrent major depressive disorder, in full remission (HCC)  F33.42   3. Insomnia, unspecified type  G47.00     Receiving Psychotherapy: No    RECOMMENDATIONS:  PDMP was reviewed. I provided 20 minutes of face-to-face care during this encounter in which we discussed the anxiety and treatment.  It is completely fine for her to take the Xanax as she is.  Even though she has needed it more than usual, she is still taking a very low-dose and infrequently.  If she is needing more and more, she should let me know and we will need to add something to help prevent the anxiety. Practice good sleep hygiene as she has been doing  continue Wellbutrin XL 300 mg 1 p.o. daily. Continue Xanax 0.25 mg twice daily as  needed.  She takes very rarely. Continue Lunesta 3 mg nightly as needed. Return in 6 months.  Melony Overly, PA-C

## 2020-06-24 ENCOUNTER — Other Ambulatory Visit: Payer: 59

## 2020-06-24 DIAGNOSIS — Z20822 Contact with and (suspected) exposure to covid-19: Secondary | ICD-10-CM

## 2020-06-25 LAB — SARS-COV-2, NAA 2 DAY TAT

## 2020-06-25 LAB — NOVEL CORONAVIRUS, NAA: SARS-CoV-2, NAA: NOT DETECTED

## 2020-08-13 ENCOUNTER — Other Ambulatory Visit: Payer: Self-pay | Admitting: Cardiology

## 2020-08-21 ENCOUNTER — Other Ambulatory Visit: Payer: Self-pay | Admitting: Physician Assistant

## 2020-08-22 NOTE — Telephone Encounter (Signed)
Next apt 6/28

## 2020-08-24 IMAGING — CT CT HEAR MORPH WITH CTA COR WITH SCORE WITH CA WITH CONTRAST AND
4 of 7 series · 8 of 20 positions shown, 9 images · IV contrast (APPLIED)
Comparison: None.
COMPARISON: None.

Addendum:
EXAM:
OVER-READ INTERPRETATION  CT CHEST

The following report is an over-read performed by radiologist Dr.
Kalaitzoglou Taverna [REDACTED] on 01/25/2019. This
over-read does not include interpretation of cardiac or coronary
anatomy or pathology. The coronary calcium score/coronary CTA
interpretation by the cardiologist is attached.
CLINICAL DATA: 43F with hypertension, family history of premature
CAD, and exertional dyspnea.
Cardiac/Coronary  CT
TECHNIQUE: The patient was scanned on a Phillips Force scanner.

[Series 6: best diast 72 % · axial · 0.39mm/px · z∈[-124,-74]mm · 2 of 372 slices shown, 3 images]
[im 124/372  vessel]
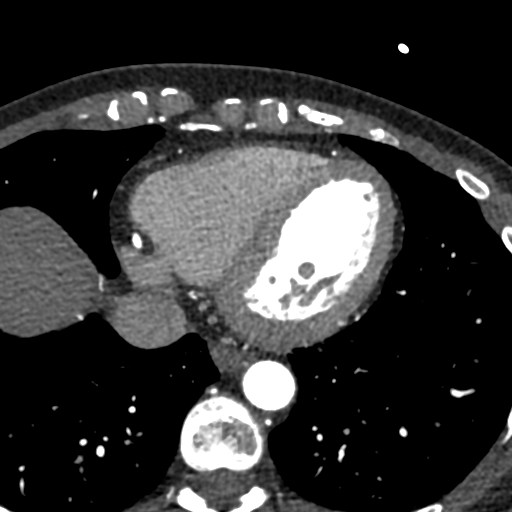
[im 124/372  lung]
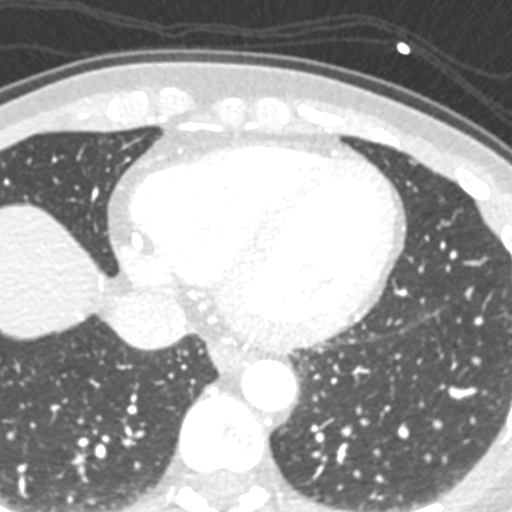
[im 248/372  vessel]
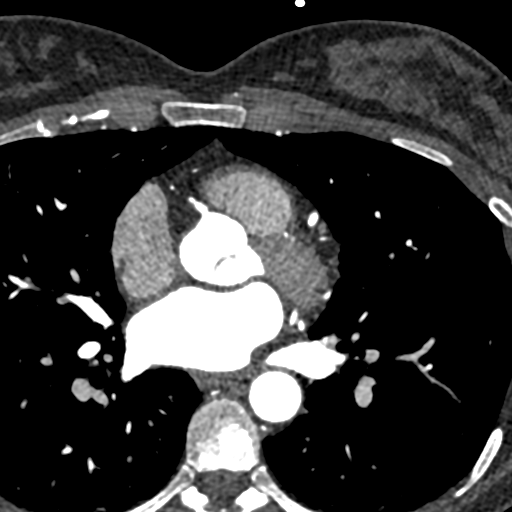

[Series 7: best syst 72 % · axial · 0.39mm/px · z∈[-124,-74]mm · 2 of 372 slices shown]
[im 124/372  vessel]
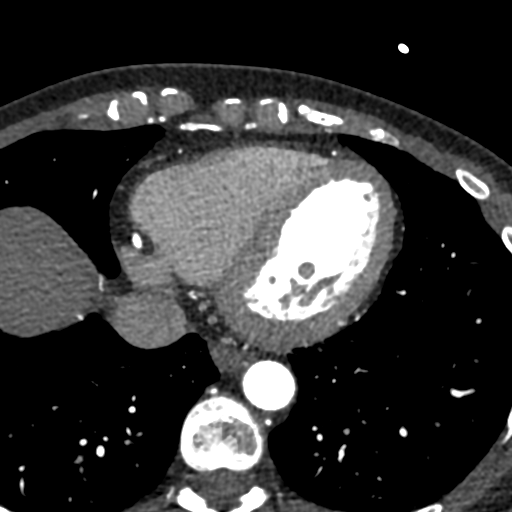
[im 248/372  vessel]
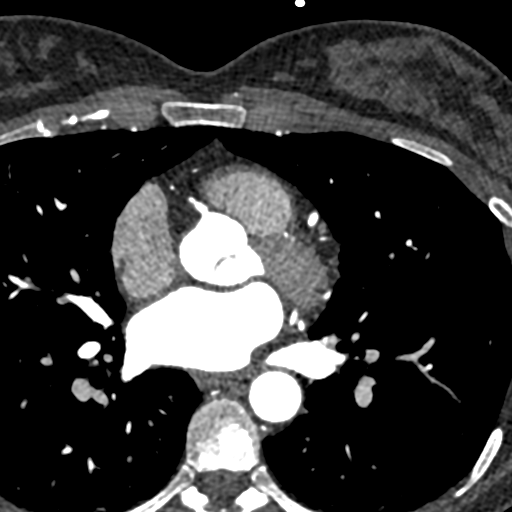

[Series 8: ts diast sharp 72 % · axial · 0.39mm/px · z∈[-124,-74]mm · 2 of 372 slices shown]
[im 124/372  lung]
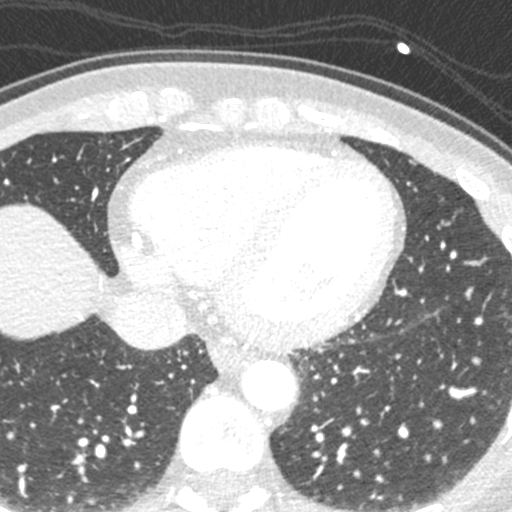
[im 248/372  lung]
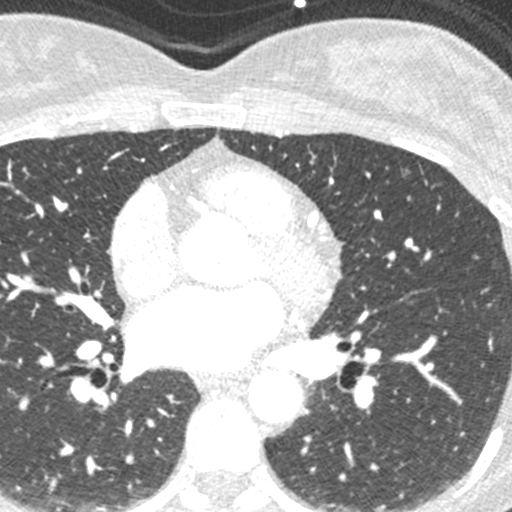

[Series 9: ts syst sharp 72 % · axial · 0.39mm/px · z∈[-124,-74]mm · 2 of 372 slices shown]
[im 124/372  lung]
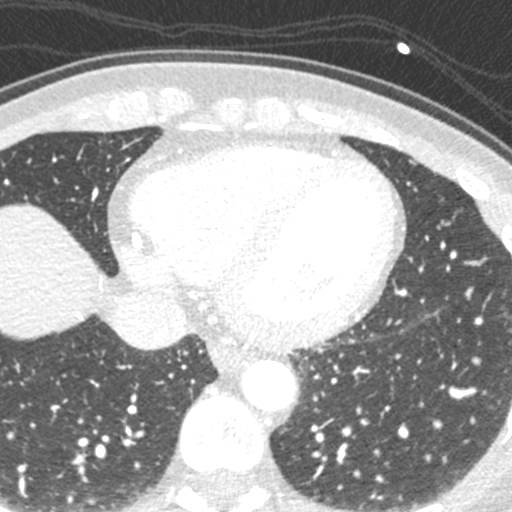
[im 248/372  lung]
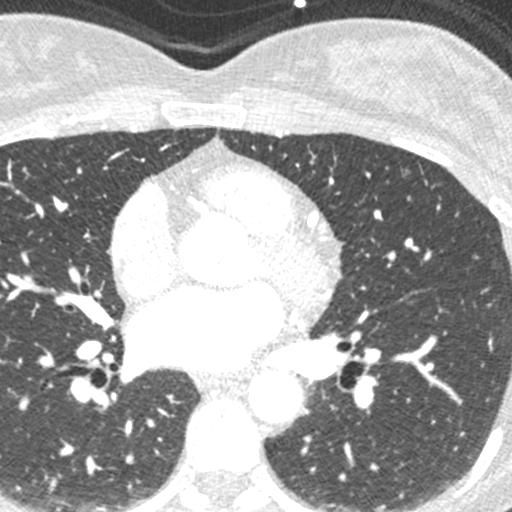

[8 of 20 positions shown; findings below may reference images not displayed]

FINDINGS: Within the visualized portions of the thorax there are no suspicious
appearing pulmonary nodules or masses, there is no acute
consolidative airspace disease, no pleural effusions, no
pneumothorax and no lymphadenopathy. Visualized portions of the
upper abdomen are unremarkable. There are no aggressive appearing
lytic or blastic lesions noted in the visualized portions of the
skeleton.
IMPRESSION: No significant incidental noncardiac findings are noted.
FINDINGS: A 120 kV prospective scan was triggered in the descending thoracic
aorta at 111 HU's. Axial non-contrast 3 mm slices were carried out
through the heart. The data set was analyzed on a dedicated work
station and scored using the Agatson method. Gantry rotation speed
was 250 msecs and collimation was .6 mm. No beta blockade and 0.8 mg
of sl NTG was given. The 3D data set was reconstructed in 5%
intervals of the 67-82 % of the R-R cycle. Diastolic phases were
analyzed on a dedicated work station using MPR, MIP and VRT modes.
The patient received 80 cc of contrast.

Aorta: Normal size. Ascending aorta 2.9 cm. No calcifications. No
dissection.

Aortic Valve:  Trileaflet.  No calcifications.

Coronary Arteries:  Normal coronary origin.  Right dominance.

RCA is a large dominant artery that gives rise to PDA and two SULY
branches. There is no plaque.

Left main is a large artery that gives rise to LAD and LCX arteries.

LAD is a large vessel that has no plaque. There is a small D1, large
D2 and small D3 that are all without plaque.

LCX is a non-dominant artery with large OM1, large OM2 and small
OM3. There is no plaque.

Other findings:

Three right sided pulmonary veins.  Two left sided pulmonary veins.

Normal let atrial appendage without a thrombus.

Normal size of the pulmonary artery.
IMPRESSION: 1. Coronary calcium score of 0. This was 0 percentile for age and
sex matched control.

2. Normal coronary origin with right dominance.

3. No evidence of CAD.

*** End of Addendum ***
EXAM:
OVER-READ INTERPRETATION  CT CHEST

The following report is an over-read performed by radiologist Dr.
Kalaitzoglou Taverna [REDACTED] on 01/25/2019. This
over-read does not include interpretation of cardiac or coronary
anatomy or pathology. The coronary calcium score/coronary CTA
interpretation by the cardiologist is attached.
FINDINGS: Within the visualized portions of the thorax there are no suspicious
appearing pulmonary nodules or masses, there is no acute
consolidative airspace disease, no pleural effusions, no
pneumothorax and no lymphadenopathy. Visualized portions of the
upper abdomen are unremarkable. There are no aggressive appearing
lytic or blastic lesions noted in the visualized portions of the
skeleton.
IMPRESSION: No significant incidental noncardiac findings are noted.

## 2020-10-12 ENCOUNTER — Other Ambulatory Visit: Payer: Self-pay

## 2020-11-16 ENCOUNTER — Other Ambulatory Visit: Payer: Self-pay | Admitting: Physician Assistant

## 2020-11-17 NOTE — Telephone Encounter (Signed)
Has apt 6/28. 90 day refills

## 2020-11-24 ENCOUNTER — Ambulatory Visit (INDEPENDENT_AMBULATORY_CARE_PROVIDER_SITE_OTHER): Payer: 59 | Admitting: Physician Assistant

## 2020-11-24 ENCOUNTER — Other Ambulatory Visit: Payer: Self-pay

## 2020-11-24 ENCOUNTER — Encounter: Payer: Self-pay | Admitting: Physician Assistant

## 2020-11-24 DIAGNOSIS — G4700 Insomnia, unspecified: Secondary | ICD-10-CM | POA: Diagnosis not present

## 2020-11-24 DIAGNOSIS — F4323 Adjustment disorder with mixed anxiety and depressed mood: Secondary | ICD-10-CM | POA: Diagnosis not present

## 2020-11-24 MED ORDER — BUPROPION HCL ER (XL) 300 MG PO TB24: ORAL_TABLET | ORAL | 1 refills | Status: DC

## 2020-11-24 MED ORDER — ALPRAZOLAM 0.25 MG PO TABS: 0.2500 mg | ORAL_TABLET | Freq: Two times a day (BID) | ORAL | 5 refills | Status: DC | PRN

## 2020-11-24 NOTE — Progress Notes (Signed)
[facility]  Patient ID: Shelley Figueroa,  MRN: [id number]  PCP: Doreene Nest, NP  Date of Evaluation: 11/24/2020 Time spent:30 minutes  Chief Complaint:  Chief Complaint   Anxiety; Depression; Follow-up      HISTORY/CURRENT STATUS: HPI For routine 6 month med check. Not doing well.   Really stressed.  Has been needing the Xanax much more lately.  It is effective but she knows she is not doing well because she is needing it more often.  Her 47 yo son is a SR and has lost his "fire." Accepted to [employer] last year, but isn't disciplined and there are concerns he may not keep his place on the bandage.  Plus he has tried mushrooms, smoking some pot, and they found out last weekend he was at a party and got drunk.  He has become sort of lackadaisical about colleges.  He wants to go to a school in [place] but she and her husband feel like he is not ready for college much less out-of-state.  She got a promotion at work which is a good thing but is still very stressful.  She starts that this coming Friday.  She knows that she deserves it because she has worked very hard to get where she is, but has self doubts and stresses over that.  She is upset that the [employer] overturned [place] versus [place] [employer] last week.  Says she cannot believe they took women's rights away.  Her mom and dad were both in the medical field but she feels like they caused this because they voted for President Trump twice.  With all of the stressors at the same time, it is overwhelming.  Any one of them would be a little difficult but all at the same time is hard.  She is not having any signs of depression.  Able to enjoy things.  Energy and motivation are good.  Not isolating.  She has been crying more the past week just because of all the stressors happening at once.  No suicidal or homicidal thoughts.  Denies dizziness, syncope, seizures, numbness, tingling, tremor, tics, unsteady gait,  slurred speech, confusion. Denies muscle or joint pain, stiffness, or dystonia.  Individual Medical History/ Review of Systems: Changes? :No    Past medications for mental health diagnoses include: Ambien, Lunesta, Wellbutrin, Xanax  Allergies: Clindamycin, Penicillins, Amoxicillin, Codeine, Contrast media [iodinated diagnostic agents], Lisinopril, Doxycycline, Erythromycin, Iohexol, and Neomycin  Current Medications:  Current Outpatient Medications:    albuterol (PROVENTIL HFA;VENTOLIN HFA) 108 (90 Base) MCG/ACT inhaler, Inhale 2 puffs into the lungs every 6 (six) hours as needed., Disp: , Rfl:    ARNUITY ELLIPTA 100 MCG/ACT AEPB, Inhale 1 puff into the lungs daily. , Disp: , Rfl:    azelastine (ASTELIN) 0.1 % nasal spray, Place into both nostrils 2 (two) times daily as needed. Use in each nostril as directed, Disp: , Rfl:    benzonatate (TESSALON) 100 MG capsule, Take 100 mg by mouth 3 (three) times daily., Disp: , Rfl:    cetirizine (ZYRTEC) 10 MG tablet, Take 10 mg by mouth at bedtime., Disp: , Rfl:    clindamycin-benzoyl peroxide (BENZACLIN) gel, , Disp: , Rfl:    EPINEPHrine 0.3 mg/0.3 mL IJ SOAJ injection, 1 in;ection, Disp: , Rfl:    Eszopiclone 3 MG TABS, TAKE 1 TABLET BY MOUTH AT BEDTIME AS NEEDED, Disp: 90 tablet, Rfl: 0   fluticasone (FLONASE SENSIMIST) 27.5 MCG/SPRAY nasal spray, 2 sprays each nostril, Disp: ,  Rfl:    fluticasone (FLONASE) 50 MCG/ACT nasal spray, Place into both nostrils daily. , Disp: , Rfl:    L-Lysine HCl 1000 MG TABS, Take by mouth as needed. , Disp: , Rfl:    Probiotic Product (PROBIOTIC-10 PO), Take by mouth daily., Disp: , Rfl:    Spacer/Aero-Holding Chambers (OPTICHAMBER DIAMOND) MISC, See admin instructions., Disp: , Rfl:    triamterene-hydrochlorothiazide (MAXZIDE-25) 37.5-25 MG tablet, TAKE 1 TABLET BY MOUTH EVERY DAY, Disp: 90 tablet, Rfl: 3   valACYclovir (VALTREX) 500 MG tablet, Take 1,000 mg by mouth 3 (three) times daily as needed. , Disp: , Rfl:     ALPRAZolam (XANAX) 0.25 MG tablet, Take 1 tablet (0.25 mg total) by mouth 2 (two) times daily as needed for anxiety., Disp: 60 tablet, Rfl: 5   buPROPion (WELLBUTRIN XL) 300 MG 24 hr tablet, TAKE 1 TABLET BY MOUTH EVERY DAY IN THE MORNING, Disp: 90 tablet, Rfl: 1   CLENPIQ 10-3.5-12 MG-GM -GM/160ML SOLN, SMARTSIG:160 Milliliter(s) By Mouth As Directed (Patient not taking: No sig reported), Disp: , Rfl:    fluconazole (DIFLUCAN) 150 MG tablet, Take by mouth. (Patient not taking: No sig reported), Disp: , Rfl:    levonorgestrel (MIRENA, 52 MG,) 20 MCG/24HR IUD, 1 Intra Uterine Device (1 each total) by Intrauterine route once for 1 dose. (Patient taking differently: 1 each by Intrauterine route once. Placed 11/06/18), Disp: 1 each, Rfl: 0   nitrofurantoin, macrocrystal-monohydrate, (MACROBID) 100 MG capsule, Take 100 mg by mouth 2 (two) times daily. (Patient not taking: No sig reported), Disp: , Rfl:  Medication Side Effects: none  Family Medical/ Social History: Changes? Got promotion at work, starting July 1st.  See HPI.  MENTAL HEALTH EXAM:  There were no vitals taken for this visit.There is no height or weight on file to calculate BMI.  General Appearance: Casual, Neat and Well Groomed  Eye Contact:  Good  Speech:  Clear and Coherent  Volume:  Normal  Mood:  Anxious and Hopeless  Affect:  Tearful and Anxious  Thought Process:  Goal Directed and Descriptions of Associations: Intact  Orientation:  Full (Time, Place, and Person)  Thought Content: Logical   Suicidal Thoughts:  No  Homicidal Thoughts:  No  Memory:  WNL  Judgement:  Good  Insight:  Good  Psychomotor Activity:  Normal  Concentration:  Concentration: Good and Attention Span: Good  Recall:  Good  Fund of Knowledge: Good  Language: Good  Assets:  Desire for Improvement  ADL's:  Intact  Cognition: WNL  Prognosis:  Good    DIAGNOSES:    ICD-10-CM   1. Situational mixed anxiety and depressive disorder  F43.23     2.  Insomnia, unspecified type  G47.00        Receiving Psychotherapy: Yes    her therapist is out on maternity leave.  It has been 2 months since she has seen her.   RECOMMENDATIONS:  PDMP was reviewed. I provided 30 minutes of face to face time during this encounter, including time spent before and after the visit in records review, medical decision making, and charting.  We discussed the different options to help with the anxiety.  She is taking the lowest dose of Xanax, and usually only 1/2 pill of that, so I recommend increasing to 1 pill twice a day as needed.  If she does need to take more than that, she should let me know and I will either increase the dose or more likely increase the frequency  that she can take it. Continue Wellbutrin XL 300 mg 1 p.o. daily. Continue Xanax 0.25 mg twice daily as needed.  Continue Lunesta 3 mg nightly as needed. Return in 2 months.  Melony Overly, PA-C

## 2020-12-18 ENCOUNTER — Ambulatory Visit: Payer: 59 | Attending: Internal Medicine

## 2020-12-18 DIAGNOSIS — Z20822 Contact with and (suspected) exposure to covid-19: Secondary | ICD-10-CM

## 2020-12-20 LAB — SARS-COV-2, NAA 2 DAY TAT

## 2020-12-20 LAB — NOVEL CORONAVIRUS, NAA: SARS-CoV-2, NAA: NOT DETECTED

## 2021-01-12 ENCOUNTER — Telehealth: Payer: Self-pay | Admitting: Primary Care

## 2021-01-12 NOTE — Telephone Encounter (Signed)
Shelley Figueroa called in wanted to know about getting back in with [facility].

## 2021-01-13 NOTE — Telephone Encounter (Signed)
Looked at the chart, LOV 04/06/2018, patient is within 3 years of seen Jae Dire so we are still her PCP office. Scheduled appointment on 01/29/21 to discuss referral to rheumatologist and labs .

## 2021-01-13 NOTE — Telephone Encounter (Signed)
Sorry not sure why she need to get back in with kate? Did she need to reschedule or is she having an issue?

## 2021-01-13 NOTE — Telephone Encounter (Signed)
She wanted to know if she can see kate as her pcp again.

## 2021-01-18 ENCOUNTER — Ambulatory Visit: Payer: Self-pay

## 2021-01-26 ENCOUNTER — Ambulatory Visit (INDEPENDENT_AMBULATORY_CARE_PROVIDER_SITE_OTHER): Payer: 59 | Admitting: Physician Assistant

## 2021-01-26 ENCOUNTER — Other Ambulatory Visit: Payer: Self-pay

## 2021-01-26 ENCOUNTER — Encounter: Payer: Self-pay | Admitting: Physician Assistant

## 2021-01-26 DIAGNOSIS — G4700 Insomnia, unspecified: Secondary | ICD-10-CM | POA: Diagnosis not present

## 2021-01-26 DIAGNOSIS — F411 Generalized anxiety disorder: Secondary | ICD-10-CM

## 2021-01-26 DIAGNOSIS — F3342 Major depressive disorder, recurrent, in full remission: Secondary | ICD-10-CM

## 2021-01-26 MED ORDER — ESZOPICLONE 3 MG PO TABS: 3.0000 mg | ORAL_TABLET | Freq: Every evening | ORAL | 1 refills | Status: DC | PRN

## 2021-01-26 NOTE — Progress Notes (Signed)
[facility]  Patient ID: Shelley Figueroa,  MRN: [id number]  PCP: Doreene Nest, NP  Date of Evaluation: 01/26/2021 Time spent:20 minutes  Chief Complaint:  Chief Complaint   Anxiety; Insomnia; Follow-up      HISTORY/CURRENT STATUS: HPI For routine med check.   At the last visit 2 months ago we had her change Xanax from as needed to twice daily routinely.  That has been extremely helpful.  There have been a handful of occasions where she needed 1 during the day but not common.  States she feels a lot better.  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  But those are things that she just has to work through.  Patient denies loss of interest in usual activities and is able to enjoy things.  Denies decreased energy or motivation.  Appetite has not changed.  No extreme sadness, tearfulness, or feelings of hopelessness.  Denies any changes in concentration, making decisions or remembering things.  Sleeps well with the Lunesta.  Denies suicidal or homicidal thoughts.  Denies dizziness, syncope, seizures, numbness, tingling, tremor, tics, unsteady gait, slurred speech, confusion. Denies muscle or joint pain, stiffness, or dystonia.  Individual Medical History/ Review of Systems: Changes? :No    Past medications for mental health diagnoses include: Ambien, Lunesta, Wellbutrin, Xanax  Allergies: Clindamycin, Penicillins, Amoxicillin, Codeine, Contrast media [iodinated diagnostic agents], Lisinopril, Doxycycline, Erythromycin, Iohexol, and Neomycin  Current Medications:  Current Outpatient Medications:    albuterol (PROVENTIL HFA;VENTOLIN HFA) 108 (90 Base) MCG/ACT inhaler, Inhale 2 puffs into the lungs every 6 (six) hours as needed., Disp: , Rfl:    ALPRAZolam (XANAX) 0.25 MG tablet, Take 1 tablet (0.25 mg total) by mouth 2 (two) times daily as needed for anxiety., Disp: 60 tablet, Rfl: 5   ARNUITY ELLIPTA 100  MCG/ACT AEPB, Inhale 1 puff into the lungs daily. , Disp: , Rfl:    azelastine (ASTELIN) 0.1 % nasal spray, Place into both nostrils 2 (two) times daily as needed. Use in each nostril as directed, Disp: , Rfl:    benzonatate (TESSALON) 100 MG capsule, Take 100 mg by mouth 3 (three) times daily., Disp: , Rfl:    buPROPion (WELLBUTRIN XL) 300 MG 24 hr tablet, TAKE 1 TABLET BY MOUTH EVERY DAY IN THE MORNING, Disp: 90 tablet, Rfl: 1   cetirizine (ZYRTEC) 10 MG tablet, Take 10 mg by mouth at bedtime., Disp: , Rfl:    clindamycin-benzoyl peroxide (BENZACLIN) gel, , Disp: , Rfl:    EPINEPHrine 0.3 mg/0.3 mL IJ SOAJ injection, 1 in;ection, Disp: , Rfl:    fluticasone (FLONASE SENSIMIST) 27.5 MCG/SPRAY nasal spray, 2 sprays each nostril, Disp: , Rfl:    fluticasone (FLONASE) 50 MCG/ACT nasal spray, Place into both nostrils daily. , Disp: , Rfl:    L-Lysine HCl 1000 MG TABS, Take by mouth as needed. , Disp: , Rfl:    Probiotic Product (PROBIOTIC-10 PO), Take by mouth daily., Disp: , Rfl:    Spacer/Aero-Holding Chambers (OPTICHAMBER DIAMOND) MISC, See admin instructions., Disp: , Rfl:    triamterene-hydrochlorothiazide (MAXZIDE-25) 37.5-25 MG tablet, TAKE 1 TABLET BY MOUTH EVERY DAY, Disp: 90 tablet, Rfl: 3   valACYclovir (VALTREX) 500 MG tablet, Take 1,000 mg by mouth 3 (three) times daily as needed. , Disp: , Rfl:    CLENPIQ 10-3.5-12 MG-GM -GM/160ML SOLN, SMARTSIG:160 Milliliter(s) By Mouth As Directed (Patient not taking: No sig reported), Disp: , Rfl:    Eszopiclone 3  MG TABS, Take 1 tablet (3 mg total) by mouth at bedtime as needed. Take immediately before bedtime, Disp: 90 tablet, Rfl: 1   fluconazole (DIFLUCAN) 150 MG tablet, Take by mouth. (Patient not taking: No sig reported), Disp: , Rfl:    levonorgestrel (MIRENA, 52 MG,) 20 MCG/24HR IUD, 1 Intra Uterine Device (1 each total) by Intrauterine route once for 1 dose. (Patient taking differently: 1 each by Intrauterine route once. Placed 11/06/18), Disp:  1 each, Rfl: 0   nitrofurantoin, macrocrystal-monohydrate, (MACROBID) 100 MG capsule, Take 100 mg by mouth 2 (two) times daily., Disp: , Rfl:  Medication Side Effects: none  Family Medical/ Social History: Changes?  No  MENTAL HEALTH EXAM:  There were no vitals taken for this visit.There is no height or weight on file to calculate BMI.  General Appearance: Casual, Neat and Well Groomed  Eye Contact:  Good  Speech:  Clear and Coherent  Volume:  Normal  Mood:  Euthymic  Affect:  Appropriate  Thought Process:  Goal Directed and Descriptions of Associations: Intact  Orientation:  Full (Time, Place, and Person)  Thought Content: Logical   Suicidal Thoughts:  No  Homicidal Thoughts:  No  Memory:  WNL  Judgement:  Good  Insight:  Good  Psychomotor Activity:  Normal  Concentration:  Concentration: Good and Attention Span: Good  Recall:  Good  Fund of Knowledge: Good  Language: Good  Assets:  Desire for Improvement  ADL's:  Intact  Cognition: WNL  Prognosis:  Good    DIAGNOSES:    ICD-10-CM   1. Insomnia, unspecified type  G47.00     2. Recurrent major depressive disorder, in full remission (HCC)  F33.42     3. Generalized anxiety disorder  F41.1        Receiving Psychotherapy: Yes      RECOMMENDATIONS:  PDMP was reviewed.  Last Xanax was filled 01/16/2021.  Last Lunesta filled 11/19/2020. I provided 20 minutes of face to face time during this encounter, including time spent before and after the visit in records review, medical decision making, and charting.  I am glad to see her doing better.  No changes in meds are necessary. Continue Wellbutrin XL 300 mg 1 p.o. daily. Continue Xanax 0.25 mg twice daily as needed.  (Takes routinely now.  Still effective.) Continue Lunesta 3 mg nightly as needed. Return in 6 months.  Melony Overly, PA-C

## 2021-01-29 ENCOUNTER — Ambulatory Visit (INDEPENDENT_AMBULATORY_CARE_PROVIDER_SITE_OTHER): Payer: 59 | Admitting: Primary Care

## 2021-01-29 ENCOUNTER — Encounter: Payer: Self-pay | Admitting: Primary Care

## 2021-01-29 ENCOUNTER — Other Ambulatory Visit: Payer: Self-pay

## 2021-01-29 VITALS — BP 120/82 | HR 86 | Temp 97.6°F | Ht 66.0 in | Wt 149.0 lb

## 2021-01-29 DIAGNOSIS — M25541 Pain in joints of right hand: Secondary | ICD-10-CM

## 2021-01-29 DIAGNOSIS — M25542 Pain in joints of left hand: Secondary | ICD-10-CM | POA: Diagnosis not present

## 2021-01-29 DIAGNOSIS — F4323 Adjustment disorder with mixed anxiety and depressed mood: Secondary | ICD-10-CM

## 2021-01-29 DIAGNOSIS — E785 Hyperlipidemia, unspecified: Secondary | ICD-10-CM

## 2021-01-29 DIAGNOSIS — J309 Allergic rhinitis, unspecified: Secondary | ICD-10-CM

## 2021-01-29 DIAGNOSIS — Z8249 Family history of ischemic heart disease and other diseases of the circulatory system: Secondary | ICD-10-CM

## 2021-01-29 DIAGNOSIS — I10 Essential (primary) hypertension: Secondary | ICD-10-CM | POA: Diagnosis not present

## 2021-01-29 DIAGNOSIS — F3342 Major depressive disorder, recurrent, in full remission: Secondary | ICD-10-CM

## 2021-01-29 DIAGNOSIS — J4520 Mild intermittent asthma, uncomplicated: Secondary | ICD-10-CM | POA: Diagnosis not present

## 2021-01-29 DIAGNOSIS — M2540 Effusion, unspecified joint: Secondary | ICD-10-CM | POA: Diagnosis not present

## 2021-01-29 DIAGNOSIS — G4700 Insomnia, unspecified: Secondary | ICD-10-CM

## 2021-01-29 LAB — C-REACTIVE PROTEIN: CRP: <1 mg/dL (ref 0.5–20.0)

## 2021-01-29 LAB — URIC ACID: Uric Acid, Serum: 5.8 mg/dL (ref 2.4–7.0)

## 2021-01-29 LAB — SEDIMENTATION RATE: Sed Rate: 6 mm/hr (ref 0–20)

## 2021-01-29 NOTE — Assessment & Plan Note (Signed)
Following with cardiology, prior office notes and imaging reviewed.  Continue BP control

## 2021-01-29 NOTE — Assessment & Plan Note (Signed)
Follows with psychiatry, feels well controlled. Continue bupropion XL 300 mg, Lunesta 3 mg, and alprazolam 0.25 mg BID.

## 2021-01-29 NOTE — Progress Notes (Signed)
Subjective:    Patient ID: [name], female    DOB: 08/18/1975, 45 y.o.   MRN: [id number]  HPI  LAJUAN GODBEE is a very pleasant 45 y.o. female with a history of hypertension, asthma, GERD, anxiety and depression, hyperlipidemia who presents today for follow up of chronic conditions and for joint pain.  1) Essential Hypertension: Following with cardiology for hypertension and family history of heart disease. Last visit was nearly one year ago.   Family history of heart attack in father in his early 87's with multiple stents placed. She underwent stress echo in 2015 which was negative and echocardiogram in 2020 which was unremarkable. Coronary CT completed which showed no CAD.  Managed on triamterene-HCTZ 37.5-25 mg for hypertension.  BP Readings from Last 3 Encounters:  01/29/21 120/82  02/14/20 122/74  12/19/19 128/80   2) Anxiety/Depression/Insomnia: Currently managed on bupropion XL 300 mg, Lunesta 3 mg, alprazolam 0.25 mg BID. Following with psychiatry, last visit was 01/26/21 for routine follow up. No changes made to her regimen.   Today she endorses she is doing well on her regimen, has no concerns.   3) Asthma/Allergic Rhinitis: Currently managed on Arnuity Ellipta 100 mcg daily, Astelin nasal spray, albuterol inhaler PRN. Following with pulmonology. Infrequent use of albuterol. She is also receiving allergy injections weekly, unsure the name.   4) Joint Pain: Strong family history of RA in both her mother and sister. Around 2016 or 2017 she developed generalized joint pain, she decided to stop eating gluten and her joint pain abated for several years.   In early 2020 she began to notice left 5th digit swelling and bruising with numbness and pain to her left upper extremity. She saw a hand surgeon at the time, negative EMG testing and other work up. Symptoms completely resolved after she stopped traveling via airplane, until about three months ago.  Her symptoms  begin at the left upper arm with tingling and pain, radiation to left elbow and down to her left 5th digit. Her left 5th digit will swell quickly, has to remove any rings.  She will take anti-inflammatory medication with temproary improvement.   She continues to notice joint aches to her left knee, history of knee surgery in her 20's. History of plantar fasciitis. Chronic joint pain to the hands. She's undergone RA work up in 2020, all negative.    Review of Systems  Respiratory:  Negative for shortness of breath and wheezing.   Musculoskeletal:  Positive for arthralgias and joint swelling.  Skin:  Positive for color change.  Allergic/Immunologic: Positive for environmental allergies.  Psychiatric/Behavioral:  The patient is not nervous/anxious.         Past Medical History:  Diagnosis Date   Allergic rhinitis due to pollen    Chronic cystitis    Depression    Dyspareunia in female    Dyspnea on exertion    per pt hard to recover after exercise (cardiologist has echo and cardiac CT ordered when schedule is opened up)   Essential hypertension    cardioloigst-  dr t. [facility]-- FH premature CAD--- 08-18-2014 normal stress echo   History of chronic bronchitis    History of HPV infection    remote   History of kidney stones    History of recurrent UTIs    Pure hypercholesterolemia 09/20/2018   Renal calculus, left    per pt non-obstructive    Social History   Socioeconomic History   Marital status: Married  Spouse name: Mardelle Matte   Number of children: 2   Years of education: Not on file   Highest education level: Not on file  Occupational History   Occupation: Executive    Comment: [employer]  Tobacco Use   Smoking status: Never   Smokeless tobacco: Never  Vaping Use   Vaping Use: Never used  Substance and Sexual Activity   Alcohol use: Yes    Alcohol/week: 0.0 - 1.0 standard drinks    Comment: very rare   Drug use: Never   Sexual activity: Yes    Partners: Male    Birth  control/protection: I.U.D.    Comment: Mirena placed 11/06/18  Other Topics Concern   Not on file  Social History Narrative   From [place] originally   [employer], [employer] of [employer]   Married to [employer]   2 children   Social Determinants of Health   Financial Resource Strain: Not on file  Food Insecurity: Not on file  Transportation Needs: Not on file  Physical Activity: Not on file  Stress: Not on file  Social Connections: Not on file  Intimate Partner Violence: Not on file    Past Surgical History:  Procedure Laterality Date   CESAREAN SECTION  2004, 2005   CYSTOSCOPY N/A 11/06/2018   Procedure: CYSTOSCOPY;  Surgeon: Jerene Bears, MD;  Location: [facility];  Service: Gynecology;  Laterality: N/A;   INTRAUTERINE DEVICE (IUD) INSERTION N/A 11/06/2018   Procedure: INTRAUTERINE DEVICE (IUD) INSERTION WITH ULTRASOUND GUIDANCE;  Surgeon: Jerene Bears, MD;  Location: [facility];  Service: Gynecology;  Laterality: N/A;   IUD REMOVAL N/A 11/06/2018   Procedure: INTRAUTERINE DEVICE (IUD) REMOVAL;  Surgeon: Jerene Bears, MD;  Location: [facility];  Service: Gynecology;  Laterality: N/A;  Mirena IUD removal and reinsertion with ultrasound guidance.   KNEE ARTHROSCOPY Right 1995   with left knee realignment   KNEE SURGERY Left 1991   NASAL SINUS SURGERY  2015   wtih septoplasty   SEPTOPLASTY     TONSILLECTOMY  1987    Family History  Problem Relation Age of Onset   Hypertension Mother    Hyperlipidemia Mother    Rheum arthritis Mother    Sjogren's syndrome Mother    Hypertension Father    Hyperlipidemia Father    Coronary artery disease Father        MI 49s, smoked, CABG, MULTIPLE STENTS   Colon cancer Maternal Grandfather    Bipolar disorder Sister    Alcohol abuse Sister    Depression Sister    Rheum arthritis Sister    Obesity Sister    Parkinson's disease Maternal Grandmother    Alzheimer's disease Paternal  Grandmother     Allergies  Allergen Reactions   Clindamycin Nausea And Vomiting   Penicillins Hives and Shortness Of Breath   Amoxicillin Rash   Codeine Itching   Contrast Media [Iodinated Diagnostic Agents] Rash    Rash after cardiac CT   Lisinopril Other (See Comments)    cough   Doxycycline Nausea And Vomiting    Not a true allergy   Erythromycin Nausea And Vomiting   Iohexol Rash   Neomycin Nausea And Vomiting    Current Outpatient Medications on File Prior to Visit  Medication Sig Dispense Refill   albuterol (PROVENTIL HFA;VENTOLIN HFA) 108 (90 Base) MCG/ACT inhaler Inhale 2 puffs into the lungs every 6 (six) hours as needed.     ALPRAZolam (XANAX) 0.25 MG  tablet Take 1 tablet (0.25 mg total) by mouth 2 (two) times daily as needed for anxiety. 60 tablet 5   ARNUITY ELLIPTA 100 MCG/ACT AEPB Inhale 1 puff into the lungs daily.      azelastine (ASTELIN) 0.1 % nasal spray Place into both nostrils 2 (two) times daily as needed. Use in each nostril as directed     buPROPion (WELLBUTRIN XL) 300 MG 24 hr tablet TAKE 1 TABLET BY MOUTH EVERY DAY IN THE MORNING 90 tablet 1   cetirizine (ZYRTEC) 10 MG tablet Take 10 mg by mouth at bedtime.     clindamycin-benzoyl peroxide (BENZACLIN) gel      EPINEPHrine 0.3 mg/0.3 mL IJ SOAJ injection 1 in;ection     Eszopiclone 3 MG TABS Take 1 tablet (3 mg total) by mouth at bedtime as needed. Take immediately before bedtime 90 tablet 1   fluticasone (FLONASE SENSIMIST) 27.5 MCG/SPRAY nasal spray 2 sprays each nostril     L-Lysine HCl 1000 MG TABS Take by mouth as needed.      Probiotic Product (PROBIOTIC-10 PO) Take by mouth daily.     Spacer/Aero-Holding Chambers [facility] DIAMOND) MISC See admin instructions.     triamterene-hydrochlorothiazide (MAXZIDE-25) 37.5-25 MG tablet TAKE 1 TABLET BY MOUTH EVERY DAY 90 tablet 3   valACYclovir (VALTREX) 500 MG tablet Take 1,000 mg by mouth 3 (three) times daily as needed.      No current  facility-administered medications on file prior to visit.    BP 120/82   Pulse 86   Temp 97.6 F (36.4 C) (Temporal)   Ht 5\' 6"  (1.676 m)   Wt 149 lb (67.6 kg)   SpO2 98%   BMI 24.05 kg/m  Objective:   Physical Exam Cardiovascular:     Rate and Rhythm: Normal rate and regular rhythm.  Pulmonary:     Effort: Pulmonary effort is normal.     Breath sounds: Normal breath sounds.  Musculoskeletal:     Cervical back: Neck supple.     Comments: Normal ROM to bilateral hands without joint swelling noted. She does have evidence of heberden's nodes on right 2nd or 3rd DIP joint.   Skin:    General: Skin is warm and dry.     Findings: No erythema.  Psychiatric:        Mood and Affect: Mood normal.          Assessment & Plan:      This visit occurred during the SARS-CoV-2 public health emergency.  Safety protocols were in place, including screening questions prior to the visit, additional usage of staff PPE, and extensive cleaning of exam room while observing appropriate contact time as indicated for disinfecting solutions.

## 2021-01-29 NOTE — Assessment & Plan Note (Signed)
Well controlled in the office today. Follows with cardiology, notes from September 2021 reviewed.  Continue triamterene-HCTZ 37.5-25 mg.  Prior imaging/testing reviewed.

## 2021-01-29 NOTE — Patient Instructions (Signed)
Stop by the lab prior to leaving today. I will notify you of your results once received.   You will be contacted regarding your referral to rheumatology.  Please let us know if you have not been contacted within two weeks.   It was a pleasure to see you today!   

## 2021-01-29 NOTE — Assessment & Plan Note (Signed)
Following with cardiology. Last office notes reviewed.

## 2021-01-29 NOTE — Assessment & Plan Note (Signed)
Follows with psychiatry, feels well controlled. Continue bupropion XL 300 mg, Lunesta 3 mg, and alprazolam 0.25 mg BID. 

## 2021-01-29 NOTE — Assessment & Plan Note (Signed)
Chronic and intermittent for years, more consistent over last 3 months. Strong family history of RA in mom and sister.  Checking labs including CRP, RF, ANA, CCP, uric acid. Referral placed to rheumatology.

## 2021-01-29 NOTE — Assessment & Plan Note (Signed)
Well controlled on Arnuity Ellipta 100 mcg, infrequent use of albuterol inhaler. Follows with pulmonology.  Continue current regimen.

## 2021-01-29 NOTE — Assessment & Plan Note (Signed)
Well controlled with Astelin nasal spray and asthma regimen, continue same.

## 2021-02-02 LAB — ANA: Anti Nuclear Antibody (ANA): NEGATIVE

## 2021-02-02 LAB — RHEUMATOID FACTOR: Rheumatoid fact SerPl-aCnc: <14 IU/mL (ref <14)

## 2021-02-02 LAB — CYCLIC CITRUL PEPTIDE ANTIBODY, IGG: Cyclic Citrullin Peptide Ab: <16 UNITS

## 2021-02-25 ENCOUNTER — Other Ambulatory Visit: Payer: Self-pay | Admitting: Obstetrics & Gynecology

## 2021-02-25 DIAGNOSIS — Z1231 Encounter for screening mammogram for malignant neoplasm of breast: Secondary | ICD-10-CM

## 2021-03-24 ENCOUNTER — Ambulatory Visit
Admission: RE | Admit: 2021-03-24 | Discharge: 2021-03-24 | Disposition: A | Discharge status: Home / Self Care | Payer: 59 | Source: Ambulatory Visit

## 2021-03-24 ENCOUNTER — Other Ambulatory Visit: Payer: Self-pay

## 2021-03-24 DIAGNOSIS — Z1231 Encounter for screening mammogram for malignant neoplasm of breast: Secondary | ICD-10-CM

## 2021-03-30 ENCOUNTER — Ambulatory Visit (HOSPITAL_BASED_OUTPATIENT_CLINIC_OR_DEPARTMENT_OTHER): Payer: 59 | Admitting: Obstetrics & Gynecology

## 2021-03-31 ENCOUNTER — Encounter (HOSPITAL_BASED_OUTPATIENT_CLINIC_OR_DEPARTMENT_OTHER): Payer: Self-pay | Admitting: Obstetrics & Gynecology

## 2021-03-31 ENCOUNTER — Ambulatory Visit (INDEPENDENT_AMBULATORY_CARE_PROVIDER_SITE_OTHER): Payer: 59 | Admitting: Obstetrics & Gynecology

## 2021-03-31 ENCOUNTER — Other Ambulatory Visit (HOSPITAL_COMMUNITY)
Admission: RE | Admit: 2021-03-31 | Discharge: 2021-03-31 | Disposition: A | Discharge status: Home / Self Care | Payer: 59 | Source: Ambulatory Visit | Attending: Obstetrics & Gynecology | Admitting: Obstetrics & Gynecology

## 2021-03-31 ENCOUNTER — Other Ambulatory Visit: Payer: Self-pay

## 2021-03-31 VITALS — BP 142/77 | HR 86 | Ht 66.0 in | Wt 151.8 lb

## 2021-03-31 DIAGNOSIS — N941 Unspecified dyspareunia: Secondary | ICD-10-CM

## 2021-03-31 DIAGNOSIS — Z124 Encounter for screening for malignant neoplasm of cervix: Secondary | ICD-10-CM | POA: Diagnosis present

## 2021-03-31 DIAGNOSIS — Z975 Presence of (intrauterine) contraceptive device: Secondary | ICD-10-CM | POA: Diagnosis not present

## 2021-03-31 DIAGNOSIS — N926 Irregular menstruation, unspecified: Secondary | ICD-10-CM

## 2021-03-31 DIAGNOSIS — Z01419 Encounter for gynecological examination (general) (routine) without abnormal findings: Secondary | ICD-10-CM | POA: Diagnosis not present

## 2021-03-31 NOTE — Progress Notes (Signed)
45 y.o. G76P1102 Married White or Caucasian female here for annual exam.  Has Mirena IUD and has irregular cycles.  Was placed 10/2018.  Can skip up to 60 days between cycles.  When has bleeding, the bleeding is irregular as well.  Bleeding can be heavy but can also be just spotting.  Flow, overall, is better.    No LMP recorded. (Menstrual status: IUD).          Sexually active: No.  The current method of family planning is IUD.    Exercising: "sort of" Smoker:  no  Health Maintenance: Pap:  2019 neg with neg HR HPV History of abnormal Pap:  yes MMG:  03/24/2021 Negative Colonoscopy:  02/21/2020, follow up 10 years Screening Labs: 10/2019   reports that she has never smoked. She has never used smokeless tobacco. She reports current alcohol use. She reports that she does not use drugs.  Past Medical History:  Diagnosis Date   Allergic rhinitis due to pollen    Chronic cystitis    Depression    Dyspareunia in female    Dyspnea on exertion    per pt hard to recover after exercise (cardiologist has echo and cardiac CT ordered when schedule is opened up)   Essential hypertension    cardioloigst-  dr t. [facility]-- FH premature CAD--- 08-18-2014 normal stress echo   History of chronic bronchitis    History of HPV infection    remote   History of kidney stones    History of recurrent UTIs    Pure hypercholesterolemia 09/20/2018   Renal calculus, left    per pt non-obstructive    Past Surgical History:  Procedure Laterality Date   CESAREAN SECTION  2004, 2005   CYSTOSCOPY N/A 11/06/2018   Procedure: CYSTOSCOPY;  Surgeon: Jerene Bears, MD;  Location: [facility];  Service: Gynecology;  Laterality: N/A;   INTRAUTERINE DEVICE (IUD) INSERTION N/A 11/06/2018   Procedure: INTRAUTERINE DEVICE (IUD) INSERTION WITH ULTRASOUND GUIDANCE;  Surgeon: Jerene Bears, MD;  Location: [facility];  Service: Gynecology;  Laterality: N/A;   IUD REMOVAL N/A 11/06/2018    Procedure: INTRAUTERINE DEVICE (IUD) REMOVAL;  Surgeon: Jerene Bears, MD;  Location: [facility];  Service: Gynecology;  Laterality: N/A;  Mirena IUD removal and reinsertion with ultrasound guidance.   KNEE ARTHROSCOPY Right 1995   with left knee realignment   KNEE SURGERY Left 1991   NASAL SINUS SURGERY  2015   wtih septoplasty   SEPTOPLASTY     TONSILLECTOMY  1987    Current Outpatient Medications  Medication Sig Dispense Refill   albuterol (PROVENTIL HFA;VENTOLIN HFA) 108 (90 Base) MCG/ACT inhaler Inhale 2 puffs into the lungs every 6 (six) hours as needed.     ALPRAZolam (XANAX) 0.25 MG tablet Take 1 tablet (0.25 mg total) by mouth 2 (two) times daily as needed for anxiety. 60 tablet 5   ARNUITY ELLIPTA 100 MCG/ACT AEPB Inhale 1 puff into the lungs daily.      azelastine (ASTELIN) 0.1 % nasal spray Place into both nostrils 2 (two) times daily as needed. Use in each nostril as directed     buPROPion (WELLBUTRIN XL) 300 MG 24 hr tablet TAKE 1 TABLET BY MOUTH EVERY DAY IN THE MORNING 90 tablet 1   cetirizine (ZYRTEC) 10 MG tablet Take 10 mg by mouth at bedtime.     clindamycin-benzoyl peroxide (BENZACLIN) gel      EPINEPHrine 0.3 mg/0.3 mL IJ SOAJ  injection 1 in;ection     Eszopiclone 3 MG TABS Take 1 tablet (3 mg total) by mouth at bedtime as needed. Take immediately before bedtime 90 tablet 1   fluticasone (FLONASE SENSIMIST) 27.5 MCG/SPRAY nasal spray 2 sprays each nostril     L-Lysine HCl 1000 MG TABS Take by mouth as needed.      Probiotic Product (PROBIOTIC-10 PO) Take by mouth daily.     Spacer/Aero-Holding Chambers [facility] DIAMOND) MISC See admin instructions.     triamterene-hydrochlorothiazide (MAXZIDE-25) 37.5-25 MG tablet TAKE 1 TABLET BY MOUTH EVERY DAY 90 tablet 3   valACYclovir (VALTREX) 500 MG tablet Take 1,000 mg by mouth 3 (three) times daily as needed.      No current facility-administered medications for this visit.    Family History  Problem  Relation Age of Onset   Hypertension Mother    Hyperlipidemia Mother    Rheum arthritis Mother    Sjogren's syndrome Mother    Hypertension Father    Hyperlipidemia Father    Coronary artery disease Father        MI 25s, smoked, CABG, MULTIPLE STENTS   Colon cancer Maternal Grandfather    Bipolar disorder Sister    Alcohol abuse Sister    Depression Sister    Rheum arthritis Sister    Obesity Sister    Parkinson's disease Maternal Grandmother    Alzheimer's disease Paternal Grandmother     Review of Systems  Genitourinary:  Positive for vaginal bleeding (irregular).   Exam:   BP (!) 142/77 (BP Location: Right Arm, Patient Position: Sitting, Cuff Size: Large)   Pulse 86   Ht 5\' 6"  (1.676 m)   Wt 151 lb 12.8 oz (68.9 kg)   BMI 24.50 kg/m   Height: 5\' 6"  (167.6 cm)  General appearance: alert, cooperative and appears stated age Head: Normocephalic, without obvious abnormality, atraumatic Neck: no adenopathy, supple, symmetrical, trachea midline and thyroid normal to inspection and palpation Lungs: clear to auscultation bilaterally Breasts: normal appearance, no masses or tenderness Heart: regular rate and rhythm Abdomen: soft, non-tender; bowel sounds normal; no masses,  no organomegaly Extremities: extremities normal, atraumatic, no cyanosis or edema Skin: Skin color, texture, turgor normal. No rashes or lesions Lymph nodes: Cervical, supraclavicular, and axillary nodes normal. No abnormal inguinal nodes palpated Neurologic: Grossly normal   Pelvic: External genitalia:  no lesions              Urethra:  normal appearing urethra with no masses, tenderness or lesions              Bartholins and Skenes: normal                 Vagina: normal appearing vagina with normal color and no discharge, no lesions              Cervix: no lesions, IUD string noted              Pap taken: Yes.   Bimanual Exam:  Uterus:  normal size, contour, position, consistency, mobility,  non-tender              Adnexa: normal adnexa and no mass, fullness, tenderness               Rectovaginal: Confirms               Anus:  deferred  Chaperone, , CMA, was present for exam.  Assessment/Plan: 1. Well woman exam with routine gynecological exam - pap  and HR HPV obtained today - MMG up to date - colon cancer 01/2020 - lab work reviewed in Epic - Care Gaps reviewed/updated  2. Cervical cancer screening - Cytology - PAP( [facility])  3. Irregular bleeding - due to IUD  4. IUD (intrauterine device) in place - placed 10/2018  5. Dyspareunia in female

## 2021-04-02 LAB — CYTOLOGY - PAP
Comment: NEGATIVE
Diagnosis: NEGATIVE
High risk HPV: NEGATIVE

## 2021-05-11 ENCOUNTER — Telehealth: Payer: 59 | Admitting: Internal Medicine

## 2021-05-12 ENCOUNTER — Other Ambulatory Visit: Payer: Self-pay

## 2021-05-12 ENCOUNTER — Telehealth (INDEPENDENT_AMBULATORY_CARE_PROVIDER_SITE_OTHER): Payer: 59 | Admitting: Family Medicine

## 2021-05-12 ENCOUNTER — Encounter: Payer: Self-pay | Admitting: Family Medicine

## 2021-05-12 VITALS — BP 145/85 | HR 85 | Temp 98.0°F | Ht 66.0 in | Wt 149.0 lb

## 2021-05-12 DIAGNOSIS — I10 Essential (primary) hypertension: Secondary | ICD-10-CM | POA: Diagnosis not present

## 2021-05-12 DIAGNOSIS — U071 COVID-19: Secondary | ICD-10-CM | POA: Diagnosis not present

## 2021-05-12 HISTORY — DX: COVID-19: U07.1

## 2021-05-12 MED ORDER — BENZONATATE 100 MG PO CAPS: 100.0000 mg | ORAL_CAPSULE | Freq: Three times a day (TID) | ORAL | 1 refills | Status: DC | PRN

## 2021-05-12 MED ORDER — ESZOPICLONE 2 MG PO TABS: 2.0000 mg | ORAL_TABLET | Freq: Every evening | ORAL | 0 refills | Status: DC | PRN

## 2021-05-12 MED ORDER — NIRMATRELVIR/RITONAVIR (PAXLOVID)TABLET: 3.0000 | ORAL_TABLET | Freq: Two times a day (BID) | ORAL | 0 refills | Status: AC

## 2021-05-12 NOTE — Assessment & Plan Note (Addendum)
Reviewed currently approved EUA treatments.  Reviewed expected course of illness, anticipated course of recovery, as well as red flags to suggest COVID pneumonia and/or to seek urgent in-person care. Reviewed latest CDC isolation/quarantine guidelines.  Encouraged fluids and rest. Reviewed further supportive care measures at home including vit C 500mg  bid, vit D 2000 IU daily, zinc 100mg  daily, tylenol PRN, pepcid 20mg  BID PRN.   Recommend:  Full dose paxlovid.  Paxlovid drug interactions:  1. Alprazolam - rec drop dose by 50% while on paxlovid.  2. bupropion - paxlovid may decrease bupropion concentration, monitor therapy.  3. lunesta - max dose should be 2mg  while on paxlovid - 5d supply of 2mg  dose sent to pharmacy.  4. flonase and arnuity - paxlovid may increase dose, monitor therapy.

## 2021-05-12 NOTE — Assessment & Plan Note (Signed)
BP elevated today in setting of acute illness and she forgot to take BP med this morning. Advised continue monitoring BPs and let us know if persistently elevated even when COVID symptoms have improved.

## 2021-05-12 NOTE — Progress Notes (Signed)
Patient ID: Shelley Figueroa, female    DOB: 1975/07/06, 45 y.o.   MRN: [id number]  Virtual visit completed through MyChart, a video enabled telemedicine application. Due to national recommendations of social distancing due to COVID-19, a virtual visit is felt to be most appropriate for this patient at this time. Reviewed limitations, risks, security and privacy concerns of performing a virtual visit and the availability of in person appointments. I also reviewed that there may be a patient responsible charge related to this service. The patient agreed to proceed.   Patient location: home office Provider location: [place] at [facility], office Persons participating in this virtual visit: patient, provider   If any vitals were documented, they were collected by patient at home unless specified below.    BP (!) 145/85    Pulse 85    Temp 98 F (36.7 C)    Ht 5\' 6"  (1.676 m)    Wt 149 lb (67.6 kg)    SpO2 95%    BMI 24.05 kg/m    CC: COVID infection Subjective:   HPI: Shelley Figueroa is a 45 y.o. female presenting on 05/12/2021 for Covid Positive (C/o cough, congestion, fatigue, body aches and "foggy" brain.  Sxs started 05/11/21.  Pos home COVID- 05/11/21.  Latest COVID booster- 03/11/21, flu- 03/11/21.  Tried Tylenol and old rx of benzonatate. )   First day of symptoms: 05/11/2021 Tested COVID positive: 05/11/2021  Current symptoms: mildly productive cough, congestion, fatigue, body aches and mental fog. Cough significantly worse today. Mild nausea and constipation No: fevers/chills, ear or tooth pain, dyspnea, wheezing, abd pain, diarrhea  Treatments to date: tylenol, benzonatate perls - limited options for cough due to limited tolerability of other meds (codeine and dextromethorphan) Risk factors include: HTN on maxzide  Endorses h/o frequent bronchitis, no h/o asthma, no smoking history She does take arnuity ellipta for h/o bronchitis.  Husband initially sick as well,  he recently started paxlovid.   COVID vaccination status: moderna x3, pfizer bivalent booster 02/2021  BP elevated today - forgot BP med this morning     Relevant past medical, surgical, family and social history reviewed and updated as indicated. Interim medical history since our last visit reviewed. Allergies and medications reviewed and updated. Outpatient Medications Prior to Visit  Medication Sig Dispense Refill   albuterol (PROVENTIL HFA;VENTOLIN HFA) 108 (90 Base) MCG/ACT inhaler Inhale 2 puffs into the lungs every 6 (six) hours as needed.     ALPRAZolam (XANAX) 0.25 MG tablet Take 1 tablet (0.25 mg total) by mouth 2 (two) times daily as needed for anxiety. 60 tablet 5   ARNUITY ELLIPTA 100 MCG/ACT AEPB Inhale 1 puff into the lungs daily.      azelastine (ASTELIN) 0.1 % nasal spray Place into both nostrils 2 (two) times daily as needed. Use in each nostril as directed     buPROPion (WELLBUTRIN XL) 300 MG 24 hr tablet TAKE 1 TABLET BY MOUTH EVERY DAY IN THE MORNING 90 tablet 1   cetirizine (ZYRTEC) 10 MG tablet Take 10 mg by mouth at bedtime.     clindamycin-benzoyl peroxide (BENZACLIN) gel      EPINEPHrine 0.3 mg/0.3 mL IJ SOAJ injection 1 in;ection     Eszopiclone 3 MG TABS Take 1 tablet (3 mg total) by mouth at bedtime as needed. Take immediately before bedtime 90 tablet 1   fluticasone (FLONASE SENSIMIST) 27.5 MCG/SPRAY nasal spray 2 sprays each nostril     L-Lysine HCl 1000 MG  TABS Take by mouth as needed.      Probiotic Product (PROBIOTIC-10 PO) Take by mouth daily.     Spacer/Aero-Holding Chambers [facility] DIAMOND) MISC See admin instructions.     triamterene-hydrochlorothiazide (MAXZIDE-25) 37.5-25 MG tablet TAKE 1 TABLET BY MOUTH EVERY DAY 90 tablet 3   valACYclovir (VALTREX) 500 MG tablet Take 1,000 mg by mouth 3 (three) times daily as needed.      No facility-administered medications prior to visit.     Per HPI unless specifically indicated in ROS section  below Review of Systems Objective:  BP (!) 145/85    Pulse 85    Temp 98 F (36.7 C)    Ht 5\' 6"  (1.676 m)    Wt 149 lb (67.6 kg)    SpO2 95%    BMI 24.05 kg/m   Wt Readings from Last 3 Encounters:  05/12/21 149 lb (67.6 kg)  03/31/21 151 lb 12.8 oz (68.9 kg)  01/29/21 149 lb (67.6 kg)       Physical exam: Gen: alert, NAD, not ill appearing Pulm: speaks in complete sentences without increased work of breathing Psych: normal mood, normal thought content      Lab Results  Component Value Date   CREATININE 0.86 11/06/2019   BUN 16 11/06/2019   NA 140 11/06/2019   K 3.9 11/06/2019   CL 99 11/06/2019   CO2 27 11/06/2019  GFR = 81   Assessment & Plan:   Problem List Items Addressed This Visit     Essential hypertension    BP elevated today in setting of acute illness and she forgot to take BP med this morning. Advised continue monitoring BPs and let 01/06/2020 know if persistently elevated even when COVID symptoms have improved.       COVID-19 virus infection - Primary    Reviewed currently approved EUA treatments.  Reviewed expected course of illness, anticipated course of recovery, as well as red flags to suggest COVID pneumonia and/or to seek urgent in-person care. Reviewed latest CDC isolation/quarantine guidelines.  Encouraged fluids and rest. Reviewed further supportive care measures at home including vit C 500mg  bid, vit D 2000 IU daily, zinc 100mg  daily, tylenol PRN, pepcid 20mg  BID PRN.   Recommend:  Full dose paxlovid.  Paxlovid drug interactions:  1. Alprazolam - rec drop dose by 50% while on paxlovid.  2. bupropion - paxlovid may decrease bupropion concentration, monitor therapy.  3. lunesta - max dose should be 2mg  while on paxlovid - 5d supply of 2mg  dose sent to pharmacy.  4. flonase and arnuity - paxlovid may increase dose, monitor therapy.       Relevant Medications   nirmatrelvir/ritonavir EUA (PAXLOVID) 20 x 150 MG & 10 x 100MG  TABS     Meds ordered this  encounter  Medications   benzonatate (TESSALON) 100 MG capsule    Sig: Take 1 capsule (100 mg total) by mouth 3 (three) times daily as needed for cough.    Dispense:  30 capsule    Refill:  1   nirmatrelvir/ritonavir EUA (PAXLOVID) 20 x 150 MG & 10 x 100MG  TABS    Sig: Take 3 tablets by mouth 2 (two) times daily for 5 days. (Take nirmatrelvir 150 mg two tablets twice daily for 5 days and ritonavir 100 mg one tablet twice daily for 5 days) Patient GFR is 81    Dispense:  30 tablet    Refill:  0   eszopiclone (LUNESTA) 2 MG TABS tablet  Sig: Take 1 tablet (2 mg total) by mouth at bedtime as needed for sleep. Take immediately before bedtime    Dispense:  5 tablet    Refill:  0    No orders of the defined types were placed in this encounter.   I discussed the assessment and treatment plan with the patient. The patient was provided an opportunity to ask questions and all were answered. The patient agreed with the plan and demonstrated an understanding of the instructions. The patient was advised to call back or seek an in-person evaluation if the symptoms worsen or if the condition fails to improve as anticipated.  Follow up plan: No follow-ups on file.  Eustaquio Boyden, MD

## 2021-05-29 ENCOUNTER — Other Ambulatory Visit: Payer: Self-pay | Admitting: Physician Assistant

## 2021-06-30 ENCOUNTER — Other Ambulatory Visit: Payer: Self-pay | Admitting: Physician Assistant

## 2021-07-27 ENCOUNTER — Other Ambulatory Visit: Payer: Self-pay

## 2021-07-27 ENCOUNTER — Ambulatory Visit (INDEPENDENT_AMBULATORY_CARE_PROVIDER_SITE_OTHER): Payer: 59 | Admitting: Physician Assistant

## 2021-07-27 ENCOUNTER — Encounter: Payer: Self-pay | Admitting: Physician Assistant

## 2021-07-27 DIAGNOSIS — G4700 Insomnia, unspecified: Secondary | ICD-10-CM

## 2021-07-27 DIAGNOSIS — F3342 Major depressive disorder, recurrent, in full remission: Secondary | ICD-10-CM

## 2021-07-27 DIAGNOSIS — F411 Generalized anxiety disorder: Secondary | ICD-10-CM

## 2021-07-27 MED ORDER — BUPROPION HCL ER (XL) 300 MG PO TB24: 300.0000 mg | ORAL_TABLET | Freq: Every day | ORAL | 1 refills | Status: DC

## 2021-07-27 MED ORDER — ESZOPICLONE 3 MG PO TABS: 3.0000 mg | ORAL_TABLET | Freq: Every evening | ORAL | 1 refills | Status: DC | PRN

## 2021-07-27 MED ORDER — ALPRAZOLAM 0.25 MG PO TABS: 0.1250 mg | ORAL_TABLET | Freq: Two times a day (BID) | ORAL | 1 refills | Status: DC | PRN

## 2021-07-27 NOTE — Progress Notes (Signed)
[facility] Med Check  Patient ID: Shelley Figueroa,  MRN: [id number]  PCP: Doreene Nest, NP  Date of Evaluation: 07/27/2021 Time spent:20 minutes  Chief Complaint:  Chief Complaint   Anxiety; Depression; Insomnia; Follow-up      HISTORY/CURRENT STATUS: HPI For routine med check.   Doing well with mental health meds. She tried going off the xanax for a few days after her husband watched some sort of documentary and got nervous about her being on it.  After about 3 days she started having shortness of breath again and just feeling generally anxious so she went back on it.  She is usually only taking 0.125 mg twice a day and it is helpful.  No panic attacks.  Work is going really well.  Sleeps well most of the time, does have to take the Lunesta almost nightly though.  Patient denies loss of interest in usual activities and is able to enjoy things.  Denies decreased energy or motivation.  Appetite has not changed.  No extreme sadness, tearfulness, or feelings of hopelessness.  Denies any changes in concentration, making decisions or remembering things.  Denies suicidal or homicidal thoughts.  Denies dizziness, syncope, seizures, numbness, tingling, tremor, tics, unsteady gait, slurred speech, confusion. Denies muscle or joint pain, stiffness, or dystonia.  Individual Medical History/ Review of Systems: Changes? :Yes    Had bronchitis in October that never went away, then had covid in December. Is seeing a functional medicine dr now. She's on a strict diet and using different types of supplements that change at times.   Past medications for mental health diagnoses include: Ambien, Lunesta, Wellbutrin, Xanax  Allergies: Clindamycin, Penicillins, Amoxicillin, Codeine, Contrast media [iodinated contrast media], Lisinopril, Doxycycline, Erythromycin, Iohexol, and Neomycin  Current Medications:  Current Outpatient Medications:    albuterol (PROVENTIL HFA;VENTOLIN HFA) 108 (90  Base) MCG/ACT inhaler, Inhale 2 puffs into the lungs every 6 (six) hours as needed., Disp: , Rfl:    azelastine (ASTELIN) 0.1 % nasal spray, Place into both nostrils 2 (two) times daily as needed. Use in each nostril as directed, Disp: , Rfl:    cetirizine (ZYRTEC) 10 MG tablet, Take 10 mg by mouth at bedtime., Disp: , Rfl:    clindamycin-benzoyl peroxide (BENZACLIN) gel, , Disp: , Rfl:    EPINEPHrine 0.3 mg/0.3 mL IJ SOAJ injection, 1 in;ection, Disp: , Rfl:    Spacer/Aero-Holding Chambers ([employer]ICH[employer] DIAMOND) MISC, See admin instructions., Disp: , Rfl:    triamterene-hydrochlorothiazide (MAXZIDE-25) 37.5-25 MG tablet, TAKE 1 TABLET BY MOUTH EVERY DAY, Disp: 90 tablet, Rfl: 3   UNABLE TO FIND, Med Name: different supplements from functional medicine dr, they change from month to month., Disp: , Rfl:    valACYclovir (VALTREX) 500 MG tablet, Take 1,000 mg by mouth 3 (three) times daily as needed. , Disp: , Rfl:    ALPRAZolam (XANAX) 0.25 MG tablet, Take 0.5-1 tablets (0.125-0.25 mg total) by mouth 2 (two) times daily as needed for anxiety., Disp: 180 tablet, Rfl: 1   ARNUITY ELLIPTA 100 MCG/ACT AEPB, Inhale 1 puff into the lungs daily.  (Patient not taking: Reported on 07/27/2021), Disp: , Rfl:    benzonatate (TESSALON) 100 MG capsule, Take 1 capsule (100 mg total) by mouth 3 (three) times daily as needed for cough. (Patient not taking: Reported on 07/27/2021), Disp: 30 capsule, Rfl: 1   buPROPion (WELLBUTRIN XL) 300 MG 24 hr tablet, Take 1 tablet (300 mg total) by mouth daily., Disp: 90 tablet, Rfl: 1   Eszopiclone  3 MG TABS, Take 1 tablet (3 mg total) by mouth at bedtime as needed. Take immediately before bedtime, Disp: 90 tablet, Rfl: 1   fluticasone (FLONASE SENSIMIST) 27.5 MCG/SPRAY nasal spray, 2 sprays each nostril (Patient not taking: Reported on 07/27/2021), Disp: , Rfl:    L-Lysine HCl 1000 MG TABS, Take by mouth as needed.  (Patient not taking: Reported on 07/27/2021), Disp: , Rfl:     Probiotic Product (PROBIOTIC-10 PO), Take by mouth daily. (Patient not taking: Reported on 07/27/2021), Disp: , Rfl:  Medication Side Effects: none  Family Medical/ Social History: Changes?  No  MENTAL HEALTH EXAM:  There were no vitals taken for this visit.There is no height or weight on file to calculate BMI.  General Appearance: Casual, Neat and Well Groomed  Eye Contact:  Good  Speech:  Clear and Coherent  Volume:  Normal  Mood:  Euthymic  Affect:  Appropriate  Thought Process:  Goal Directed and Descriptions of Associations: Circumstantial  Orientation:  Full (Time, Place, and Person)  Thought Content: Logical   Suicidal Thoughts:  No  Homicidal Thoughts:  No  Memory:  WNL  Judgement:  Good  Insight:  Good  Psychomotor Activity:  Normal  Concentration:  Concentration: Good and Attention Span: Good  Recall:  Good  Fund of Knowledge: Good  Language: Good  Assets:  Desire for Improvement  ADL's:  Intact  Cognition: WNL  Prognosis:  Good    DIAGNOSES:    ICD-10-CM   1. Recurrent major depressive disorder, in full remission (HCC)  F33.42     2. Generalized anxiety disorder  F41.1     3. Insomnia, unspecified type  G47.00        Receiving Psychotherapy: No      RECOMMENDATIONS:  PDMP was reviewed.  Last Xanax was filled 07/01/2021.  Last Lunesta filled 05/18/21. I provided 20 minutes of face to face time during this encounter, including time spent before and after the visit in records review, medical decision making, counseling pertinent to today's visit, and charting.  She is doing really well so no changes in meds are needed.  Continue Wellbutrin XL 300 mg 1 p.o. daily. Continue Xanax 0.25 mg, 1/2-1 twice daily. Continue Lunesta 3 mg nightly as needed. Return in 6 months.  Melony Overly, PA-C

## 2021-08-02 ENCOUNTER — Other Ambulatory Visit: Payer: Self-pay | Admitting: Cardiovascular Disease

## 2021-08-05 NOTE — Telephone Encounter (Signed)
Rx(s) sent to pharmacy electronically.  

## 2021-09-04 ENCOUNTER — Other Ambulatory Visit: Payer: Self-pay | Admitting: Cardiovascular Disease

## 2021-09-14 ENCOUNTER — Encounter (HOSPITAL_BASED_OUTPATIENT_CLINIC_OR_DEPARTMENT_OTHER): Payer: Self-pay | Admitting: Obstetrics & Gynecology

## 2021-09-14 ENCOUNTER — Other Ambulatory Visit (HOSPITAL_COMMUNITY)
Admission: RE | Admit: 2021-09-14 | Discharge: 2021-09-14 | Disposition: A | Discharge status: Home / Self Care | Payer: 59 | Source: Ambulatory Visit | Attending: Obstetrics & Gynecology | Admitting: Obstetrics & Gynecology

## 2021-09-14 ENCOUNTER — Ambulatory Visit (INDEPENDENT_AMBULATORY_CARE_PROVIDER_SITE_OTHER): Payer: 59 | Admitting: Obstetrics & Gynecology

## 2021-09-14 VITALS — BP 158/106 | HR 82 | Ht 66.0 in | Wt 143.6 lb

## 2021-09-14 DIAGNOSIS — J069 Acute upper respiratory infection, unspecified: Secondary | ICD-10-CM | POA: Diagnosis not present

## 2021-09-14 DIAGNOSIS — N941 Unspecified dyspareunia: Secondary | ICD-10-CM | POA: Diagnosis present

## 2021-09-14 DIAGNOSIS — E559 Vitamin D deficiency, unspecified: Secondary | ICD-10-CM

## 2021-09-14 DIAGNOSIS — N9419 Other specified dyspareunia: Secondary | ICD-10-CM

## 2021-09-14 MED ORDER — ESTRADIOL 0.1 MG/GM VA CREA: TOPICAL_CREAM | VAGINAL | 6 refills | Status: DC

## 2021-09-14 NOTE — Progress Notes (Signed)
GYNECOLOGY  VISIT ? ?CC:   painful intercourse ? ?HPI: ?46 y.o. G2P1102 Married White or Caucasian female here for complaint of painful intercourse that is a new issues.   Reports she was sick with upper respiratory issues from October to December.  Just kept getting sick.  Was finally diagnosed with Covid in December and treated with Paxlovid.  Finally, symptoms started ot improve.  Reports she was on multiple medications including antibiotics, steroids (oral and injections).  Decided to see Dr. Derrell Lolling, with family functional medicine.  Had significant blood work which she brought today.  Vit D was low and she has been treated for this.  Estradiol was around 400.  She has started several supplements.  She has done some elimination diet as well.  Is going to have blood work repeated later this month.   ? ?Starting in February, she thought she may have had bacterial vaginosis.  She did Sitz bath for several days.  This did not help.  Reports now having painful intercourse about 7 out of 10 times.  Sometimes needs to stop.  Not sure how all of this is related but feels related to her.  Denies vaginal bleeding.  Has IUD. ? ? ?Patient Active Problem List  ? Diagnosis Date Noted  ? COVID-19 virus infection 05/12/2021  ? Joint pain in both hands 01/29/2021  ? Family history of coronary artery disease in father 07/30/2019  ? Contrast media allergy 07/30/2019  ? Insomnia 06/15/2018  ? Hyperlipidemia 04/06/2018  ? Preventative health care 12/28/2016  ? Asthma, chronic 05/28/2014  ? Essential hypertension 05/28/2014  ? GERD 09/23/2009  ? ADJ DISORDER WITH MIXED ANXIETY & DEPRESSED MOOD 07/15/2009  ? Allergic rhinitis 07/06/2009  ? ? ?Past Medical History:  ?Diagnosis Date  ? Allergic rhinitis due to pollen   ? Chronic cystitis   ? Depression   ? Dyspareunia in female   ? Dyspnea on exertion   ? per pt hard to recover after exercise (cardiologist has echo and cardiac CT ordered when schedule is opened up)  ? Essential  hypertension   ? cardioloigst-  dr t. [facility]-- FH premature CAD--- 08-18-2014 normal stress echo  ? History of chronic bronchitis   ? History of HPV infection   ? remote  ? History of kidney stones   ? History of recurrent UTIs   ? Pure hypercholesterolemia 09/20/2018  ? Renal calculus, left   ? per pt non-obstructive  ? ? ?Past Surgical History:  ?Procedure Laterality Date  ? CESAREAN SECTION  2004, 2005  ? CYSTOSCOPY N/A 11/06/2018  ? Procedure: CYSTOSCOPY;  Surgeon: Jerene Bears, MD;  Location: [facility];  Service: Gynecology;  Laterality: N/A;  ? INTRAUTERINE DEVICE (IUD) INSERTION N/A 11/06/2018  ? Procedure: INTRAUTERINE DEVICE (IUD) INSERTION WITH ULTRASOUND GUIDANCE;  Surgeon: Jerene Bears, MD;  Location: [facility];  Service: Gynecology;  Laterality: N/A;  ? IUD REMOVAL N/A 11/06/2018  ? Procedure: INTRAUTERINE DEVICE (IUD) REMOVAL;  Surgeon: Jerene Bears, MD;  Location: [facility];  Service: Gynecology;  Laterality: N/A;  Mirena IUD removal and reinsertion with ultrasound guidance.  ? KNEE ARTHROSCOPY Right 1995  ? with left knee realignment  ? KNEE SURGERY Left 1991  ? NASAL SINUS SURGERY  2015  ? wtih septoplasty  ? SEPTOPLASTY    ? TONSILLECTOMY  1987  ? ? ?MEDS:   ?Current Outpatient Medications on File Prior to Visit  ?Medication Sig Dispense Refill  ? albuterol (  PROVENTIL HFA;VENTOLIN HFA) 108 (90 Base) MCG/ACT inhaler Inhale 2 puffs into the lungs every 6 (six) hours as needed.    ? ALPRAZolam (XANAX) 0.25 MG tablet Take 0.5-1 tablets (0.125-0.25 mg total) by mouth 2 (two) times daily as needed for anxiety. 180 tablet 1  ? ARNUITY ELLIPTA 100 MCG/ACT AEPB Inhale 1 puff into the lungs daily.    ? azelastine (ASTELIN) 0.1 % nasal spray Place into both nostrils 2 (two) times daily as needed. Use in each nostril as directed    ? buPROPion (WELLBUTRIN XL) 300 MG 24 hr tablet Take 1 tablet (300 mg total) by mouth daily. 90 tablet 1  ? cetirizine (ZYRTEC)  10 MG tablet Take 10 mg by mouth at bedtime.    ? clindamycin-benzoyl peroxide (BENZACLIN) gel     ? EPINEPHrine 0.3 mg/0.3 mL IJ SOAJ injection 1 in;ection    ? Eszopiclone 3 MG TABS Take 1 tablet (3 mg total) by mouth at bedtime as needed. Take immediately before bedtime 90 tablet 1  ? fluticasone (FLONASE SENSIMIST) 27.5 MCG/SPRAY nasal spray     ? L-Lysine HCl 1000 MG TABS Take by mouth as needed.    ? Probiotic Product (PROBIOTIC-10 PO) Take by mouth daily.    ? Spacer/Aero-Holding Chambers [facility](OPTICHAMBER DIAMOND) MISC See admin instructions.    ? triamterene-hydrochlorothiazide (MAXZIDE-25) 37.5-25 MG tablet TAKE 1 TABLET BY MOUTH EVERY DAY NEED APPOINTMENT 90 tablet 3  ? UNABLE TO FIND Med Name: different supplements from functional medicine dr, they change from month to month.    ? valACYclovir (VALTREX) 500 MG tablet Take 1,000 mg by mouth 3 (three) times daily as needed.     ? benzonatate (TESSALON) 100 MG capsule Take 1 capsule (100 mg total) by mouth 3 (three) times daily as needed for cough. (Patient not taking: Reported on 07/27/2021) 30 capsule 1  ? ?No current facility-administered medications on file prior to visit.  ? ? ?ALLERGIES: Clindamycin, Penicillins, Amoxicillin, Codeine, Contrast media [iodinated contrast media], Lisinopril, Doxycycline, Erythromycin, Iohexol, and Neomycin ? ?Family History  ?Problem Relation Age of Onset  ? Hypertension Mother   ? Hyperlipidemia Mother   ? Rheum arthritis Mother   ? Sjogren's syndrome Mother   ? Hypertension Father   ? Hyperlipidemia Father   ? Coronary artery disease Father   ?     MI 3750s, smoked, CABG, MULTIPLE STENTS  ? Colon cancer Maternal Grandfather   ? Bipolar disorder Sister   ? Alcohol abuse Sister   ? Depression Sister   ? Rheum arthritis Sister   ? Obesity Sister   ? Parkinson's disease Maternal Grandmother   ? Alzheimer's disease Paternal Grandmother   ? ? ?SH:  single, non smoker ? ?Review of Systems  ?All other systems reviewed and are  negative. ? ?PHYSICAL EXAMINATION:   ? ?BP (!) 158/106 (BP Location: Right Arm, Patient Position: Sitting, Cuff Size: Normal)   Pulse 82   Ht 5\' 6"  (1.676 m) Comment: reported  Wt 143 lb 9.6 oz (65.1 kg)   BMI 23.18 kg/m?     ?General appearance: alert, cooperative and appears stated age ?Lymph:  no inguinal LAD noted ? ?Pelvic: External genitalia:  inner labia majora is erythematous without any significant discharge ?             Urethra:  normal appearing urethra with no masses, tenderness or lesions ?             Bartholins and Skenes: normal    ?  Vagina: normal appearing vagina with normal color and discharge, no lesions ?             Cervix: no lesions ?              ?Chaperone, Ina Homes, CMA, was present for exam. ? ?Assessment/Plan: ?1. Dyspareunia, female ?- given recent antibiotic and steroid use, feel vaginitis testing is reasonable.  As well, will start vaginal estradiol cream.  Pt comfortable with plan. ?- Cervicovaginal ancillary only( [place]) ?- estradiol (ESTRACE) 0.1 MG/GM vaginal cream; 1 gram vaginally twice weekly  Dispense: 42.5 g; Refill: 6 ? ?2. Vitamin D deficiency ?- is taking supplemental Vit D ? ?3. Recent URI ?- this does seem to have finally resolved.  ? ? ?

## 2021-09-16 LAB — CERVICOVAGINAL ANCILLARY ONLY
Bacterial Vaginitis (gardnerella): NEGATIVE
Candida Glabrata: NEGATIVE
Candida Vaginitis: NEGATIVE
Comment: NEGATIVE
Comment: NEGATIVE
Comment: NEGATIVE

## 2021-10-18 ENCOUNTER — Encounter (HOSPITAL_BASED_OUTPATIENT_CLINIC_OR_DEPARTMENT_OTHER): Payer: Self-pay | Admitting: Obstetrics & Gynecology

## 2021-11-02 ENCOUNTER — Other Ambulatory Visit (HOSPITAL_BASED_OUTPATIENT_CLINIC_OR_DEPARTMENT_OTHER): Payer: 59

## 2021-11-02 DIAGNOSIS — N941 Unspecified dyspareunia: Secondary | ICD-10-CM

## 2021-11-03 LAB — FOLLICLE STIMULATING HORMONE: FSH: 5.7 m[IU]/mL

## 2022-02-02 ENCOUNTER — Encounter: Payer: Self-pay | Admitting: Physician Assistant

## 2022-02-02 ENCOUNTER — Ambulatory Visit (INDEPENDENT_AMBULATORY_CARE_PROVIDER_SITE_OTHER): Payer: 59 | Admitting: Physician Assistant

## 2022-02-02 DIAGNOSIS — F411 Generalized anxiety disorder: Secondary | ICD-10-CM | POA: Diagnosis not present

## 2022-02-02 DIAGNOSIS — F3342 Major depressive disorder, recurrent, in full remission: Secondary | ICD-10-CM

## 2022-02-02 DIAGNOSIS — G4700 Insomnia, unspecified: Secondary | ICD-10-CM

## 2022-02-02 MED ORDER — ALPRAZOLAM 0.25 MG PO TABS: 0.1250 mg | ORAL_TABLET | Freq: Two times a day (BID) | ORAL | 1 refills | Status: DC | PRN

## 2022-02-02 MED ORDER — ESZOPICLONE 3 MG PO TABS: 3.0000 mg | ORAL_TABLET | Freq: Every evening | ORAL | 1 refills | Status: DC | PRN

## 2022-02-02 MED ORDER — BUPROPION HCL ER (XL) 300 MG PO TB24: 300.0000 mg | ORAL_TABLET | Freq: Every day | ORAL | 1 refills | Status: DC

## 2022-02-02 NOTE — Progress Notes (Signed)
[facility] Med Check  Patient ID: Shelley Figueroa,  MRN: [id number]  PCP: Doreene Nest, NP  Date of Evaluation: 02/02/2022 Time spent:20 minutes  Chief Complaint:  Chief Complaint   Anxiety; Depression; Insomnia; Follow-up    HISTORY/CURRENT STATUS: HPI For routine med check.   Doing well. Patient is able to enjoy things.  Energy and motivation are good.  Work is going well.   No extreme sadness, tearfulness, or feelings of hopelessness.  Sleeps well, does need the Lunesta every night.  She does not really get enough sleep but it is mostly because she and her husband are both night owls so they like to stay up late anyway.  ADLs and personal hygiene are normal.   Denies any changes in concentration, making decisions, or remembering things.  Appetite has not changed.  Weight is stable.  Does have anxiety if she does not take the Xanax.  She does take it twice a day, if she does not she feels nervous inside, like something bad may happen.  Not having panic attacks but more of a generalized sense of unease.  The Xanax is helpful.  Denies suicidal or homicidal thoughts.  Her oldest son went off to college a few weeks ago, is a Printmaker at [employer].  Her youngest son is a Holiday representative year in high school.  Denies dizziness, syncope, seizures, numbness, tingling, tremor, tics, unsteady gait, slurred speech, confusion. Denies muscle or joint pain, stiffness, or dystonia.  Individual Medical History/ Review of Systems: Changes? :No      Past medications for mental health diagnoses include: Ambien, Lunesta, Wellbutrin, Xanax, Melatonin causes h/a  Allergies: Clindamycin, Penicillins, Amoxicillin, Codeine, Contrast media [iodinated contrast media], Lisinopril, Doxycycline, Erythromycin, Iohexol, and Neomycin  Current Medications:  Current Outpatient Medications:    albuterol (PROVENTIL HFA;VENTOLIN HFA) 108 (90 Base) MCG/ACT inhaler, Inhale 2 puffs into the lungs every 6 (six)  hours as needed., Disp: , Rfl:    azelastine (ASTELIN) 0.1 % nasal spray, Place into both nostrils 2 (two) times daily as needed. Use in each nostril as directed, Disp: , Rfl:    cetirizine (ZYRTEC) 10 MG tablet, Take 10 mg by mouth at bedtime., Disp: , Rfl:    clindamycin-benzoyl peroxide (BENZACLIN) gel, , Disp: , Rfl:    estradiol (ESTRACE) 0.1 MG/GM vaginal cream, 1 gram vaginally twice weekly, Disp: 42.5 g, Rfl: 6   FLOVENT HFA 44 MCG/ACT inhaler, Inhale 2 puffs into the lungs 2 (two) times daily., Disp: , Rfl:    fluticasone (FLONASE SENSIMIST) 27.5 MCG/SPRAY nasal spray, , Disp: , Rfl:    L-Lysine HCl 1000 MG TABS, Take by mouth as needed., Disp: , Rfl:    Spacer/Aero-Holding Chambers (OPTICHAMBER DIAMOND) MISC, See admin instructions., Disp: , Rfl:    triamterene-hydrochlorothiazide (MAXZIDE-25) 37.5-25 MG tablet, TAKE 1 TABLET BY MOUTH EVERY DAY NEED APPOINTMENT, Disp: 90 tablet, Rfl: 3   valACYclovir (VALTREX) 500 MG tablet, Take 1,000 mg by mouth 3 (three) times daily as needed. , Disp: , Rfl:    ALPRAZolam (XANAX) 0.25 MG tablet, Take 0.5-1 tablets (0.125-0.25 mg total) by mouth 2 (two) times daily as needed for anxiety., Disp: 180 tablet, Rfl: 1   ARNUITY ELLIPTA 100 MCG/ACT AEPB, Inhale 1 puff into the lungs daily. (Patient not taking: Reported on 02/02/2022), Disp: , Rfl:    benzonatate (TESSALON) 100 MG capsule, Take 1 capsule (100 mg total) by mouth 3 (three) times daily as needed for cough. (Patient not taking: Reported on 07/27/2021), Disp: 30  capsule, Rfl: 1   buPROPion (WELLBUTRIN XL) 300 MG 24 hr tablet, Take 1 tablet (300 mg total) by mouth daily., Disp: 90 tablet, Rfl: 1   EPINEPHrine 0.3 mg/0.3 mL IJ SOAJ injection, 1 in;ection (Patient not taking: Reported on 02/02/2022), Disp: , Rfl:    Eszopiclone 3 MG TABS, Take 1 tablet (3 mg total) by mouth at bedtime as needed. Take immediately before bedtime, Disp: 90 tablet, Rfl: 1   Probiotic Product (PROBIOTIC-10 PO), Take by mouth  daily. (Patient not taking: Reported on 02/02/2022), Disp: , Rfl:  Medication Side Effects: none  Family Medical/ Social History: Changes?  No  MENTAL HEALTH EXAM:  There were no vitals taken for this visit.There is no height or weight on file to calculate BMI.  General Appearance: Casual, Neat and Well Groomed  Eye Contact:  Good  Speech:  Clear and Coherent  Volume:  Normal  Mood:  Euthymic  Affect:  Appropriate  Thought Process:  Goal Directed and Descriptions of Associations: Circumstantial  Orientation:  Full (Time, Place, and Person)  Thought Content: Logical   Suicidal Thoughts:  No  Homicidal Thoughts:  No  Memory:  WNL  Judgement:  Good  Insight:  Good  Psychomotor Activity:  Normal  Concentration:  Concentration: Good and Attention Span: Good  Recall:  Good  Fund of Knowledge: Good  Language: Good  Assets:  Desire for Improvement Financial Resources/Insurance Housing Transportation Vocational/Educational  ADL's:  Intact  Cognition: WNL  Prognosis:  Good   DIAGNOSES:    ICD-10-CM   1. Recurrent major depressive disorder, in full remission (HCC)  F33.42     2. Generalized anxiety disorder  F41.1     3. Insomnia, unspecified type  G47.00       Receiving Psychotherapy: No      RECOMMENDATIONS:  PDMP was reviewed.  Last Xanax was filled 11/13/2021.  Lunesta filled 11/14/2021. I provided 20 minutes of face to face time during this encounter, including time spent before and after the visit in records review, medical decision making, counseling pertinent to today's visit, and charting.   She is doing well as far as her medications go so no changes will be made. Sleep hygiene discussed.  Continue Xanax 0.25 mg, 1/2-1 twice daily. Continue Wellbutrin XL 300 mg 1 p.o. daily. Continue Lunesta 3 mg nightly as needed. Return in 6 months.  Melony Overly, PA-C

## 2022-02-15 ENCOUNTER — Encounter (HOSPITAL_BASED_OUTPATIENT_CLINIC_OR_DEPARTMENT_OTHER): Payer: Self-pay | Admitting: Cardiovascular Disease

## 2022-02-15 ENCOUNTER — Ambulatory Visit (INDEPENDENT_AMBULATORY_CARE_PROVIDER_SITE_OTHER): Payer: 59 | Admitting: Cardiovascular Disease

## 2022-02-15 DIAGNOSIS — I10 Essential (primary) hypertension: Secondary | ICD-10-CM | POA: Diagnosis not present

## 2022-02-15 DIAGNOSIS — M79672 Pain in left foot: Secondary | ICD-10-CM | POA: Diagnosis not present

## 2022-02-15 DIAGNOSIS — M79673 Pain in unspecified foot: Secondary | ICD-10-CM

## 2022-02-15 DIAGNOSIS — E785 Hyperlipidemia, unspecified: Secondary | ICD-10-CM | POA: Diagnosis not present

## 2022-02-15 HISTORY — DX: Pain in unspecified foot: M79.673

## 2022-02-15 MED ORDER — VALSARTAN-HYDROCHLOROTHIAZIDE 160-25 MG PO TABS: 1.0000 | ORAL_TABLET | Freq: Every day | ORAL | 3 refills | Status: DC

## 2022-02-15 NOTE — Patient Instructions (Signed)
Medication Instructions:  STOP TRIAMTERENE   START VALSARTAN-HCT 160-25 MG DAILY   *If you need a refill on your cardiac medications before your next appointment, please call your pharmacy*  Lab Work: FASTING LP/CMET/A1C/URIC ACID IN 1 WEEK   If you have labs (blood work) drawn today and your tests are completely normal, you will receive your results only by: [place] (if you have MyChart) OR A paper copy in the mail If you have any lab test that is abnormal or we need to change your treatment, we will call you to review the results.  Testing/Procedures: NONE  Follow-Up: At [facility], you and your health needs are our priority.  As part of our continuing mission to provide you with exceptional heart care, we have created designated Provider Care Teams.  These Care Teams include your primary Cardiologist (physician) and Advanced Practice Providers (APPs -  Physician Assistants and Nurse Practitioners) who all work together to provide you with the care you need, when you need it.  We recommend signing up for the patient portal called "MyChart".  Sign up information is provided on this After Visit Summary.  MyChart is used to connect with patients for Virtual Visits (Telemedicine).  Patients are able to view lab/test results, encounter notes, upcoming appointments, etc.  Non-urgent messages can be sent to your provider as well.   To learn more about what you can do with MyChart, go to [email or web address].    Your next appointment:   1 MONTH WITH PHARM D   6 MONTHS WITH DR [facility]

## 2022-02-15 NOTE — Assessment & Plan Note (Addendum)
She notes that she has gained weight which is likely contributing to her higher blood pressures.  Blood pressures have been poorly controlled lately.  She is been under a lot of stress getting her sons off to college and her younger son going into his senior year.  She identified exercises at goal.  She is already limiting the sodium in her diet.  For now, we will stop the HCTZ/triamterene and start valsartan/HCTZ 160/25 mg daily.  Check lipids and a CMP as well as an A1c in 1 week.  She will work on trying to lose some of this weight and hopefully we can reduce her medication in the future.  BP goal <130/80.

## 2022-02-15 NOTE — Progress Notes (Signed)
Cardiology Office Note    Evaluation Performed:  Follow-up visit  Date:  02/15/2022   ID:  Shelley Figueroa, DOB [birth year], MRN [id number]  PCP:  Pleas Koch, NP  Cardiologist:  Skeet Latch, MD  Electrophysiologist:  None   Chief Complaint: hypertension  History of Present Illness:    Shelley Figueroa is a 46 y.o. female with hypertension and chronic bronchitis here for follow-up on hypertension and family history of heart disease.   Shelley Figueroa was initially diagnosed with hypertension around 2016. She initially tried lisinopril but developed a cough.  It was well-controlled on hydrochlorothiazide.  However her blood pressure then started to get higher.  She does have a stressful job but this had not changed. Shelley Figueroa is also concerned about a family history of heart disease.  Both her parents smoked heavily.  Her father had a heart attack in his early 42s and multiple stents placed.  He later went on to have quadruple bypass surgery.  Shelley Figueroa had a stress echo in 2015 that was negative for ischemia.  Her symptoms were thought to be musculoskeletal.   Shelley Figueroa followed up with Jory Sims and reported exertional dyspnea.  She had an echo 12/2018 that revealed normal systolic function and diastolic function.  It was otherwise unremarkable.  She also had a coronary CTA at that time that revealed no coronary artery disease and normal anatomy.  She did have a contrast reaction that required steroids.    At the last visit she was doing well and her blood pressure was controlled. Today she reports she is doing pretty well overall but her blood pressure has been high. It has not been "head pounding" bad but she can tell her BP is high. At home her readings are usually around 135/82; rarely she has seen 176 systolic. The only time she is getting normal numbers is when she checks it right after she takes her medication in the morning. Initially in the office  today her blood pressure was 148/102; on examination a retake of blood pressure was 140/82, which she reports is within the range she has seen at home. She has not been getting enough exercise. She walks the dog daily but not for a long walk, and usually at a leisurely pace. Her stress level has been very high lately due to it being the busiest time of the year for her at work in [employer]. She also has one child in college and the other is a Equities trader in high school. Therefore her stress is high and she has been unable to find the time to exercise. Her exercise is also limited by a strained muscle in her leg which occurred a few months ago, and it does not seem to be healing quickly. Regarding her diet, she has given up gluten. She saw a holistic diet specialist and she reduced a lot of the dairy in her diet. She cut salt from her diet a long time ago. She usually has a cup of coffee or tea in the morning and maybe a soda during the day. Occasionally she will have a shooting pain run through her left foot to her big toe. She was a little concerned about it since her mother has gout. She also has been experiencing back issues. She denies any palpitations, chest pain, shortness of breath, or peripheral edema. No lightheadedness, headaches, syncope, orthopnea, or PND.   Past Medical History:  Diagnosis Date   Allergic rhinitis due to  pollen    Chronic cystitis    Depression    Dyspareunia in female    Dyspnea on exertion    per pt hard to recover after exercise (cardiologist has echo and cardiac CT ordered when schedule is opened up)   Essential hypertension    cardioloigst-  dr t. Duke Salviarandolph-- FH premature CAD--- 08-18-2014 normal stress echo   Foot pain 02/15/2022   History of chronic bronchitis    History of HPV infection    remote   History of kidney stones    History of recurrent UTIs    Pure hypercholesterolemia 09/20/2018   Renal calculus, left    per pt non-obstructive   Past Surgical  History:  Procedure Laterality Date   CESAREAN SECTION  2004, 2005   CYSTOSCOPY N/A 11/06/2018   Procedure: CYSTOSCOPY;  Surgeon: Jerene BearsMiller, Mary S, MD;  [facility] [place];  Service: Gynecology;  Laterality: N/A;   INTRAUTERINE DEVICE (IUD) INSERTION N/A 11/06/2018   Procedure: INTRAUTERINE DEVICE (IUD) INSERTION WITH ULTRASOUND GUIDANCE;  Surgeon: Jerene BearsMiller, Mary S, MD;  [facility] [place];  Service: Gynecology;  Laterality: N/A;   IUD REMOVAL N/A 11/06/2018   Procedure: INTRAUTERINE DEVICE (IUD) REMOVAL;  Surgeon: Jerene BearsMiller, Mary S, MD;  [facility] ;  Service: Gynecology;  Laterality: N/A;  Mirena IUD removal and reinsertion with ultrasound guidance.   KNEE ARTHROSCOPY Right 1995   with left knee realignment   KNEE SURGERY Left 1991   NASAL SINUS SURGERY  2015   wtih septoplasty   SEPTOPLASTY     TONSILLECTOMY  1987     Current Meds  Medication Sig   albuterol (PROVENTIL HFA;VENTOLIN HFA) 108 (90 Base) MCG/ACT inhaler Inhale 2 puffs into the lungs every 6 (six) hours as needed.   ALPRAZolam (XANAX) 0.25 MG tablet Take 0.5-1 tablets (0.125-0.25 mg total) by mouth 2 (two) times daily as needed for anxiety.   azelastine (ASTELIN) 0.1 % nasal spray Place into both nostrils 2 (two) times daily as needed. Use in each nostril as directed   buPROPion (WELLBUTRIN XL) 300 MG 24 hr tablet Take 1 tablet (300 mg total) by mouth daily.   cetirizine (ZYRTEC) 10 MG tablet Take 10 mg by mouth at bedtime.   clindamycin-benzoyl peroxide (BENZACLIN) gel    EPINEPHrine 0.3 mg/0.3 mL IJ SOAJ injection    estradiol (ESTRACE) 0.1 MG/GM vaginal cream 1 gram vaginally twice weekly   Eszopiclone 3 MG TABS Take 1 tablet (3 mg total) by mouth at bedtime as needed. Take immediately before bedtime   FLOVENT HFA 44 MCG/ACT inhaler Inhale 2 puffs into the lungs 2 (two) times daily.   fluticasone (FLONASE SENSIMIST) 27.5 MCG/SPRAY nasal spray    L-Lysine HCl 1000 MG TABS  Take by mouth as needed.   Spacer/Aero-Holding Chambers [facility](OPTICHAMBER DIAMOND) MISC See admin instructions.   valACYclovir (VALTREX) 500 MG tablet Take 1,000 mg by mouth 3 (three) times daily as needed.    valsartan-hydrochlorothiazide (DIOVAN HCT) 160-25 MG tablet Take 1 tablet by mouth daily.   [DISCONTINUED] triamterene-hydrochlorothiazide (MAXZIDE-25) 37.5-25 MG tablet TAKE 1 TABLET BY MOUTH EVERY DAY NEED APPOINTMENT     Allergies:   Clindamycin, Penicillins, Amoxicillin, Codeine, Contrast media [iodinated contrast media], Lisinopril, Doxycycline, Erythromycin, Iohexol, and Neomycin   Social History   Tobacco Use   Smoking status: Never   Smokeless tobacco: Never  Vaping Use   Vaping Use: Never used  Substance Use Topics   Alcohol use: Yes    Alcohol/week:  0.0 - 1.0 standard drinks of alcohol    Comment: very rare   Drug use: Never     Family Hx: The patient's family history includes Alcohol abuse in her sister; Alzheimer's disease in her paternal grandmother; Bipolar disorder in her sister; Colon cancer in her maternal grandfather; Coronary artery disease in her father; Depression in her sister; Hyperlipidemia in her father and mother; Hypertension in her father and mother; Obesity in her sister; Parkinson's disease in her maternal grandmother; Rheum arthritis in her mother and sister; Sjogren's syndrome in her mother.  ROS:   Please see the history of present illness.    (+) back pain (+) toe pain All other systems reviewed and are negative.   Prior CV studies:   The following studies were reviewed today:  Stress echo 08/18/14: Study Conclusions  - Stress ECG conclusions: There were no stress arrhythmias or   conduction abnormalities. The stress ECG was normal. - Staged echo: Left ventricular ejection fraction was normal at   rest and with stress. Normal echo stress.  LVEF 60%.  Echo 01/24/19: IMPRESSIONS:   1. The left ventricle has hyperdynamic systolic function,  with an  ejection fraction of >65%. The cavity size was normal. Left ventricular  diastolic parameters were normal.   2. The average left ventricular global longitudinal strain is -23.9 %.   3. The right ventricle has normal systolic function. The cavity was  normal. There is no increase in right ventricular wall thickness.   4. The aorta is normal unless otherwise noted.   FINDINGS   Left Ventricle: The left ventricle has hyperdynamic systolic function,  with an ejection fraction of >65%. The cavity size was normal. There is no  increase in left ventricular wall thickness. Left ventricular diastolic  parameters were normal. Normal left   ventricular filling pressures The average left ventricular global  longitudinal strain is -23.9 %.   Coronary CT-A 01/25/19:   IMPRESSION: 1. Coronary calcium score of 0. This was 0 percentile for age and sex matched control. 2. Normal coronary origin with right dominance. 3. No evidence of CAD.  Labs/Other Tests and Data Reviewed:    EKG: EKG is personally reviewed.  02/15/2022: Sinus rhythm 91 bpm. Nonspecific S-T changes.   An ECG dated 07/20/14 was personally reviewed today and demonstrated:  sinus rhythm.  Rate 87 bpm   02/14/2020: Sinus rhythm.  Rate 79 bpm.  Recent Labs: No results found for requested labs within last 365 days.   Recent Lipid Panel Lab Results  Component Value Date/Time   CHOL 224 (H) 11/06/2019 09:14 AM   TRIG 90 11/06/2019 09:14 AM   HDL 70 11/06/2019 09:14 AM   CHOLHDL 3.2 11/06/2019 09:14 AM   CHOLHDL 4 03/30/2018 08:04 AM   LDLCALC 138 (H) 11/06/2019 09:14 AM    Wt Readings from Last 3 Encounters:  02/15/22 152 lb 8 oz (69.2 kg)  09/14/21 143 lb 9.6 oz (65.1 kg)  05/12/21 149 lb (67.6 kg)     Objective:    VS:  BP (!) 140/82 (BP Location: Right Arm, Patient Position: Sitting, Cuff Size: Normal)   Pulse 91   Ht 5\' 6"  (1.676 m)   Wt 152 lb 8 oz (69.2 kg)   BMI 24.61 kg/m  , BMI Body mass index is  24.61 kg/m. GENERAL:  Well appearing HEENT: Pupils equal round and reactive, fundi not visualized, oral mucosa unremarkable NECK:  No jugular venous distention, waveform within normal limits, carotid upstroke brisk and symmetric,  no bruits, no thyromegaly LUNGS:  Clear to auscultation bilaterally HEART:  RRR.  PMI not displaced or sustained,S1 and S2 within normal limits, no S3, no S4, no clicks, no rubs, no murmurs ABD:  Flat, positive bowel sounds normal in frequency in pitch, no bruits, no rebound, no guarding, no midline pulsatile mass, no hepatomegaly, no splenomegaly EXT:  2 plus pulses throughout, no edema, no cyanosis no clubbing SKIN:  No rashes no nodules NEURO:  Cranial nerves II through XII grossly intact, motor grossly intact throughout PSYCH:  Cognitively intact, oriented to person place and time   ASSESSMENT & PLAN:    Essential hypertension She notes that she has gained weight which is likely contributing to her higher blood pressures.  Blood pressures have been poorly controlled lately.  She is been under a lot of stress getting her sons off to college and her younger son going into his senior year.  She identified exercises at goal.  She is already limiting the sodium in her diet.  For now, we will stop the HCTZ/triamterene and start valsartan/HCTZ 160/25 mg daily.  Check lipids and a CMP as well as an A1c in 1 week.  She will work on trying to lose some of this weight and hopefully we can reduce her medication in the future.  BP goal <130/80.  Hyperlipidemia Check fasting lipids and a CMP.  Foot pain Shelley Figueroa reports sharp, shooting pain in her left foot going to her great toe.  She has no swelling or point tenderness.  Therefore I doubt that it is gout.  However she is concerned because her mom has a history of gout.  We will check a uric acid level.  We discussed 5 it is more likely radicular pain coming from her lower back.   Disposition:  Follow up with Pharm D  in 1 month.  Follow up with Kodi Guerrera C. Duke Salvia, MD, [facility] in 6 months  Medication Adjustments/Labs and Tests Ordered: Current medicines are reviewed at length with the patient today.  Concerns regarding medicines are outlined above.   Tests Ordered: Orders Placed This Encounter  Procedures   Comprehensive metabolic panel   Lipid panel   HgB A1c   Uric acid   AMB Referral to [facility] Pharm-D   EKG 12-Lead    Medication Changes: Meds ordered this encounter  Medications   valsartan-hydrochlorothiazide (DIOVAN HCT) 160-25 MG tablet    Sig: Take 1 tablet by mouth daily.    Dispense:  90 tablet    Refill:  3    D/C TRIAMTERENE     I,Jessica Ford,acting as a scribe for Chilton Si, MD.,have documented all relevant documentation on the behalf of Chilton Si, MD,as directed by  Chilton Si, MD while in the presence of Chilton Si, MD.   I, Laken Lobato C. Duke Salvia, MD have reviewed all documentation for this visit.  The documentation of the exam, diagnosis, procedures, and orders on 02/15/2022 are all accurate and complete.   Signed, Chilton Si, MD  02/15/2022 5:45 PM    [facility]

## 2022-02-15 NOTE — Assessment & Plan Note (Signed)
Ms. Brownstein reports sharp, shooting pain in her left foot going to her great toe.  She has no swelling or point tenderness.  Therefore I doubt that it is gout.  However she is concerned because her mom has a history of gout.  We will check a uric acid level.  We discussed 5 it is more likely radicular pain coming from her lower back.

## 2022-02-15 NOTE — Assessment & Plan Note (Signed)
Check fasting lipids and a CMP.

## 2022-02-24 LAB — LIPID PANEL
Chol/HDL Ratio: 3.9 ratio (ref 0.0–4.4)
Cholesterol, Total: 236 mg/dL — ABNORMAL HIGH (ref 100–199)
HDL: 60 mg/dL (ref >39)
LDL Chol Calc (NIH): 144 mg/dL — ABNORMAL HIGH (ref 0–99)
Triglycerides: 178 mg/dL — ABNORMAL HIGH (ref 0–149)
VLDL Cholesterol Cal: 32 mg/dL (ref 5–40)

## 2022-02-24 LAB — COMPREHENSIVE METABOLIC PANEL
ALT: 35 IU/L — ABNORMAL HIGH (ref 0–32)
AST: 29 IU/L (ref 0–40)
Albumin/Globulin Ratio: 2.2 (ref 1.2–2.2)
Albumin: 4.2 g/dL (ref 3.9–4.9)
Alkaline Phosphatase: 71 IU/L (ref 44–121)
BUN/Creatinine Ratio: 14 (ref 9–23)
BUN: 13 mg/dL (ref 6–24)
Bilirubin Total: 0.4 mg/dL (ref 0.0–1.2)
CO2: 24 mmol/L (ref 20–29)
Calcium: 9 mg/dL (ref 8.7–10.2)
Chloride: 98 mmol/L (ref 96–106)
Creatinine, Ser: 0.96 mg/dL (ref 0.57–1.00)
Globulin, Total: 1.9 g/dL (ref 1.5–4.5)
Glucose: 92 mg/dL (ref 70–99)
Potassium: 3.3 mmol/L — ABNORMAL LOW (ref 3.5–5.2)
Sodium: 139 mmol/L (ref 134–144)
Total Protein: 6.1 g/dL (ref 6.0–8.5)
eGFR: 73 mL/min/{1.73_m2} (ref >59)

## 2022-02-24 LAB — HEMOGLOBIN A1C
Est. average glucose Bld gHb Est-mCnc: 97 mg/dL
Hgb A1c MFr Bld: 5 % (ref 4.8–5.6)

## 2022-02-24 LAB — URIC ACID: Uric Acid: 6.1 mg/dL (ref 2.6–6.2)

## 2022-02-25 ENCOUNTER — Encounter (HOSPITAL_BASED_OUTPATIENT_CLINIC_OR_DEPARTMENT_OTHER): Payer: Self-pay | Admitting: Obstetrics & Gynecology

## 2022-02-25 ENCOUNTER — Telehealth (HOSPITAL_BASED_OUTPATIENT_CLINIC_OR_DEPARTMENT_OTHER): Payer: Self-pay

## 2022-02-25 DIAGNOSIS — E785 Hyperlipidemia, unspecified: Secondary | ICD-10-CM

## 2022-02-25 MED ORDER — POTASSIUM CHLORIDE ER 20 MEQ PO TBCR: 20.0000 meq | EXTENDED_RELEASE_TABLET | Freq: Every day | ORAL | 5 refills | Status: DC

## 2022-02-25 NOTE — Telephone Encounter (Addendum)
Seen by patient [name] on 02/25/2022 11:22 AM; Kayleen Memos PLACED AND FOLLOW UP [place] TO PATIENT.      ----- Message from Loel Dubonnet, NP sent at 02/25/2022 11:14 AM EDT ----- Normal kidney function. One of liver enzymes mildly elevated - recommend avoidance of alcohol, fried foods.  Potassium mildly low at 3.3. Recommend potassium 75mEq daily x 3 days. BMP in 1 week for monitoring of potassium level. A1c with no diabetes.  Uric acid normal-no evidence of gout.  Cholesterol panel markedly elevated.  10 year ASCVD risk score 1.9%, does not qualify for cholesterol medication. Recommend increasing physical activity to 150 minutes of exercise per week, following heart healthy diet. Please offer referral to PREP program. Repeat FLP/LFT in 6 mos for monitoring.

## 2022-03-01 ENCOUNTER — Telehealth: Payer: Self-pay

## 2022-03-01 LAB — BASIC METABOLIC PANEL
BUN/Creatinine Ratio: 14 (ref 9–23)
BUN: 14 mg/dL (ref 6–24)
CO2: 25 mmol/L (ref 20–29)
Calcium: 9.2 mg/dL (ref 8.7–10.2)
Chloride: 99 mmol/L (ref 96–106)
Creatinine, Ser: 0.98 mg/dL (ref 0.57–1.00)
Glucose: 94 mg/dL (ref 70–99)
Potassium: 3.6 mmol/L (ref 3.5–5.2)
Sodium: 139 mmol/L (ref 134–144)
eGFR: 71 mL/min/{1.73_m2} (ref >59)

## 2022-03-01 NOTE — Telephone Encounter (Signed)
Call to pt reference PREP referral Explained program and would be best to have evening class-next one will start in Jan.  Requested info sent to [facility] reference next class times/locations along with flyer sent as requested

## 2022-03-03 NOTE — Telephone Encounter (Signed)
Please advise 

## 2022-03-04 ENCOUNTER — Telehealth: Payer: Self-pay

## 2022-03-04 NOTE — Telephone Encounter (Signed)
Call to f/u on PREP class schedule Would like to start in November however may not be able to leave work mid day to do classes.  May need evening class.  Will call her back before the start of the Nov 13th class to see if she was able to fit into schedule. If not, will plan on her starting the next evening class which will be in Jan 2024

## 2022-03-16 ENCOUNTER — Encounter (HOSPITAL_BASED_OUTPATIENT_CLINIC_OR_DEPARTMENT_OTHER): Payer: Self-pay

## 2022-03-16 ENCOUNTER — Telehealth: Payer: Self-pay

## 2022-03-16 NOTE — Telephone Encounter (Signed)
Called patient to discuss potential surgery dates, patient opted for 11/29 

## 2022-03-17 ENCOUNTER — Encounter: Payer: Self-pay | Admitting: Pharmacist Clinician (PhC)/ Clinical Pharmacy Specialist

## 2022-03-17 ENCOUNTER — Ambulatory Visit: Payer: 59 | Attending: Cardiology | Admitting: Pharmacist Clinician (PhC)/ Clinical Pharmacy Specialist

## 2022-03-17 DIAGNOSIS — I10 Essential (primary) hypertension: Secondary | ICD-10-CM

## 2022-03-17 NOTE — Patient Instructions (Signed)
Return for a a follow up appointment Tuesday January 16 at 9:30 am  Check your blood pressure at home daily and keep record of the readings.  Take your BP meds as follows:  Continue with your current medications  Bring all of your meds, your BP cuff and your record of home blood pressures to your next appointment.  Exercise as you're able, try to walk approximately 30 minutes per day.  Keep salt intake to a minimum, especially watch canned and prepared boxed foods.  Eat more fresh fruits and vegetables and fewer canned items.  Avoid eating in fast food restaurants.    HOW TO TAKE YOUR BLOOD PRESSURE: Rest 5 minutes before taking your blood pressure.  Don't smoke or drink caffeinated beverages for at least 30 minutes before. Take your blood pressure before (not after) you eat. Sit comfortably with your back supported and both feet on the floor (don't cross your legs). Elevate your arm to heart level on a table or a desk. Use the proper sized cuff. It should fit smoothly and snugly around your bare upper arm. There should be enough room to slip a fingertip under the cuff. The bottom edge of the cuff should be 1 inch above the crease of the elbow. Ideally, take 3 measurements at one sitting and record the average.

## 2022-03-17 NOTE — Progress Notes (Signed)
03/17/2022 [name] 24-Jul-1975 [id number]   HPI:  Shelley Figueroa is a 46 y.o. female patient of Dr Oval Linsey, who presents today for hypertension clinic follow up.  In addition to hypertension, her cardiac history is significant for hyperlipidemia.  She was originally seen by Dr. Oval Linsey back in 2020, virtually because of [place].  At that time she mainly had high diastolic readings (096/28 on home cuff that day).  While she does have a family history of premature CAD, her coronary calcium score was 0 and echo was normal.   Over the next year her pressure became well controlled, but she returned earlier this year (after 2 year gap), noting that home readings were again increasing.  Her triamterene/hctz was switched to valsartan/hctz 160/25.  A full panel of labs drawn after that appointment showed her to have normal A1c, elevated cholesterol and low potassium.  She was given KCl 20 mEq x 3 days and asked to repeat BMET.    Today she returns for follow up.  Her home BP readings have improved significantly since switching to valsartan hctz, although this past week has been more stressful and her numbers went up some.  She notes that they are busy with son who is senior in high school and yesterday they had to have their 54 year old family dog put down.  She does report that the pounding in her head has mostly gone away.    Blood Pressure Goal:  130/80  Current Medications:   valsartan / hctz 160/25 qd  Family Hx:   father with MI and multiple stents, in his early 41's, later CABG x 4, now 61, CKD, COPD; mother with htn, hld for years, now 78; sister with htn,   Social Hx:  no tobacco, occasional alcohol, cup of tea in the mornings (irish breakfast tea), previous Coke addict, down to about 3-4 per week, drinks mostly water,  occasional espresso   Diet: gluten free about 7 years, now starting to avoid some dairy, using oat milk; eats out "a lot" with teenagers but no fast foods -  chooses salads, non-fried foods   Exercise: enrolled in PREP - starts in January at night; hasn't been doing anything recently, but she and husband are trying to get back into a routine  Home BP readings:    home 120-130's. Diastolic 36-62"H  Greater good arm cuff  Intolerances:  lisinopril - cough  Labs: 9/23 Na 139, K 3.3, Glu 92, BUN 13, SCr 0.96, GFR 73   Wt Readings from Last 3 Encounters:  02/15/22 152 lb 8 oz (69.2 kg)  09/14/21 143 lb 9.6 oz (65.1 kg)  05/12/21 149 lb (67.6 kg)   BP Readings from Last 3 Encounters:  03/17/22 118/68  02/15/22 (!) 140/82  09/14/21 (!) 158/106   Pulse Readings from Last 3 Encounters:  02/15/22 91  09/14/21 82  05/12/21 85    Current Outpatient Medications  Medication Sig Dispense Refill   albuterol (PROVENTIL HFA;VENTOLIN HFA) 108 (90 Base) MCG/ACT inhaler Inhale 2 puffs into the lungs every 6 (six) hours as needed.     ALPRAZolam (XANAX) 0.25 MG tablet Take 0.5-1 tablets (0.125-0.25 mg total) by mouth 2 (two) times daily as needed for anxiety. 180 tablet 1   azelastine (ASTELIN) 0.1 % nasal spray Place into both nostrils 2 (two) times daily as needed. Use in each nostril as directed     buPROPion (WELLBUTRIN XL) 300 MG 24 hr tablet Take 1 tablet (  300 mg total) by mouth daily. 90 tablet 1   cetirizine (ZYRTEC) 10 MG tablet Take 10 mg by mouth at bedtime.     clindamycin-benzoyl peroxide (BENZACLIN) gel      EPINEPHrine 0.3 mg/0.3 mL IJ SOAJ injection      estradiol (ESTRACE) 0.1 MG/GM vaginal cream 1 gram vaginally twice weekly 42.5 g 6   Eszopiclone 3 MG TABS Take 1 tablet (3 mg total) by mouth at bedtime as needed. Take immediately before bedtime 90 tablet 1   FLOVENT HFA 44 MCG/ACT inhaler Inhale 2 puffs into the lungs 2 (two) times daily.     fluticasone (FLONASE SENSIMIST) 27.5 MCG/SPRAY nasal spray      L-Lysine HCl 1000 MG TABS Take by mouth as needed.     Spacer/Aero-Holding Chambers [facility] DIAMOND) MISC See admin  instructions.     valACYclovir (VALTREX) 500 MG tablet Take 1,000 mg by mouth 3 (three) times daily as needed.      valsartan-hydrochlorothiazide (DIOVAN HCT) 160-25 MG tablet Take 1 tablet by mouth daily. 90 tablet 3   potassium chloride 20 MEQ TBCR Take 20 mEq by mouth daily. (Patient not taking: Reported on 03/17/2022) 30 tablet 5   No current facility-administered medications for this visit.    Allergies  Allergen Reactions   Clindamycin Nausea And Vomiting   Penicillins Hives and Shortness Of Breath   Amoxicillin Rash   Codeine Itching   Contrast Media [Iodinated Contrast Media] Rash    Rash after cardiac CT   Lisinopril Other (See Comments)    cough   Doxycycline Nausea And Vomiting    Not a true allergy   Erythromycin Nausea And Vomiting   Iohexol Rash   Neomycin Nausea And Vomiting    Past Medical History:  Diagnosis Date   Allergic rhinitis due to pollen    Chronic cystitis    Depression    Dyspareunia in female    Dyspnea on exertion    per pt hard to recover after exercise (cardiologist has echo and cardiac CT ordered when schedule is opened up)   Essential hypertension    cardioloigst-  dr t. Duke Salvia-- FH premature CAD--- 08-18-2014 normal stress echo   Foot pain 02/15/2022   History of chronic bronchitis    History of HPV infection    remote   History of kidney stones    History of recurrent UTIs    Pure hypercholesterolemia 09/20/2018   Renal calculus, left    per pt non-obstructive    Blood pressure 118/68.    Essential hypertension Patient with essential hypertension, doing well in the office today.  She is going to continue with regular home checks and average the readings weekly, with the goal to be < 130/80.  Reviewed technique in the office today and she will bring her home cuff to next appointment for validation.  She was encouraged to start exercise.  I will see her back in 3 months for follow up, but did advise that she call should home  averages consistently be above goal.     Phillips Hay PharmD CPP [facility] [address] [place], [place] [address] [phone]

## 2022-03-17 NOTE — Assessment & Plan Note (Signed)
Patient with essential hypertension, doing well in the office today.  She is going to continue with regular home checks and average the readings weekly, with the goal to be < 130/80.  Reviewed technique in the office today and she will bring her home cuff to next appointment for validation.  She was encouraged to start exercise.  I will see her back in 3 months for follow up, but did advise that she call should home averages consistently be above goal.

## 2022-03-20 ENCOUNTER — Other Ambulatory Visit (HOSPITAL_BASED_OUTPATIENT_CLINIC_OR_DEPARTMENT_OTHER): Payer: Self-pay | Admitting: Family

## 2022-03-20 DIAGNOSIS — E785 Hyperlipidemia, unspecified: Secondary | ICD-10-CM

## 2022-03-21 NOTE — Telephone Encounter (Signed)
Rx(s) sent to pharmacy electronically.  

## 2022-03-23 ENCOUNTER — Other Ambulatory Visit (HOSPITAL_BASED_OUTPATIENT_CLINIC_OR_DEPARTMENT_OTHER): Payer: Self-pay | Admitting: Obstetrics & Gynecology

## 2022-03-23 DIAGNOSIS — N926 Irregular menstruation, unspecified: Secondary | ICD-10-CM

## 2022-03-23 DIAGNOSIS — Z01818 Encounter for other preprocedural examination: Secondary | ICD-10-CM

## 2022-04-01 NOTE — Progress Notes (Signed)
Email from pt this am. Wants to do evening PREP class that will start in January. Advised will contact her closer to start of class for initial coaching and measurements

## 2022-04-08 ENCOUNTER — Encounter (HOSPITAL_BASED_OUTPATIENT_CLINIC_OR_DEPARTMENT_OTHER): Payer: Self-pay | Admitting: Obstetrics & Gynecology

## 2022-04-08 ENCOUNTER — Other Ambulatory Visit (HOSPITAL_BASED_OUTPATIENT_CLINIC_OR_DEPARTMENT_OTHER): Payer: Self-pay | Admitting: Obstetrics & Gynecology

## 2022-04-08 ENCOUNTER — Ambulatory Visit (INDEPENDENT_AMBULATORY_CARE_PROVIDER_SITE_OTHER): Payer: 59 | Admitting: Obstetrics & Gynecology

## 2022-04-08 VITALS — BP 127/86 | HR 81 | Ht 66.0 in | Wt 146.8 lb

## 2022-04-08 DIAGNOSIS — N926 Irregular menstruation, unspecified: Secondary | ICD-10-CM

## 2022-04-08 DIAGNOSIS — Z01419 Encounter for gynecological examination (general) (routine) without abnormal findings: Secondary | ICD-10-CM

## 2022-04-08 DIAGNOSIS — I10 Essential (primary) hypertension: Secondary | ICD-10-CM | POA: Diagnosis not present

## 2022-04-08 DIAGNOSIS — N941 Unspecified dyspareunia: Secondary | ICD-10-CM | POA: Diagnosis not present

## 2022-04-08 DIAGNOSIS — Z975 Presence of (intrauterine) contraceptive device: Secondary | ICD-10-CM

## 2022-04-08 NOTE — Progress Notes (Signed)
46 y.o. G67P1102 Married White or Caucasian female here for annual exam.  Doing well except having a lot of irregular spotting with her IUD.  Only had two months of basically no bleeding.  She's has spotting that has lasted up to 14.  Hysteroscopy with possible polyp removal, D&C, IUD removal replacement planned and scheduled for 04/27/2022.  Procedure discussed with patient.  Recovery and pain management discussed.  Risks discussed including but not limited to bleeding, rare risk of transfusion, infection, 1% risk of uterine perforation with risks of fluid deficit causing cardiac arrythmia, cerebral swelling and/or need to stop procedure early.  Fluid emboli and rare risk of death discussed.  DVT/PE, rare risk of risk of bowel/bladder/ureteral/vascular injury.  Patient aware if pathology abnormal she may need additional treatment.  All questions answered.     No LMP recorded. (Menstrual status: IUD).          Sexually active: Yes.    The current method of family planning is IUD.    Smoker:  no  Health Maintenance: Pap:  03/31/2021 Negative History of abnormal Pap:  no MMG:  03/24/2021 Negative Colonoscopy:  02/21/2020, follow up 10 years Screening Labs: done 01/2022   reports that she has never smoked. She has never used smokeless tobacco. She reports current alcohol use. She reports that she does not use drugs.  Past Medical History:  Diagnosis Date   Allergic rhinitis due to pollen    Chronic cystitis    Depression    Dyspareunia in female    Dyspnea on exertion    per pt hard to recover after exercise (cardiologist has echo and cardiac CT ordered when schedule is opened up)   Essential hypertension    cardioloigst-  dr t. Duke Salvia-- FH premature CAD--- 08-18-2014 normal stress echo   Foot pain 02/15/2022   History of chronic bronchitis    History of HPV infection    remote   History of kidney stones    History of recurrent UTIs    Pure hypercholesterolemia 09/20/2018   Renal  calculus, left    per pt non-obstructive    Past Surgical History:  Procedure Laterality Date   CESAREAN SECTION  2004, 2005   CYSTOSCOPY N/A 11/06/2018   Procedure: CYSTOSCOPY;  Surgeon: Jerene Bears, MD;  Location: [facility];  Service: Gynecology;  Laterality: N/A;   INTRAUTERINE DEVICE (IUD) INSERTION N/A 11/06/2018   Procedure: INTRAUTERINE DEVICE (IUD) INSERTION WITH ULTRASOUND GUIDANCE;  Surgeon: Jerene Bears, MD;  Location: [facility];  Service: Gynecology;  Laterality: N/A;   IUD REMOVAL N/A 11/06/2018   Procedure: INTRAUTERINE DEVICE (IUD) REMOVAL;  Surgeon: Jerene Bears, MD;  Location: [facility];  Service: Gynecology;  Laterality: N/A;  Mirena IUD removal and reinsertion with ultrasound guidance.   KNEE ARTHROSCOPY Right 1995   with left knee realignment   KNEE SURGERY Left 1991   NASAL SINUS SURGERY  2015   wtih septoplasty   SEPTOPLASTY     TONSILLECTOMY  1987    Current Outpatient Medications  Medication Sig Dispense Refill   albuterol (PROVENTIL HFA;VENTOLIN HFA) 108 (90 Base) MCG/ACT inhaler Inhale 2 puffs into the lungs every 6 (six) hours as needed.     ALPRAZolam (XANAX) 0.25 MG tablet Take 0.5-1 tablets (0.125-0.25 mg total) by mouth 2 (two) times daily as needed for anxiety. 180 tablet 1   azelastine (ASTELIN) 0.1 % nasal spray Place into both nostrils 2 (two) times daily as needed.  Use in each nostril as directed     buPROPion (WELLBUTRIN XL) 300 MG 24 hr tablet Take 1 tablet (300 mg total) by mouth daily. 90 tablet 1   cetirizine (ZYRTEC) 10 MG tablet Take 10 mg by mouth at bedtime.     clindamycin-benzoyl peroxide (BENZACLIN) gel      EPINEPHrine 0.3 mg/0.3 mL IJ SOAJ injection      estradiol (ESTRACE) 0.1 MG/GM vaginal cream 1 gram vaginally twice weekly 42.5 g 6   Eszopiclone 3 MG TABS Take 1 tablet (3 mg total) by mouth at bedtime as needed. Take immediately before bedtime 90 tablet 1   FLOVENT HFA 44 MCG/ACT  inhaler Inhale 2 puffs into the lungs 2 (two) times daily.     fluticasone (FLONASE SENSIMIST) 27.5 MCG/SPRAY nasal spray      L-Lysine HCl 1000 MG TABS Take by mouth as needed.     Spacer/Aero-Holding Chambers [facility] DIAMOND) MISC See admin instructions.     valACYclovir (VALTREX) 500 MG tablet Take 1,000 mg by mouth 3 (three) times daily as needed.      valsartan-hydrochlorothiazide (DIOVAN HCT) 160-25 MG tablet Take 1 tablet by mouth daily. 90 tablet 3   No current facility-administered medications for this visit.    Family History  Problem Relation Age of Onset   Hypertension Mother    Hyperlipidemia Mother    Rheum arthritis Mother    Sjogren's syndrome Mother    Hypertension Father    Hyperlipidemia Father    Coronary artery disease Father        MI 68s, smoked, CABG, MULTIPLE STENTS   Colon cancer Maternal Grandfather    Bipolar disorder Sister    Alcohol abuse Sister    Depression Sister    Rheum arthritis Sister    Obesity Sister    Parkinson's disease Maternal Grandmother    Alzheimer's disease Paternal Grandmother     ROS: Constitutional: negative Genitourinary:negative  Exam:   BP 127/86 (BP Location: Right Arm, Patient Position: Sitting, Cuff Size: Normal)   Pulse 81   Ht 5\' 6"  (1.676 m) Comment: reported  Wt 146 lb 12.8 oz (66.6 kg)   BMI 23.69 kg/m   Height: 5\' 6"  (167.6 cm) (reported)  General appearance: alert, cooperative and appears stated age Head: Normocephalic, without obvious abnormality, atraumatic Neck: no adenopathy, supple, symmetrical, trachea midline and thyroid normal to inspection and palpation Lungs: clear to auscultation bilaterally Breasts: normal appearance, no masses or tenderness Heart: regular rate and rhythm Abdomen: soft, non-tender; bowel sounds normal; no masses,  no organomegaly Extremities: extremities normal, atraumatic, no cyanosis or edema Skin: Skin color, texture, turgor normal. No rashes or lesions Lymph nodes:  Cervical, supraclavicular, and axillary nodes normal. No abnormal inguinal nodes palpated Neurologic: Grossly normal   Pelvic: External genitalia:  no lesions              Urethra:  normal appearing urethra with no masses, tenderness or lesions              Bartholins and Skenes: normal                 Vagina: normal appearing vagina with normal color and no discharge, no lesions              Cervix: no lesions              Pap taken: No. Bimanual Exam:  Uterus:  normal size, contour, position, consistency, mobility, non-tender  Adnexa: normal adnexa and no mass, fullness, tenderness               Rectovaginal: Confirms               Anus:  normal sphincter tone, no lesions  Chaperone, Ina Homes, CMA, was present for exam.  Assessment/Plan: 1. Well woman exam with routine gynecological exam - Pap smear 2022 - Mammogram done 02/2021 - Colonoscopy 2021.  Follow up 10 years - lab work done 01/2022 - vaccines reviewed/updated  2. Essential hypertension - on treatment  3. Irregular bleeding - hysteroscopy with possible polyp resection, D&C is planned.  Will remove and replaced IUD  4. Dyspareunia, female - uses vaginal estrogen cream  5. IUD (intrauterine device) in place

## 2022-04-19 ENCOUNTER — Encounter (HOSPITAL_BASED_OUTPATIENT_CLINIC_OR_DEPARTMENT_OTHER): Payer: Self-pay | Admitting: Obstetrics & Gynecology

## 2022-04-19 NOTE — Progress Notes (Signed)
Spoke w/ via phone for pre-op interview--- [facility] Lab needs dos----    UPT and CBC per surgeon. ISTAT per anesthesia           Lab results------Current EKG in Epic dated 01/2022. COVID test -----patient states asymptomatic no test needed Arrive at -------0530 NPO after MN NO Solid Food.   Med rec completed Medications to take morning of surgery ----- Bring Albuterol, Xanax PRN, Wellbutrin and Flovent Diabetic medication ----- Patient instructed no nail polish to be worn day of surgery Patient instructed to bring photo id and insurance card day of surgery Patient aware to have Driver (ride ) / caregiver   husband Mardelle Matte for 24 hours after surgery  Patient Special Instructions ----- Pre-Op special Istructions ----- Patient verbalized understanding of instructions that were given at this phone interview. Patient denies shortness of breath, chest pain, fever, cough at this phone interview.  Cardiologist Dr Duke Salvia, LOV 9/23 needs to f/u in 6 months.

## 2022-04-26 NOTE — Anesthesia Preprocedure Evaluation (Addendum)
Anesthesia Evaluation  Patient identified by MRN, date of birth, ID band Patient awake    Reviewed: Allergy & Precautions, NPO status , Patient's Chart, lab work & pertinent test results  Airway Mallampati: I  TM Distance: >3 FB Neck ROM: Full    Dental no notable dental hx.    Pulmonary shortness of breath and with exertion, asthma    Pulmonary exam normal        Cardiovascular hypertension, Pt. on medications  Rhythm:Regular Rate:Normal     Neuro/Psych   Anxiety Depression    negative neurological ROS     GI/Hepatic negative GI ROS, Neg liver ROS,,,  Endo/Other  negative endocrine ROS    Renal/GU   Female GU complaint AUB    Musculoskeletal negative musculoskeletal ROS (+)    Abdominal Normal abdominal exam  (+)   Peds  Hematology negative hematology ROS (+)   Anesthesia Other Findings   Reproductive/Obstetrics                             Anesthesia Physical Anesthesia Plan  ASA: 2  Anesthesia Plan: General   Post-op Pain Management: Celebrex PO (pre-op)* and Tylenol PO (pre-op)*   Induction: Intravenous  PONV Risk Score and Plan: 3 and Ondansetron, Dexamethasone, Midazolam, Treatment may vary due to age or medical condition and Scopolamine patch - Pre-op  Airway Management Planned: Mask and LMA  Additional Equipment: None  Intra-op Plan:   Post-operative Plan: Extubation in OR  Informed Consent: I have reviewed the patients History and Physical, chart, labs and discussed the procedure including the risks, benefits and alternatives for the proposed anesthesia with the patient or authorized representative who has indicated his/her understanding and acceptance.     Dental advisory given  Plan Discussed with: CRNA  Anesthesia Plan Comments: (Lab Results      Component                Value               Date                      PREGTESTUR               NEGATIVE             04/27/2022                HCG                      <5.0                01/30/2019           )       Anesthesia Quick Evaluation

## 2022-04-27 ENCOUNTER — Ambulatory Visit (HOSPITAL_BASED_OUTPATIENT_CLINIC_OR_DEPARTMENT_OTHER): Payer: 59 | Admitting: Anesthesiology

## 2022-04-27 ENCOUNTER — Ambulatory Visit (HOSPITAL_BASED_OUTPATIENT_CLINIC_OR_DEPARTMENT_OTHER)
Admission: RE | Admit: 2022-04-27 | Discharge: 2022-04-27 | Disposition: A | Discharge status: Home / Self Care | Payer: 59 | Attending: Obstetrics & Gynecology | Admitting: Obstetrics & Gynecology

## 2022-04-27 ENCOUNTER — Other Ambulatory Visit: Payer: Self-pay

## 2022-04-27 ENCOUNTER — Encounter (HOSPITAL_BASED_OUTPATIENT_CLINIC_OR_DEPARTMENT_OTHER)
Admission: RE | Disposition: A | Discharge status: Home / Self Care | Payer: Self-pay | Source: Home / Self Care | Attending: Obstetrics & Gynecology

## 2022-04-27 ENCOUNTER — Encounter (HOSPITAL_BASED_OUTPATIENT_CLINIC_OR_DEPARTMENT_OTHER): Payer: Self-pay | Admitting: Obstetrics & Gynecology

## 2022-04-27 DIAGNOSIS — N926 Irregular menstruation, unspecified: Secondary | ICD-10-CM

## 2022-04-27 DIAGNOSIS — N939 Abnormal uterine and vaginal bleeding, unspecified: Secondary | ICD-10-CM | POA: Diagnosis present

## 2022-04-27 DIAGNOSIS — Z01818 Encounter for other preprocedural examination: Secondary | ICD-10-CM

## 2022-04-27 DIAGNOSIS — N840 Polyp of corpus uteri: Secondary | ICD-10-CM

## 2022-04-27 DIAGNOSIS — N841 Polyp of cervix uteri: Secondary | ICD-10-CM

## 2022-04-27 DIAGNOSIS — Z30433 Encounter for removal and reinsertion of intrauterine contraceptive device: Secondary | ICD-10-CM | POA: Insufficient documentation

## 2022-04-27 DIAGNOSIS — I10 Essential (primary) hypertension: Secondary | ICD-10-CM | POA: Diagnosis not present

## 2022-04-27 DIAGNOSIS — Z30432 Encounter for removal of intrauterine contraceptive device: Secondary | ICD-10-CM

## 2022-04-27 HISTORY — PX: IUD REMOVAL: SHX5392

## 2022-04-27 HISTORY — DX: Anxiety disorder, unspecified: F41.9

## 2022-04-27 HISTORY — PX: HYSTEROSCOPY WITH D & C: SHX1775

## 2022-04-27 HISTORY — PX: INTRAUTERINE DEVICE (IUD) INSERTION: SHX5877

## 2022-04-27 LAB — POCT I-STAT, CHEM 8
BUN: 14 mg/dL (ref 6–20)
Calcium, Ion: 1.17 mmol/L (ref 1.15–1.40)
Chloride: 101 mmol/L (ref 98–111)
Creatinine, Ser: 0.9 mg/dL (ref 0.44–1.00)
Glucose, Bld: 97 mg/dL (ref 70–99)
HCT: 38 % (ref 36.0–46.0)
Hemoglobin: 12.9 g/dL (ref 12.0–15.0)
Potassium: 3.5 mmol/L (ref 3.5–5.1)
Sodium: 140 mmol/L (ref 135–145)
TCO2: 27 mmol/L (ref 22–32)

## 2022-04-27 LAB — CBC
HCT: 38.2 % (ref 36.0–46.0)
Hemoglobin: 13.5 g/dL (ref 12.0–15.0)
MCH: 31 pg (ref 26.0–34.0)
MCHC: 35.3 g/dL (ref 30.0–36.0)
MCV: 87.6 fL (ref 80.0–100.0)
Platelets: 269 10*3/uL (ref 150–400)
RBC: 4.36 MIL/uL (ref 3.87–5.11)
RDW: 12.2 % (ref 11.5–15.5)
WBC: 6.2 10*3/uL (ref 4.0–10.5)
nRBC: 0 % (ref 0.0–0.2)

## 2022-04-27 LAB — POCT PREGNANCY, URINE: Preg Test, Ur: NEGATIVE

## 2022-04-27 SURGERY — DILATATION AND CURETTAGE /HYSTEROSCOPY
Anesthesia: General | Site: Uterus

## 2022-04-27 MED ORDER — LIDOCAINE HCL (PF) 2 % IJ SOLN
INTRAMUSCULAR | Status: AC
  Filled 2022-04-27: qty 5

## 2022-04-27 MED ORDER — OXYCODONE HCL 5 MG PO TABS: 5.0000 mg | ORAL_TABLET | Freq: Once | ORAL | Status: DC | PRN

## 2022-04-27 MED ORDER — OXYCODONE HCL 5 MG/5ML PO SOLN: 5.0000 mg | Freq: Once | ORAL | Status: DC | PRN

## 2022-04-27 MED ORDER — IBUPROFEN 800 MG PO TABS: 800.0000 mg | ORAL_TABLET | Freq: Three times a day (TID) | ORAL | 0 refills | Status: DC | PRN

## 2022-04-27 MED ORDER — POVIDONE-IODINE 10 % EX SWAB: 2.0000 | Freq: Once | CUTANEOUS | Status: DC

## 2022-04-27 MED ORDER — FENTANYL CITRATE (PF) 100 MCG/2ML IJ SOLN: 25.0000 ug | INTRAMUSCULAR | Status: DC | PRN

## 2022-04-27 MED ORDER — PROPOFOL 10 MG/ML IV BOLUS
INTRAVENOUS | Status: AC
  Filled 2022-04-27: qty 20

## 2022-04-27 MED ORDER — LACTATED RINGERS IV SOLN: INTRAVENOUS | Status: DC

## 2022-04-27 MED ORDER — SODIUM CHLORIDE 0.9 % IR SOLN
Status: DC | PRN
  Administered 2022-04-27: 3000 mL

## 2022-04-27 MED ORDER — FENTANYL CITRATE (PF) 100 MCG/2ML IJ SOLN
INTRAMUSCULAR | Status: AC
  Filled 2022-04-27: qty 2

## 2022-04-27 MED ORDER — HYDROCODONE-ACETAMINOPHEN 5-325 MG PO TABS: 1.0000 | ORAL_TABLET | Freq: Four times a day (QID) | ORAL | 0 refills | Status: DC | PRN

## 2022-04-27 MED ORDER — CELECOXIB 200 MG PO CAPS
ORAL_CAPSULE | ORAL | Status: AC
  Filled 2022-04-27: qty 1

## 2022-04-27 MED ORDER — MIDAZOLAM HCL 5 MG/5ML IJ SOLN
INTRAMUSCULAR | Status: DC | PRN
  Administered 2022-04-27: 2 mg via INTRAVENOUS

## 2022-04-27 MED ORDER — LEVONORGESTREL 20 MCG/DAY IU IUD
1.0000 | INTRAUTERINE_SYSTEM | INTRAUTERINE | Status: AC
  Administered 2022-04-27: 1 via INTRAUTERINE

## 2022-04-27 MED ORDER — ACETAMINOPHEN 500 MG PO TABS
ORAL_TABLET | ORAL | Status: AC
  Filled 2022-04-27: qty 2

## 2022-04-27 MED ORDER — ONDANSETRON HCL 4 MG/2ML IJ SOLN
INTRAMUSCULAR | Status: AC
  Filled 2022-04-27: qty 2

## 2022-04-27 MED ORDER — LEVONORGESTREL 20 MCG/DAY IU IUD
INTRAUTERINE_SYSTEM | INTRAUTERINE | Status: AC
  Filled 2022-04-27: qty 1

## 2022-04-27 MED ORDER — CELECOXIB 200 MG PO CAPS
200.0000 mg | ORAL_CAPSULE | Freq: Once | ORAL | Status: AC
  Administered 2022-04-27: 200 mg via ORAL

## 2022-04-27 MED ORDER — SCOPOLAMINE 1 MG/3DAYS TD PT72
1.0000 | MEDICATED_PATCH | TRANSDERMAL | Status: DC
  Administered 2022-04-27: 1.5 mg via TRANSDERMAL

## 2022-04-27 MED ORDER — LIDOCAINE-EPINEPHRINE 1 %-1:100000 IJ SOLN
INTRAMUSCULAR | Status: DC | PRN
  Administered 2022-04-27: 10 mL

## 2022-04-27 MED ORDER — DEXAMETHASONE SODIUM PHOSPHATE 10 MG/ML IJ SOLN
INTRAMUSCULAR | Status: AC
  Filled 2022-04-27: qty 1

## 2022-04-27 MED ORDER — DEXAMETHASONE SODIUM PHOSPHATE 10 MG/ML IJ SOLN
INTRAMUSCULAR | Status: DC | PRN
  Administered 2022-04-27: 5 mg via INTRAVENOUS

## 2022-04-27 MED ORDER — SCOPOLAMINE 1 MG/3DAYS TD PT72
MEDICATED_PATCH | TRANSDERMAL | Status: AC
  Filled 2022-04-27: qty 1

## 2022-04-27 MED ORDER — ACETAMINOPHEN 500 MG PO TABS
1000.0000 mg | ORAL_TABLET | Freq: Once | ORAL | Status: AC
  Administered 2022-04-27: 1000 mg via ORAL

## 2022-04-27 MED ORDER — KETOROLAC TROMETHAMINE 30 MG/ML IJ SOLN: INTRAMUSCULAR | Status: DC | PRN

## 2022-04-27 MED ORDER — FENTANYL CITRATE (PF) 100 MCG/2ML IJ SOLN
INTRAMUSCULAR | Status: DC | PRN
  Administered 2022-04-27 (×2): 50 ug via INTRAVENOUS

## 2022-04-27 MED ORDER — MIDAZOLAM HCL 2 MG/2ML IJ SOLN
INTRAMUSCULAR | Status: AC
  Filled 2022-04-27: qty 2

## 2022-04-27 MED ORDER — PROPOFOL 10 MG/ML IV BOLUS
INTRAVENOUS | Status: DC | PRN
  Administered 2022-04-27: 20 mg via INTRAVENOUS
  Administered 2022-04-27: 150 mg via INTRAVENOUS

## 2022-04-27 MED ORDER — LIDOCAINE 2% (20 MG/ML) 5 ML SYRINGE
INTRAMUSCULAR | Status: DC | PRN
  Administered 2022-04-27: 60 mg via INTRAVENOUS

## 2022-04-27 MED ORDER — EPHEDRINE SULFATE-NACL 50-0.9 MG/10ML-% IV SOSY
PREFILLED_SYRINGE | INTRAVENOUS | Status: DC | PRN
  Administered 2022-04-27 (×2): 10 mg via INTRAVENOUS

## 2022-04-27 MED ORDER — ONDANSETRON HCL 4 MG/2ML IJ SOLN
INTRAMUSCULAR | Status: DC | PRN
  Administered 2022-04-27: 4 mg via INTRAVENOUS

## 2022-04-27 SURGICAL SUPPLY — 25 items
CATH ROBINSON RED A/P 16FR (CATHETERS) ×1 IMPLANT
DEVICE MYOSURE LITE (MISCELLANEOUS) IMPLANT
DEVICE MYOSURE REACH (MISCELLANEOUS) IMPLANT
DILATOR CANAL MILEX (MISCELLANEOUS) IMPLANT
DRSG TELFA 3X8 NADH STRL (GAUZE/BANDAGES/DRESSINGS) ×1 IMPLANT
GAUZE 4X4 16PLY ~~LOC~~+RFID DBL (SPONGE) ×2 IMPLANT
GLOVE BIO SURGEON STRL SZ 6.5 (GLOVE) ×1 IMPLANT
GLOVE BIOGEL PI IND STRL 7.0 (GLOVE) ×3 IMPLANT
GLOVE ECLIPSE 6.5 STRL STRAW (GLOVE) ×2 IMPLANT
GOWN STRL REUS W/TWL LRG LVL3 (GOWN DISPOSABLE) ×2 IMPLANT
HIBICLENS CHG 4% 4OZ BTL (MISCELLANEOUS) ×1 IMPLANT
IV NS IRRIG 3000ML ARTHROMATIC (IV SOLUTION) ×1 IMPLANT
KIT PROCEDURE FLUENT (KITS) ×1 IMPLANT
KIT TURNOVER CYSTO (KITS) ×1 IMPLANT
LOOP CUTTING BIPOLAR 21FR (ELECTRODE) IMPLANT
NDL SPNL 22GX3.5 QUINCKE BK (NEEDLE) ×1 IMPLANT
NEEDLE SPNL 22GX3.5 QUINCKE BK (NEEDLE) ×1 IMPLANT
PACK VAGINAL MINOR WOMEN LF (CUSTOM PROCEDURE TRAY) ×1 IMPLANT
PAD OB MATERNITY 4.3X12.25 (PERSONAL CARE ITEMS) ×1 IMPLANT
PAD PREP 24X48 CUFFED NSTRL (MISCELLANEOUS) ×1 IMPLANT
SEAL CERVICAL OMNI LOK (ABLATOR) IMPLANT
SEAL ROD LENS SCOPE MYOSURE (ABLATOR) ×1 IMPLANT
TOWEL OR 17X26 10 PK STRL BLUE (TOWEL DISPOSABLE) ×2 IMPLANT
WATER STERILE IRR 500ML POUR (IV SOLUTION) ×1 IMPLANT
mirena IUD IMPLANT

## 2022-04-27 NOTE — Progress Notes (Signed)
Patient had small amount of bleeding when RN took her to the bathroom.  Patient was able to urinate.

## 2022-04-27 NOTE — Transfer of Care (Signed)
Immediate Anesthesia Transfer of Care Note  Patient: Shelley Figueroa  Procedure(s) Performed: DILATATION AND CURETTAGE /HYSTEROSCOPY/MYOSURE POLYP RESECTION (Uterus) INTRAUTERINE DEVICE (IUD) REMOVAL (Uterus) INTRAUTERINE DEVICE (IUD) INSERTION (Uterus)  Patient Location: PACU  Anesthesia Type:General  Level of Consciousness: drowsy  Airway & Oxygen Therapy: Patient Spontanous Breathing and Patient connected to face mask oxygen  Post-op Assessment: Report given to RN and Post -op Vital signs reviewed and stable  Post vital signs: Reviewed and stable  Last Vitals:  Vitals Value Taken Time  BP 115/72 04/27/22 0821  Temp 36.4 C 04/27/22 0821  Pulse 71 04/27/22 0825  Resp 8 04/27/22 0825  SpO2 100 % 04/27/22 0825  Vitals shown include unvalidated device data.  Last Pain:  Vitals:   04/27/22 0607  TempSrc: Oral  PainSc: 0-No pain      Patients Stated Pain Goal: 3 (04/27/22 9702)  Complications: No notable events documented.

## 2022-04-27 NOTE — H&P (Signed)
Shelley Figueroa is an 46 y.o. female G2P2 MWF with hx of irregular bleeding with IUD here for additional evaluation, IUD removal and replacement.  Pt had IUD placed in office of another provider several years ago and it was very difficult and painful for patient.  So, any procedure involving her IUD, she desires to do this in the operating room.  As she is desirous of having her IUD removed and replaced, we also discussed proceeding with hysteroscopy and D&C as well as removal of any abnormal findings.  She did have an ultrasound in 2021 showing an cervical polyp.  This was not repeated as it has not been removed and is likely the cause of her irregular bleeding.  Due to continued irregular bleeding, she has decided to proceed with removal of this lesion with hysteroscopy and IUD removal replacement.  Risks and benefits have been discussed and were done at her 04/18/2022 appointment.  Questions answered today as well.  Pertinent Gynecological History: Menses:  irregular spotting Contraception: IUD DES exposure: denies Blood transfusions: none Sexually transmitted diseases: no past history Previous GYN Procedures:  IUD removal and replacment in OR in 2020   Last mammogram: normal Date: 02/2021 Last pap: normal Date: 03/2021 with neg HR HPV OB History: G2, P2   Menstrual History: No LMP recorded. (Menstrual status: IUD).    Past Medical History:  Diagnosis Date   Allergic rhinitis due to pollen    Anxiety    Chronic cystitis    Depression    Dyspareunia in female    Dyspnea on exertion    per pt hard to recover after exercise (cardiologist has echo and cardiac CT ordered when schedule is opened up)   Essential hypertension    cardioloigst-  dr t. Duke Salvia-- FH premature CAD--- 08-18-2014 normal stress echo   Foot pain 02/15/2022   History of chronic bronchitis    History of HPV infection    remote   History of kidney stones    History of recurrent UTIs    Pure hypercholesterolemia  09/20/2018   Renal calculus, left    per pt non-obstructive    Past Surgical History:  Procedure Laterality Date   CESAREAN SECTION  2004, 2005   CYSTOSCOPY N/A 11/06/2018   Procedure: CYSTOSCOPY;  Surgeon: Jerene Bears, MD;  Location: [facility];  Service: Gynecology;  Laterality: N/A;   INTRAUTERINE DEVICE (IUD) INSERTION N/A 11/06/2018   Procedure: INTRAUTERINE DEVICE (IUD) INSERTION WITH ULTRASOUND GUIDANCE;  Surgeon: Jerene Bears, MD;  Location: [facility];  Service: Gynecology;  Laterality: N/A;   IUD REMOVAL N/A 11/06/2018   Procedure: INTRAUTERINE DEVICE (IUD) REMOVAL;  Surgeon: Jerene Bears, MD;  Location: [facility];  Service: Gynecology;  Laterality: N/A;  Mirena IUD removal and reinsertion with ultrasound guidance.   KNEE ARTHROSCOPY Right 1995   with left knee realignment   KNEE SURGERY Left 1991   NASAL SINUS SURGERY  2015   wtih septoplasty   SEPTOPLASTY     TONSILLECTOMY  1987    Family History  Problem Relation Age of Onset   Hypertension Mother    Hyperlipidemia Mother    Rheum arthritis Mother    Sjogren's syndrome Mother    Hypertension Father    Hyperlipidemia Father    Coronary artery disease Father        MI 63s, smoked, CABG, MULTIPLE STENTS   Colon cancer Maternal Grandfather    Bipolar disorder Sister  Alcohol abuse Sister    Depression Sister    Rheum arthritis Sister    Obesity Sister    Parkinson's disease Maternal Grandmother    Alzheimer's disease Paternal Grandmother     Social History:  reports that she has never smoked. She has never used smokeless tobacco. She reports current alcohol use. She reports that she does not use drugs.  Allergies:  Allergies  Allergen Reactions   Penicillins Hives and Shortness Of Breath   Clindamycin Nausea And Vomiting   Codeine Itching   Contrast Media [Iodinated Contrast Media] Rash    Rash after cardiac CT   Lisinopril Other (See Comments)    cough    Doxycycline Nausea And Vomiting    Not a true allergy   Erythromycin Nausea And Vomiting   Iohexol Rash   Neomycin Nausea And Vomiting    No medications prior to admission.    Review of Systems  Constitutional: Negative.   Respiratory: Negative.    Cardiovascular: Negative.   Genitourinary:        Irregular bleeding    Height 5\' 6"  (1.676 m), weight 68 kg. Physical Exam Constitutional:      Appearance: Normal appearance.  Cardiovascular:     Rate and Rhythm: Normal rate and regular rhythm.     Heart sounds: Normal heart sounds.  Pulmonary:     Effort: Pulmonary effort is normal.     Breath sounds: Normal breath sounds.  Neurological:     General: No focal deficit present.     Mental Status: She is alert.  Psychiatric:        Mood and Affect: Mood normal.     No results found for this or any previous visit (from the past 24 hour(s)).  No results found.  Assessment/Plan: 46 yo G2P2 MWF with hx of cervical polyp and irregular bleeding here for hysteroscopy with D&C and possible polyp removal and IUD removal and replacement.  Questions answered.  Pt ready to proceed.  52 04/27/2022, 4:28 AM

## 2022-04-27 NOTE — Op Note (Signed)
04/27/2022  8:23 AM  PATIENT:  Shelley Figueroa  46 y.o. female  PRE-OPERATIVE DIAGNOSIS:  AUB, endocervical polyp, IUD use   POST-OPERATIVE DIAGNOSIS:  AUB, endocervical polyp, endometrial polyps, IUD use  PROCEDURE:  Procedure(s): DILATATION AND CURETTAGE /HYSTEROSCOPY/MYOSURE POLYP RESECTION INTRAUTERINE DEVICE (IUD) REMOVAL INTRAUTERINE DEVICE (IUD) INSERTION  SURGEON:  Jerene Bears  ASSISTANTS: OR staff.    ANESTHESIA:   general  ESTIMATED BLOOD LOSS: 10cc  BLOOD ADMINISTERED:none   FLUIDS: 700cc LR  UOP: pt voided before going back to the OR  SPECIMEN:  endocervical and endometrial polyps, endometrial curettings  DISPOSITION OF SPECIMEN:  PATHOLOGY  FINDINGS: at least four endometrial polyps, one endocervical polyp, otherwise normal endometrial cavity noted  DESCRIPTION OF OPERATION: Patient was taken to the operating room.  She is placed in the supine position. SCDs were on her lower extremities and functioning properly. General anesthesia with an LMA was administered without difficulty. Dr. Curt Jews, anesthesia, oversaw case.  Legs were then placed in the [facility] stirrups in the low lithotomy position. The legs were lifted to the high lithotomy position and the Betadine prep was used on the inner thighs perineum and vagina x3. Patient was draped in a normal standard fashion. Pt voided before going back to the OR so no I&O cath was performed.  A bivalve speculum was placed the vagina. IUD string was noted.  IUD string grasped with ringed forceps.  With one pull, IUD was removed intact and then discarded.  Then the anterior lip of the cervix was grasped with single-tooth tenaculum.  A paracervical block of 1% lidocaine mixed one-to-one with epinephrine (1:100,000 units).  10 cc was used total. The cervix is dilated up to #6.5 Hagar dilators. The endometrial cavity sounded to 9 cm.   A Myosure diagnostic hysteroscope was obtained. Normal saline was used as a  hysteroscopic fluid. The hysteroscope was advanced through the endocervical canal into the endometrial cavity. The tubal ostia were noted bilaterally. Additional findings included several endometrial polyps as well as an endocervical polyp.  Using the [facility] lite device, the polyps were resected.  Then the hysteroscope was removed. A #1 toothed curette was used to curette the cavity until rough gritty texture was noted in all quadrants. With revisualization of the hysteroscope, there was no longer any abnormal findings.  A second curetting was then performed.  Then the uterus was sounded again to 9cm.  A mirena IUD with inserted was passed to the uterine fundus, withdrawn slightly and then IUD released.  The introducer was removed.  The IUD string was cut to 2cm.  At this point no other procedure was needed and this procedure was ended. The hysteroscope was removed. The fluid deficit was 45 cc. The tenaculum was removed from the anterior lip of the cervix. The speculum was removed from the vagina. The prep was cleansed of the patient's skin. The legs are positioned back in the supine position. Sponge, lap, needle, instrument counts were correct x2. Patient was taken to recovery in stable condition.  COUNTS:  YES  PLAN OF CARE: Transfer to PACU

## 2022-04-27 NOTE — Anesthesia Procedure Notes (Signed)
Procedure Name: LMA Insertion Date/Time: 04/27/2022 7:39 AM  Performed by: Bishop Limbo, CRNAPre-anesthesia Checklist: Patient identified, Emergency Drugs available, Suction available and Patient being monitored Patient Re-evaluated:Patient Re-evaluated prior to induction Oxygen Delivery Method: Circle System Utilized Preoxygenation: Pre-oxygenation with 100% oxygen Induction Type: IV induction Ventilation: Mask ventilation without difficulty LMA: LMA inserted LMA Size: 3.0 Number of attempts: 1 Placement Confirmation: positive ETCO2 Tube secured with: Tape Dental Injury: Teeth and Oropharynx as per pre-operative assessment

## 2022-04-27 NOTE — Discharge Instructions (Addendum)
Post-surgical Instructions, Outpatient Surgery  You may expect to feel dizzy, weak, and drowsy for as long as 24 hours after receiving the medicine that made you sleep (anesthetic). For the first 24 hours after your surgery:   Do not drive a car, ride a bicycle, participate in physical activities, or take public transportation until you are done taking narcotic pain medicines or as directed by Dr. Miller.  Do not drink alcohol or take tranquilizers.  Do not take medicine that has not been prescribed by your physicians.  Do not sign important papers or make important decisions while on narcotic pain medicines.  Have a responsible person with you.   PAIN MANAGEMENT Motrin 800mg.  (This is the same as 4-200mg over the counter tablets of Motrin or ibuprofen.)  You may take this every eight hours or as needed for cramping.   Vicodin 5/325mg.  For more severe pain, take one or two tablets every four to six hours as needed for pain control.  (Remember that narcotic pain medications increase your risk of constipation.  If this becomes a problem, you may take an over the counter stool softener like Colace 100mg up to four times a day.)  DO'S AND DON'T'S Do not take a tub bath for one week.  You may shower on the first day after your surgery Do not do any heavy lifting for one to two weeks.  This increases the chance of bleeding. Do move around as you feel able.  Stairs are fine.  You may begin to exercise again as you feel able.  Do not lift any weights for two weeks. Do not put anything in the vagina for two weeks--no tampons, intercourse, or douching.    REGULAR MEDIATIONS/VITAMINS: You may restart all of your regular medications as prescribed. You may restart all of your vitamins as you normally take them.    PLEASE CALL OR SEEK MEDICAL CARE IF: You have persistent nausea and vomiting.  You have trouble eating or drinking.  You have an oral temperature above 100.5.  You have constipation that is  not helped by adjusting diet or increasing fluid intake. Pain medicines are a common cause of constipation.  You have heavy vaginal bleeding   Post Anesthesia Home Care Instructions  Activity: Get plenty of rest for the remainder of the day. A responsible individual must stay with you for 24 hours following the procedure.  For the next 24 hours, DO NOT: -Drive a car -Operate machinery -Drink alcoholic beverages -Take any medication unless instructed by your physician -Make any legal decisions or sign important papers.  Meals: Start with liquid foods such as gelatin or soup. Progress to regular foods as tolerated. Avoid greasy, spicy, heavy foods. If nausea and/or vomiting occur, drink only clear liquids until the nausea and/or vomiting subsides. Call your physician if vomiting continues.  Special Instructions/Symptoms: Your throat may feel dry or sore from the anesthesia or the breathing tube placed in your throat during surgery. If this causes discomfort, gargle with warm salt water. The discomfort should disappear within 24 hours.  If you had a scopolamine patch placed behind your ear for the management of post- operative nausea and/or vomiting:  1. The medication in the patch is effective for 72 hours, after which it should be removed.  Wrap patch in a tissue and discard in the trash. Wash hands thoroughly with soap and water. 2. You may remove the patch earlier than 72 hours if you experience unpleasant side effects which may   include dry mouth, dizziness or visual disturbances. 3. Avoid touching the patch. Wash your hands with soap and water after contact with the patch. Remove patch by Saturday, December 2nd.    No acetaminophen/Tylenol until after 12:15 pm today if needed. No ibuprofen, Advil, Aleve, Motrin, ketorolac, meloxicam, naproxen, or other NSAIDS until after 12:15 pm today if needed.

## 2022-04-28 ENCOUNTER — Encounter (HOSPITAL_BASED_OUTPATIENT_CLINIC_OR_DEPARTMENT_OTHER): Payer: Self-pay | Admitting: Obstetrics & Gynecology

## 2022-04-28 NOTE — Anesthesia Postprocedure Evaluation (Signed)
Anesthesia Post Note  Patient: Shelley Figueroa  Procedure(s) Performed: DILATATION AND CURETTAGE /HYSTEROSCOPY/MYOSURE POLYP RESECTION (Uterus) INTRAUTERINE DEVICE (IUD) REMOVAL (Uterus) INTRAUTERINE DEVICE (IUD) INSERTION (Uterus)     Patient location during evaluation: PACU Anesthesia Type: General Level of consciousness: awake and alert Pain management: pain level controlled Vital Signs Assessment: post-procedure vital signs reviewed and stable Respiratory status: spontaneous breathing, nonlabored ventilation, respiratory function stable and patient connected to nasal cannula oxygen Cardiovascular status: blood pressure returned to baseline and stable Postop Assessment: no apparent nausea or vomiting Anesthetic complications: no   No notable events documented.  Last Vitals:  Vitals:   04/27/22 0900 04/27/22 0925  BP: (!) 125/93 (!) 140/89  Pulse: 70 76  Resp: 14 16  Temp:  36.8 C  SpO2: 95% 100%    Last Pain:  Vitals:   04/28/22 1003  TempSrc:   PainSc: 0-No pain                 Earl Lites P Ivey Nembhard

## 2022-05-03 LAB — SURGICAL PATHOLOGY

## 2022-06-01 ENCOUNTER — Ambulatory Visit (INDEPENDENT_AMBULATORY_CARE_PROVIDER_SITE_OTHER): Payer: 59 | Admitting: Obstetrics & Gynecology

## 2022-06-01 ENCOUNTER — Encounter (HOSPITAL_BASED_OUTPATIENT_CLINIC_OR_DEPARTMENT_OTHER): Payer: Self-pay | Admitting: Obstetrics & Gynecology

## 2022-06-01 VITALS — BP 136/86 | HR 86 | Ht 66.0 in | Wt 150.6 lb

## 2022-06-01 DIAGNOSIS — N840 Polyp of corpus uteri: Secondary | ICD-10-CM

## 2022-06-01 DIAGNOSIS — N764 Abscess of vulva: Secondary | ICD-10-CM

## 2022-06-01 DIAGNOSIS — L723 Sebaceous cyst: Secondary | ICD-10-CM | POA: Diagnosis not present

## 2022-06-01 MED ORDER — FLUCONAZOLE 150 MG PO TABS: 150.0000 mg | ORAL_TABLET | Freq: Once | ORAL | 0 refills | Status: AC

## 2022-06-01 MED ORDER — SULFAMETHOXAZOLE-TRIMETHOPRIM 800-160 MG PO TABS: 1.0000 | ORAL_TABLET | Freq: Two times a day (BID) | ORAL | 0 refills | Status: DC

## 2022-06-01 NOTE — Progress Notes (Signed)
GYNECOLOGY  VISIT  CC:   post op recheck  HPI: 47 y.o. [id number] Married White or Caucasian female here for recheck after undergoing hysteroscopy with polyp removal, IUD removal and replacement on 04/27/2022.  She reports she had bleeding for about five days and she did pass some clots as well.  This has all resolved. She's had no bleeding or spotting since.  Unrelated, she's had a tender area on the right labia that has been present a few days.  It is better now but she would like me to look at this.  Also, she has an area that is perianal and non tender that she would like she to assess today.  Has not restarted vaginal estrogen cream and asks when she can.  Advised now is fine.  MEDS:   Current Outpatient Medications on File Prior to Visit  Medication Sig Dispense Refill   albuterol (PROVENTIL HFA;VENTOLIN HFA) 108 (90 Base) MCG/ACT inhaler Inhale 2 puffs into the lungs every 6 (six) hours as needed.     ALPRAZolam (XANAX) 0.25 MG tablet Take 0.5-1 tablets (0.125-0.25 mg total) by mouth 2 (two) times daily as needed for anxiety. 180 tablet 1   azelastine (ASTELIN) 0.1 % nasal spray Place into both nostrils 2 (two) times daily as needed. Use in each nostril as directed     buPROPion (WELLBUTRIN XL) 300 MG 24 hr tablet Take 1 tablet (300 mg total) by mouth daily. 90 tablet 1   cetirizine (ZYRTEC) 10 MG tablet Take 10 mg by mouth at bedtime.     clindamycin-benzoyl peroxide (BENZACLIN) gel      EPINEPHrine 0.3 mg/0.3 mL IJ SOAJ injection      estradiol (ESTRACE) 0.1 MG/GM vaginal cream 1 gram vaginally twice weekly 42.5 g 6   Eszopiclone 3 MG TABS Take 1 tablet (3 mg total) by mouth at bedtime as needed. Take immediately before bedtime 90 tablet 1   FLOVENT HFA 44 MCG/ACT inhaler Inhale 2 puffs into the lungs 2 (two) times daily.     fluticasone (FLONASE SENSIMIST) 27.5 MCG/SPRAY nasal spray      L-Lysine HCl 1000 MG TABS Take by mouth as needed.     Spacer/Aero-Holding Chambers ([place]) MISC See admin instructions.     valACYclovir (VALTREX) 500 MG tablet Take 1,000 mg by mouth 3 (three) times daily as needed.      valsartan-hydrochlorothiazide (DIOVAN HCT) 160-25 MG tablet Take 1 tablet by mouth daily. 90 tablet 3   No current facility-administered medications on file prior to visit.    SH:  Smoking No    PHYSICAL EXAMINATION:    BP 136/86 (BP Location: Right Arm, Patient Position: Sitting, Cuff Size: Large)   Pulse 86   Ht 5\' 6"  (1.676 m) Comment: Reported  Wt 150 lb 9.6 oz (68.3 kg)   BMI 24.31 kg/m     General appearance: alert, cooperative and appears stated age  Pelvic: External genitalia:  small erythematous, tender lesion on right labia majora with small amt of purulent drainage present              Urethra:  normal appearing urethra with no masses, tenderness or lesions   Vagina:  no lesions, no discharge   Cervix: 2cm IUD string noted, no lesion   Uterus:  non tender              Anus:  sebaceous cyst noted at 11 o'clock, non tender   Assessment/Plan: 1. Labial abscess - will treat with  antibiotics and antifungal given in case has yeast vaginitis (pt is prone to these) - sulfamethoxazole-trimethoprim (BACTRIM DS) 800-160 MG tablet; Take 1 tablet by mouth 2 (two) times daily.  Dispense: 10 tablet; Refill: 0 - fluconazole (DIFLUCAN) 150 MG tablet; Take 1 tablet (150 mg total) by mouth once for 1 dose. Repeat in 72 hours if symptoms are not completely resolved.  Dispense: 2 tablet; Refill: 0  2. Sebaceous cyst - perianal lesion is a sebaceous cyst.  Advised to leave alone unless having tenderness or other issues.  Could then referral for removal.

## 2022-06-14 ENCOUNTER — Encounter: Payer: Self-pay | Admitting: Pharmacist Clinician (PhC)/ Clinical Pharmacy Specialist

## 2022-06-14 ENCOUNTER — Ambulatory Visit: Payer: 59 | Attending: Cardiology | Admitting: Pharmacist Clinician (PhC)/ Clinical Pharmacy Specialist

## 2022-06-14 VITALS — BP 122/78 | HR 78 | Ht 66.0 in | Wt 146.0 lb

## 2022-06-14 DIAGNOSIS — I10 Essential (primary) hypertension: Secondary | ICD-10-CM

## 2022-06-14 NOTE — Patient Instructions (Signed)
Follow up appointment: with Dr. Oval Linsey in March  Take your BP meds as follows: continue with your current medications  Check your blood pressure at home daily (if able) and keep record of the readings.  Hypertension "High blood pressure"  Hypertension is often called "The Silent Killer." It rarely causes symptoms until it is extremely  high or has done damage to other organs in the body. For this reason, you should have your  blood pressure checked regularly by your physician. We will check your blood pressure  every time you see a provider at one of our offices.   Your blood pressure reading consists of two numbers. Ideally, blood pressure should be  below 120/80. The first ("top") number is called the systolic pressure. It measures the  pressure in your arteries as your heart beats. The second ("bottom") number is called the diastolic pressure. It measures the pressure in your arteries as the heart relaxes between beats.  The benefits of getting your blood pressure under control are enormous. A 10-point  reduction in systolic blood pressure can reduce your risk of stroke by 27% and heart failure by 28%  Your blood pressure goal is 130/80  To check your pressure at home you will need to:  1. Sit up in a chair, with feet flat on the floor and back supported. Do not cross your ankles or legs. 2. Rest your left arm so that the cuff is about heart level. If the cuff goes on your upper arm,  then just relax the arm on the table, arm of the chair or your lap. If you have a wrist cuff, we  suggest relaxing your wrist against your chest (think of it as Pledging the Flag with the  wrong arm).  3. Place the cuff snugly around your arm, about 1 inch above the crook of your elbow. The  cords should be inside the groove of your elbow.  4. Sit quietly, with the cuff in place, for about 5 minutes. After that 5 minutes press the power  button to start a reading. 5. Do not talk or move while  the reading is taking place.  6. Record your readings on a sheet of paper. Although most cuffs have a memory, it is often  easier to see a pattern developing when the numbers are all in front of you.  7. You can repeat the reading after 1-3 minutes if it is recommended  Make sure your bladder is empty and you have not had caffeine or tobacco within the last 30 min  Always bring your blood pressure log with you to your appointments. If you have not brought your monitor in to be double checked for accuracy, please bring it to your next appointment.  You can find a list of quality blood pressure cuffs at validatebp.org

## 2022-06-14 NOTE — Assessment & Plan Note (Signed)
Assessment: BP is controlled in office BP 122/78 mmHg;  above the goal (<130/80). Tolerates valsartan/hctz 160/25 well without any side effects Denies SOB, palpitation, chest pain, headaches,or swelling Reiterated the importance of regular exercise and low salt diet   Plan:  Continue taking all current medications Patient to keep record of BP readings with heart rate and report to [place] at the next visit Patient to follow up with Dr. Oval Linsey in 3 months for follow up  Labs ordered today:  none

## 2022-06-14 NOTE — Progress Notes (Signed)
06/30/2022 [name] 07/06/1975 [id number]   HPI:  Shelley Figueroa is a 47 y.o. female patient of Dr Oval Linsey, who presents today for hypertension clinic follow up.  In addition to hypertension, her cardiac history is significant for hyperlipidemia.  She was originally seen by Dr. Oval Linsey back in 2020, virtually because of [place].  At that time she mainly had high diastolic readings ([id number] on home cuff that day).  While she does have a family history of premature CAD, her coronary calcium score was 0 and echo was normal.   Over the next year her pressure became well controlled, but she returned earlier this year (after 2 year gap), noting that home readings were again increasing.  Her triamterene/hctz was switched to valsartan/hctz 160/25.  A full panel of labs drawn after that appointment showed her to have normal A1c, elevated cholesterol and low potassium.  She was given KCl 20 mEq x 3 days and asked to repeat BMET.   At her last visit home BP readings were doing better, as was her in office reading.  She was asked to return in 3 months for follow up.   Today she returns for follow up.  Notes that she is doing well with current medication.  She no longer feels any headaches or pounding sensations after salty meals.  Did have some dietary discretion over the holidays, but feels she is back on track.  Has not heard from the PREP exercise group at this time.      Headaches and pounding gone; even with salty meals; previously would notice pounding with increase salt intatke  Blood Pressure Goal:  130/80  Current Medications:   valsartan / hctz 160/25 qd  Family Hx:   father with MI and multiple stents, in his early 24's, later CABG x 4, now 10, CKD, COPD; mother with htn, hld for years, now 32; sister with htn,   Social Hx:  no tobacco, occasional alcohol, cup of tea in the mornings (irish breakfast tea), previous Coke addict, down to about 3-4 per week, drinks mostly water,   occasional espresso   Diet: gluten free about 7 years, now starting to avoid some dairy, using oat milk; eats out "a lot" with teenagers but no fast foods - chooses salads, non-fried foods   Exercise: was told she would be contacted in January for PREP program.  Currently using treadmill at home,  Home BP readings:    app on her phone won't open this morning - states from what she can recall, mostly [id number] systolic and < 80 diastolic  Intolerances:  lisinopril - cough  Labs: 9/23 Na 139, K 3.3, Glu 92, BUN 13, SCr 0.96, GFR 73   Wt Readings from Last 3 Encounters:  06/14/22 146 lb (66.2 kg)  06/01/22 150 lb 9.6 oz (68.3 kg)  04/27/22 148 lb 1.6 oz (67.2 kg)   BP Readings from Last 3 Encounters:  06/14/22 122/78  06/01/22 136/86  04/27/22 (!) 140/89   Pulse Readings from Last 3 Encounters:  06/14/22 78  06/01/22 86  04/27/22 76    Current Outpatient Medications  Medication Sig Dispense Refill   albuterol (PROVENTIL HFA;VENTOLIN HFA) 108 (90 Base) MCG/ACT inhaler Inhale 2 puffs into the lungs every 6 (six) hours as needed.     ALPRAZolam (XANAX) 0.25 MG tablet Take 0.5-1 tablets (0.125-0.25 mg total) by mouth 2 (two) times daily as needed for anxiety. 180 tablet 1   azelastine (ASTELIN) 0.1 % nasal  spray Place into both nostrils 2 (two) times daily as needed. Use in each nostril as directed     buPROPion (WELLBUTRIN XL) 300 MG 24 hr tablet Take 1 tablet (300 mg total) by mouth daily. 90 tablet 1   cetirizine (ZYRTEC) 10 MG tablet Take 10 mg by mouth at bedtime.     clindamycin-benzoyl peroxide (BENZACLIN) gel      EPINEPHrine 0.3 mg/0.3 mL IJ SOAJ injection      estradiol (ESTRACE) 0.1 MG/GM vaginal cream 1 gram vaginally twice weekly 42.5 g 6   Eszopiclone 3 MG TABS Take 1 tablet (3 mg total) by mouth at bedtime as needed. Take immediately before bedtime 90 tablet 1   FLOVENT HFA 44 MCG/ACT inhaler Inhale 2 puffs into the lungs 2 (two) times daily.     fluticasone (FLONASE  SENSIMIST) 27.5 MCG/SPRAY nasal spray      L-Lysine HCl 1000 MG TABS Take by mouth as needed.     Spacer/Aero-Holding Chambers ([place]) MISC See admin instructions.     valACYclovir (VALTREX) 500 MG tablet Take 1,000 mg by mouth 3 (three) times daily as needed.      valsartan-hydrochlorothiazide (DIOVAN HCT) 160-25 MG tablet Take 1 tablet by mouth daily. 90 tablet 3   No current facility-administered medications for this visit.    Allergies  Allergen Reactions   Penicillins Hives and Shortness Of Breath   Clindamycin Nausea And Vomiting   Codeine Itching   Contrast Media [Iodinated Contrast Media] Rash    Rash after cardiac CT   Lisinopril Other (See Comments)    cough   Doxycycline Nausea And Vomiting    Not a true allergy   Erythromycin Nausea And Vomiting   Iohexol Rash   Neomycin Nausea And Vomiting    Past Medical History:  Diagnosis Date   Allergic rhinitis due to pollen    Anxiety    Chronic cystitis    Depression    Dyspareunia in female    Dyspnea on exertion    per pt hard to recover after exercise (cardiologist has echo and cardiac CT ordered when schedule is opened up)   Essential hypertension    cardioloigst-  dr t. Oval Linsey-- FH premature CAD--- 08-18-2014 normal stress echo   Foot pain 02/15/2022   History of chronic bronchitis    History of HPV infection    remote   History of kidney stones    History of recurrent UTIs    Pure hypercholesterolemia 09/20/2018   Renal calculus, left    per pt non-obstructive    Blood pressure 122/78, pulse 78, height '5\' 6"'$  (1.676 m), weight 146 lb (66.2 kg).   Essential hypertension Assessment: BP is controlled in office BP 122/78 mmHg;  above the goal (<130/80). Tolerates valsartan/hctz 160/25 well without any side effects Denies SOB, palpitation, chest pain, headaches,or swelling Reiterated the importance of regular exercise and low salt diet   Plan:  Continue taking all current  medications Patient to keep record of BP readings with heart rate and report to [place] at the next visit Patient to follow up with Dr. Oval Linsey in 3 months for follow up  Labs ordered today:  none   Tommy Medal PharmD CPP [facility] [address] [place], [place] [address] [phone]

## 2022-06-30 ENCOUNTER — Encounter: Payer: Self-pay | Admitting: Pharmacist Clinician (PhC)/ Clinical Pharmacy Specialist

## 2022-07-25 ENCOUNTER — Ambulatory Visit: Payer: 59 | Admitting: Physician Assistant

## 2022-08-01 ENCOUNTER — Other Ambulatory Visit: Payer: Self-pay | Admitting: Obstetrics & Gynecology

## 2022-08-01 ENCOUNTER — Encounter (HOSPITAL_BASED_OUTPATIENT_CLINIC_OR_DEPARTMENT_OTHER): Payer: Self-pay | Admitting: Cardiovascular Disease

## 2022-08-01 DIAGNOSIS — E785 Hyperlipidemia, unspecified: Secondary | ICD-10-CM

## 2022-08-01 DIAGNOSIS — Z1231 Encounter for screening mammogram for malignant neoplasm of breast: Secondary | ICD-10-CM

## 2022-08-05 ENCOUNTER — Ambulatory Visit
Admission: RE | Admit: 2022-08-05 | Discharge: 2022-08-05 | Disposition: A | Discharge status: Home / Self Care | Payer: 59 | Source: Ambulatory Visit | Attending: Obstetrics & Gynecology | Admitting: Obstetrics & Gynecology

## 2022-08-05 DIAGNOSIS — Z1231 Encounter for screening mammogram for malignant neoplasm of breast: Secondary | ICD-10-CM

## 2022-08-07 ENCOUNTER — Other Ambulatory Visit: Payer: Self-pay | Admitting: Physician Assistant

## 2022-08-10 ENCOUNTER — Other Ambulatory Visit: Payer: Self-pay

## 2022-08-10 ENCOUNTER — Telehealth: Payer: Self-pay | Admitting: Physician Assistant

## 2022-08-10 MED ORDER — ESZOPICLONE 3 MG PO TABS: 3.0000 mg | ORAL_TABLET | Freq: Every evening | ORAL | 1 refills | Status: DC | PRN

## 2022-08-10 NOTE — Telephone Encounter (Signed)
Next appt is 08/29/22. Requesting refill on Lunesta 3 mg called to:  [id number] [place] [address]  Phone: [phone] Fax: [phone]

## 2022-08-10 NOTE — Telephone Encounter (Signed)
Pended.

## 2022-08-11 LAB — LIPID PANEL
Chol/HDL Ratio: 4.3 ratio (ref 0.0–4.4)
Cholesterol, Total: 252 mg/dL — ABNORMAL HIGH (ref 100–199)
HDL: 59 mg/dL (ref >39)
LDL Chol Calc (NIH): 159 mg/dL — ABNORMAL HIGH (ref 0–99)
Triglycerides: 185 mg/dL — ABNORMAL HIGH (ref 0–149)
VLDL Cholesterol Cal: 34 mg/dL (ref 5–40)

## 2022-08-11 LAB — HEPATIC FUNCTION PANEL
ALT: 74 IU/L — ABNORMAL HIGH (ref 0–32)
AST: 44 IU/L — ABNORMAL HIGH (ref 0–40)
Albumin: 4.3 g/dL (ref 3.9–4.9)
Alkaline Phosphatase: 94 IU/L (ref 44–121)
Bilirubin Total: 0.3 mg/dL (ref 0.0–1.2)
Bilirubin, Direct: <0.1 mg/dL (ref 0.00–0.40)
Total Protein: 6.3 g/dL (ref 6.0–8.5)

## 2022-08-16 ENCOUNTER — Ambulatory Visit (INDEPENDENT_AMBULATORY_CARE_PROVIDER_SITE_OTHER): Payer: 59 | Admitting: Cardiovascular Disease

## 2022-08-16 ENCOUNTER — Encounter (HOSPITAL_BASED_OUTPATIENT_CLINIC_OR_DEPARTMENT_OTHER): Payer: Self-pay | Admitting: Cardiovascular Disease

## 2022-08-16 VITALS — BP 116/74 | HR 78 | Ht 66.0 in | Wt 146.3 lb

## 2022-08-16 DIAGNOSIS — R7989 Other specified abnormal findings of blood chemistry: Secondary | ICD-10-CM | POA: Diagnosis not present

## 2022-08-16 DIAGNOSIS — I10 Essential (primary) hypertension: Secondary | ICD-10-CM | POA: Diagnosis not present

## 2022-08-16 DIAGNOSIS — E785 Hyperlipidemia, unspecified: Secondary | ICD-10-CM | POA: Diagnosis not present

## 2022-08-16 HISTORY — DX: Other specified abnormal findings of blood chemistry: R79.89

## 2022-08-16 NOTE — Patient Instructions (Signed)
Medication Instructions:  Your physician recommends that you continue on your current medications as directed. Please refer to the Current Medication list given to you today.  *If you need a refill on your cardiac medications before your next appointment, please call your pharmacy*   Lab Work: NONE  Testing/Procedures: CALCIUM SCORE IN 1 YEAR   Follow-Up: At [facility], you and your health needs are our priority.  As part of our continuing mission to provide you with exceptional heart care, we have created designated Provider Care Teams.  These Care Teams include your primary Cardiologist (physician) and Advanced Practice Providers (APPs -  Physician Assistants and Nurse Practitioners) who all work together to provide you with the care you need, when you need it.  We recommend signing up for the patient portal called "MyChart".  Sign up information is provided on this After Visit Summary.  MyChart is used to connect with patients for Virtual Visits (Telemedicine).  Patients are able to view lab/test results, encounter notes, upcoming appointments, etc.  Non-urgent messages can be sent to your provider as well.   To learn more about what you can do with MyChart, go to [email or web address].    Your next appointment:   AFTER CALCIUM SCORE 12 month(s)  Provider:   Skeet Latch, MD    You have been referred to GASTROENTEROLOGY

## 2022-08-16 NOTE — Progress Notes (Signed)
Cardiology Office Note    Evaluation Performed:  Follow-up visit  Date:  08/16/2022   ID:  Shelley Figueroa, DOB 01-29-1976, MRN [id number]  PCP:  Pleas Koch, NP  Cardiologist:  Skeet Latch, MD  Electrophysiologist:  None   Chief Complaint: hypertension  History of Present Illness:    Shelley Figueroa is a 47 y.o. female with hypertension and chronic bronchitis here for follow-up on hypertension and family history of heart disease.   Shelley Figueroa was initially diagnosed with hypertension around 2016. She initially tried lisinopril but developed a cough.  It was well-controlled on hydrochlorothiazide.  However her blood pressure then started to get higher.  She does have a stressful job but this had not changed. Shelley Figueroa is also concerned about a family history of heart disease.  Both her parents smoked heavily.  Her father had a heart attack in his early 46s and multiple stents placed.  He later went on to have quadruple bypass surgery.  Shelley Figueroa had a stress echo in 2015 that was negative for ischemia.  Her symptoms were thought to be musculoskeletal.   Shelley Figueroa followed up with Jory Sims and reported exertional dyspnea.  She had an echo 12/2018 that revealed normal systolic function and diastolic function.  It was otherwise unremarkable.  She also had a coronary CTA at that time that revealed no coronary artery disease and normal anatomy.  She did have a contrast reaction that required steroids.    At her visit 01/2022 she was doing well with her blood pressures averaging in the 130s over 80s.  She was under a lot of stress from work and from her children in college and senior year of high school.  She had gained weight and felt that this was why her blood pressures were running higher.  She was switched from [facility] to valsartan/HCTZ.  She followed up with our pharmacist and was feeling better.  Blood pressures were much better controlled.  Today,  she reports that she is doing well. 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 However her symptoms have improved. She has not had any headaches like she was having before. Her one concern is that her blood work is showing increased liver enzymes, but her diet and lifestyle are not risk factors for this increase. It has been elevated now for about 2 years. She denies any palpitations, chest pain, shortness of breath, or peripheral edema. No lightheadedness, headaches, syncope, orthopnea, or PND.    Past Medical History:  Diagnosis Date   Allergic rhinitis due to pollen    Anxiety    Chronic cystitis    Depression    Dyspareunia in female    Dyspnea on exertion    per pt hard to recover after exercise (cardiologist has echo and cardiac CT ordered when schedule is opened up)   Elevated LFTs 08/16/2022   Essential hypertension    cardioloigst-  dr t. Oval Linsey-- FH premature CAD--- 08-18-2014 normal stress echo   Foot pain 02/15/2022   History of chronic bronchitis    History of HPV infection    remote   History of kidney stones    History of recurrent UTIs    Pure hypercholesterolemia 09/20/2018   Renal calculus, left    per pt non-obstructive   Past Surgical History:  Procedure Laterality Date   CESAREAN SECTION  2004, 2005   CYSTOSCOPY N/A 11/06/2018   Procedure: CYSTOSCOPY;  Surgeon: Megan Salon, MD;  Location: [facility];  Service: Gynecology;  Laterality: N/A;   HYSTEROSCOPY WITH D & C N/A 04/27/2022   Procedure: DILATATION AND CURETTAGE /HYSTEROSCOPY/MYOSURE POLYP RESECTION;  Surgeon: Megan Salon, MD;  Location: [facility];  Service: Gynecology;  Laterality: N/A;   INTRAUTERINE DEVICE (IUD) INSERTION N/A 11/06/2018   Procedure: INTRAUTERINE DEVICE (IUD) INSERTION WITH ULTRASOUND GUIDANCE;  Surgeon: Megan Salon, MD;  Location: [place];  Service: Gynecology;  Laterality:  N/A;   INTRAUTERINE DEVICE (IUD) INSERTION N/A 04/27/2022   Procedure: INTRAUTERINE DEVICE (IUD) INSERTION;  Surgeon: Megan Salon, MD;  Location: [place];  Service: Gynecology;  Laterality: N/A;   IUD REMOVAL N/A 11/06/2018   Procedure: INTRAUTERINE DEVICE (IUD) REMOVAL;  Surgeon: Megan Salon, MD;  Location[facility];  Service: Gynecology;  Laterality: N/A;  Mirena IUD removal and reinsertion with ultrasound guidance.   IUD REMOVAL N/A 04/27/2022   Procedure: INTRAUTERINE DEVICE (IUD) REMOVAL;  Surgeon: Megan Salon, MD;  Location: [facility];  Service: Gynecology;  Laterality: N/A;   KNEE ARTHROSCOPY Right 1995   with left knee realignment   KNEE SURGERY Left 1991   NASAL SINUS SURGERY  2015   wtih septoplasty   SEPTOPLASTY     TONSILLECTOMY  1987     Current Meds  Medication Sig   albuterol (PROVENTIL HFA;VENTOLIN HFA) 108 (90 Base) MCG/ACT inhaler Inhale 2 puffs into the lungs every 6 (six) hours as needed.   ALPRAZolam (XANAX) 0.25 MG tablet Take 0.5-1 tablets (0.125-0.25 mg total) by mouth 2 (two) times daily as needed for anxiety.   azelastine (ASTELIN) 0.1 % nasal spray Place into both nostrils 2 (two) times daily as needed. Use in each nostril as directed   buPROPion (WELLBUTRIN XL) 300 MG 24 hr tablet TAKE 1 TABLET BY MOUTH EVERY DAY   cetirizine (ZYRTEC) 10 MG tablet Take 10 mg by mouth at bedtime.   clindamycin-benzoyl peroxide (BENZACLIN) gel    EPINEPHrine 0.3 mg/0.3 mL IJ SOAJ injection    estradiol (ESTRACE) 0.1 MG/GM vaginal cream 1 gram vaginally twice weekly   Eszopiclone 3 MG TABS Take 1 tablet (3 mg total) by mouth at bedtime as needed. Take immediately before bedtime   FLOVENT HFA 44 MCG/ACT inhaler Inhale 2 puffs into the lungs 2 (two) times daily.   fluticasone (FLONASE SENSIMIST) 27.5 MCG/SPRAY nasal spray    L-Lysine HCl 1000 MG TABS Take by mouth as needed.   Spacer/Aero-Holding Chambers ([place]) MISC See admin instructions.   valACYclovir (VALTREX) 500 MG tablet Take 1,000 mg by mouth 3 (three) times daily as needed.    valsartan-hydrochlorothiazide (DIOVAN HCT) 160-25 MG tablet Take 1 tablet by mouth daily.     Allergies:   Penicillins, Clindamycin, Codeine, Contrast media [iodinated contrast media], Lisinopril, Doxycycline, Erythromycin, Iohexol, and Neomycin   Social History   Tobacco Use   Smoking status: Never   Smokeless tobacco: Never  Vaping Use   Vaping Use: Never used  Substance Use Topics   Alcohol use: Yes    Alcohol/week: 0.0 - 1.0 standard drinks of alcohol    Comment: very rare   Drug use: Never     Family Hx: The patient's family history includes Alcohol abuse in her sister; Alzheimer's disease in her paternal grandmother; Bipolar disorder in her sister; Colon cancer in her maternal grandfather; Coronary artery disease in her father; Depression in her sister;  Hyperlipidemia in her father and mother; Hypertension in her father and mother; Obesity in her sister; Parkinson's disease in her maternal grandmother; Rheum arthritis in her mother and sister; Sjogren's syndrome in her mother.  ROS:   Please see the history of present illness.    All other systems reviewed and are negative.   Prior CV studies:   The following studies were reviewed today:  Stress echo 08/18/14: Study Conclusions  - Stress ECG conclusions: There were no stress arrhythmias or   conduction abnormalities. The stress ECG was normal. - Staged echo: Left ventricular ejection fraction was normal at   rest and with stress. Normal echo stress.  LVEF 60%.  Echo 01/24/19: IMPRESSIONS:   1. The left ventricle has hyperdynamic systolic function, with an  ejection fraction of >65%. The cavity size was normal. Left ventricular  diastolic parameters were normal.   2. The average left ventricular global longitudinal strain is -23.9 %.   3. The right ventricle has normal systolic  function. The cavity was  normal. There is no increase in right ventricular wall thickness.   4. The aorta is normal unless otherwise noted.   FINDINGS   Left Ventricle: The left ventricle has hyperdynamic systolic function,  with an ejection fraction of >65%. The cavity size was normal. There is no  increase in left ventricular wall thickness. Left ventricular diastolic  parameters were normal. Normal left   ventricular filling pressures The average left ventricular global  longitudinal strain is -23.9 %.   Coronary CT-A 01/25/19:   IMPRESSION: 1. Coronary calcium score of 0. This was 0 percentile for age and sex matched control. 2. Normal coronary origin with right dominance. 3. No evidence of CAD.  Labs/Other Tests and Data Reviewed:    EKG: EKG is personally reviewed.  08/16/2022: EKG was not ordered.  02/15/2022: Sinus rhythm 91 bpm. Nonspecific S-T changes.   An ECG dated 07/20/14 was personally reviewed today and demonstrated:  sinus rhythm.  Rate 87 bpm   02/14/2020: Sinus rhythm.  Rate 79 bpm.  Recent Labs: 04/27/2022: BUN 14; Creatinine, Ser 0.90; Hemoglobin 13.5; Platelets 269; Potassium 3.5; Sodium 140 08/10/2022: ALT 74   Recent Lipid Panel Lab Results  Component Value Date/Time   CHOL 252 (H) 08/10/2022 08:37 AM   TRIG 185 (H) 08/10/2022 08:37 AM   HDL 59 08/10/2022 08:37 AM   CHOLHDL 4.3 08/10/2022 08:37 AM   CHOLHDL 4 03/30/2018 08:04 AM   LDLCALC 159 (H) 08/10/2022 08:37 AM    Wt Readings from Last 3 Encounters:  08/16/22 146 lb 4.8 oz (66.4 kg)  06/14/22 146 lb (66.2 kg)  06/01/22 150 lb 9.6 oz (68.3 kg)     Objective:    VS:  BP 116/74 (BP Location: Left Arm, Patient Position: Sitting, Cuff Size: Normal)   Pulse 78   Ht 5\' 6"  (1.676 m)   Wt 146 lb 4.8 oz (66.4 kg)   SpO2 97%   BMI 23.61 kg/m  , BMI Body mass index is 23.61 kg/m. GENERAL:  Well appearing HEENT: Pupils equal round and reactive, fundi not visualized, oral mucosa  unremarkable NECK:  No jugular venous distention, waveform within normal limits, carotid upstroke brisk and symmetric, no bruits, no thyromegaly LUNGS:  Clear to auscultation bilaterally HEART:  RRR.  PMI not displaced or sustained,S1 and S2 within normal limits, no S3, no S4, no clicks, no rubs, no murmurs ABD:  Flat, positive bowel sounds normal in frequency in pitch, no bruits, no rebound, no  guarding, no midline pulsatile mass, no hepatomegaly, no splenomegaly EXT:  2 plus pulses throughout, no edema, no cyanosis no clubbing SKIN:  No rashes no nodules NEURO:  Cranial nerves II through XII grossly intact, motor grossly intact throughout PSYCH:  Cognitively intact, oriented to person place and time   ASSESSMENT & PLAN:    No problem-specific Assessment & Plan notes found for this encounter.  # Hypertension: The patient reports good adherence to valsartan and has been exercising regularly. Blood pressure readings have been within normal limits. The plan is to continue valsartan/HCTZ and maintain a regular exercise routine.  # Elevated Liver Enzymes: The patient has a history of elevated liver enzymes with no clear explanation. No EtOH or Tylenol.  The plan is to refer the patient to a gastroenterologist for further evaluation and management.  # Hyperlipidemia: The patient's cholesterol levels remain elevated. A coronary calcium score was performed in 2020 and she had no plaque. The plan is to repeat the coronary calcium score in 2025 to monitor for any changes. The patient is encouraged to continue with lifestyle modifications, including diet and exercise, to help lower cholesterol levels.   Disposition:  Follow up with Robie Mcniel C. Oval Linsey, MD, [facility] in 1 year after coronary calcium score  Medication Adjustments/Labs and Tests Ordered: Current medicines are reviewed at length with the patient today.  Concerns regarding medicines are outlined above.   Tests Ordered: Orders Placed This  Encounter  Procedures   Ambulatory referral to Gastroenterology    Medication Changes: No orders of the defined types were placed in this encounter.   I,Jessica Ford,acting as a Education administrator for [place], MD.,have documented all relevant documentation on the behalf of Skeet Latch, MD,as directed by  Skeet Latch, MD while in the presence of Skeet Latch, MD.   I, Beckett Oval Linsey, MD have reviewed all documentation for this visit.  The documentation of the exam, diagnosis, procedures, and orders on 08/16/2022 are all accurate and complete.   Signed, Skeet Latch, MD  08/16/2022 9:50 AM    [place]

## 2022-08-19 ENCOUNTER — Encounter: Payer: Self-pay | Admitting: Gastroenterology

## 2022-08-19 ENCOUNTER — Telehealth: Payer: Self-pay | Admitting: Gastroenterology

## 2022-08-19 NOTE — Telephone Encounter (Signed)
Ok to schedule her in APP clinic or mine whichever is sooner RG

## 2022-08-19 NOTE — Telephone Encounter (Signed)
Good morning Dr. Lyndel Safe  The following patient was referred to [place] for elevated LFT's. She had a colonoscopy done 2 years ago as a preventive screening with [facility] off a referral from [place]. She is requesting you as her provider. Her records are in Epic for review. Please review and advise of scheduling. Thank you.

## 2022-08-29 ENCOUNTER — Ambulatory Visit (INDEPENDENT_AMBULATORY_CARE_PROVIDER_SITE_OTHER): Payer: 59 | Admitting: Physician Assistant

## 2022-08-29 ENCOUNTER — Encounter: Payer: Self-pay | Admitting: Physician Assistant

## 2022-08-29 DIAGNOSIS — F411 Generalized anxiety disorder: Secondary | ICD-10-CM

## 2022-08-29 DIAGNOSIS — G4700 Insomnia, unspecified: Secondary | ICD-10-CM

## 2022-08-29 DIAGNOSIS — F3342 Major depressive disorder, recurrent, in full remission: Secondary | ICD-10-CM

## 2022-08-29 MED ORDER — ALPRAZOLAM 0.25 MG PO TABS: 0.1250 mg | ORAL_TABLET | Freq: Two times a day (BID) | ORAL | 1 refills | Status: DC | PRN

## 2022-08-29 NOTE — Progress Notes (Signed)
[facility] Med Check  Patient ID: Shelley Figueroa,  MRN: [id number]  PCP: Pleas Koch, NP  Date of Evaluation: 08/29/2022 Time spent:20 minutes  Chief Complaint:  Chief Complaint   Anxiety; Depression; Insomnia; Follow-up    HISTORY/CURRENT STATUS: HPI For routine med check.   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 Very busy and stressful, but in a good way. A lot going on, not sleeping well, Lunesta doesn't help like it used to. She was sick a few months ago, the pharmacist told her not to take Xanax with the antibiotic, she didn't for 5 days but was super anxious so went back on it. More trouble falling asleep than staying asleep.   Patient is able to enjoy things.  Energy and motivation are good.  Work is going well.   No extreme sadness, tearfulness, or feelings of hopelessness.  ADLs and personal hygiene are normal.   Denies any changes in concentration, making decisions, or remembering things.  Appetite has not changed.  Weight is stable.  Denies suicidal or homicidal thoughts.  Patient denies increased energy with decreased need for sleep, increased talkativeness, racing thoughts, impulsivity or risky behaviors, increased spending, increased libido, grandiosity, increased irritability or anger, paranoia, or hallucinations.  Denies dizziness, syncope, seizures, numbness, tingling, tremor, tics, unsteady gait, slurred speech, confusion. Denies muscle or joint pain, stiffness, or dystonia.  Individual Medical History/ Review of Systems: Changes? :No      Past medications for mental health diagnoses include: Ambien caused amnesia, Lunesta, Wellbutrin, Xanax, Melatonin causes h/a, Mirtazapine  Allergies: Penicillins, Clindamycin, Codeine, Contrast media [iodinated contrast media], Lisinopril, Doxycycline, Erythromycin, Iohexol, and Neomycin  Current Medications:  Current Outpatient  Medications:    albuterol (PROVENTIL HFA;VENTOLIN HFA) 108 (90 Base) MCG/ACT inhaler, Inhale 2 puffs into the lungs every 6 (six) hours as needed., Disp: , Rfl:    azelastine (ASTELIN) 0.1 % nasal spray, Place into both nostrils 2 (two) times daily as needed. Use in each nostril as directed, Disp: , Rfl:    buPROPion (WELLBUTRIN XL) 300 MG 24 hr tablet, TAKE 1 TABLET BY MOUTH EVERY DAY, Disp: 90 tablet, Rfl: 0   cetirizine (ZYRTEC) 10 MG tablet, Take 10 mg by mouth at bedtime., Disp: , Rfl:    clindamycin-benzoyl peroxide (BENZACLIN) gel, , Disp: , Rfl:    estradiol (ESTRACE) 0.1 MG/GM vaginal cream, 1 gram vaginally twice weekly, Disp: 42.5 g, Rfl: 6   Eszopiclone 3 MG TABS, Take 1 tablet (3 mg total) by mouth at bedtime as needed. Take immediately before bedtime, Disp: 90 tablet, Rfl: 1   FLOVENT HFA 44 MCG/ACT inhaler, Inhale 2 puffs into the lungs 2 (two) times daily., Disp: , Rfl:    fluticasone (FLONASE SENSIMIST) 27.5 MCG/SPRAY nasal spray, , Disp: , Rfl:    L-Lysine HCl 1000 MG TABS, Take by mouth as needed., Disp: , Rfl:    Spacer/Aero-Holding Chambers ([place]) MISC, See admin instructions., Disp: , Rfl:    valACYclovir (VALTREX) 500 MG tablet, Take 1,000 mg by mouth 3 (three) times daily as needed. , Disp: , Rfl:    valsartan-hydrochlorothiazide (DIOVAN HCT) 160-25 MG tablet, Take 1 tablet by mouth daily., Disp: 90 tablet, Rfl: 3   ALPRAZolam (XANAX) 0.25 MG tablet, Take 0.5-2 tablets (0.125-0.5 mg total) by mouth 2 (two) times daily as needed for anxiety., Disp: 180 tablet, Rfl: 1   EPINEPHrine 0.3 mg/0.3 mL IJ SOAJ  injection, , Disp: , Rfl:  Medication Side Effects: none  Family Medical/ Social History: Changes?  No  MENTAL HEALTH EXAM:  There were no vitals taken for this visit.There is no height or weight on file to calculate BMI.  General Appearance: Casual, Neat and Well Groomed  Eye Contact:  Good  Speech:  Clear and Coherent  Volume:  Normal  Mood:  Euthymic   Affect:  Appropriate  Thought Process:  Goal Directed and Descriptions of Associations: Circumstantial  Orientation:  Full (Time, Place, and Person)  Thought Content: Logical   Suicidal Thoughts:  No  Homicidal Thoughts:  No  Memory:  WNL  Judgement:  Good  Insight:  Good  Psychomotor Activity:  Normal  Concentration:  Concentration: Good and Attention Span: Good  Recall:  Good  Fund of Knowledge: Good  Language: Good  Assets:  Communication Skills Desire for Improvement Financial Resources/Insurance Housing Transportation Vocational/Educational  ADL's:  Intact  Cognition: WNL  Prognosis:  Good   DIAGNOSES:    ICD-10-CM   1. Recurrent major depressive disorder, in full remission  F33.42     2. Generalized anxiety disorder  F41.1     3. Insomnia, unspecified type  G47.00       Receiving Psychotherapy: No      RECOMMENDATIONS:  PDMP was reviewed.  Last Xanax was filled 05/13/2022.  Lunesta filled 08/10/2022.   I provided 20 minutes of face to face time during this encounter, including time spent before and after the visit in records review, medical decision making, counseling pertinent to today's visit, and charting.   We discussed the problems sleeping.  She has had a lot going on in the past 3 months, traveling often and in different time zones.  Under a lot of stress even though it is good, trying to figure out where her son will end up going to college.  So there are probably multiple factors causing the sleep difficulties.  We agree for her to increase the Xanax at night for now.  If that is not effective she will call in 1 week and I will change Lunesta to [place] [facility] [place].  If that does not work then will order a sleep study.  She will try to be better about practicing good sleep hygiene.  Continue Xanax 0.25 mg, 1/2-1 twice daily.  (Take 1-2 p.o. nightly as needed. Continue Wellbutrin XL 300 mg 1 p.o. daily. Continue Lunesta 3 mg nightly as needed. Return in 2-3  months.  Donnal Moat, PA-C

## 2022-08-31 ENCOUNTER — Encounter: Payer: Self-pay | Admitting: Gastroenterology

## 2022-08-31 ENCOUNTER — Ambulatory Visit (INDEPENDENT_AMBULATORY_CARE_PROVIDER_SITE_OTHER): Payer: 59 | Admitting: Gastroenterology

## 2022-08-31 ENCOUNTER — Other Ambulatory Visit (INDEPENDENT_AMBULATORY_CARE_PROVIDER_SITE_OTHER): Payer: 59

## 2022-08-31 VITALS — BP 120/88 | HR 89 | Ht 66.0 in | Wt 150.0 lb

## 2022-08-31 DIAGNOSIS — E785 Hyperlipidemia, unspecified: Secondary | ICD-10-CM | POA: Diagnosis not present

## 2022-08-31 DIAGNOSIS — I10 Essential (primary) hypertension: Secondary | ICD-10-CM | POA: Diagnosis not present

## 2022-08-31 DIAGNOSIS — R7989 Other specified abnormal findings of blood chemistry: Secondary | ICD-10-CM | POA: Diagnosis not present

## 2022-08-31 DIAGNOSIS — F419 Anxiety disorder, unspecified: Secondary | ICD-10-CM | POA: Diagnosis not present

## 2022-08-31 DIAGNOSIS — R945 Abnormal results of liver function studies: Secondary | ICD-10-CM

## 2022-08-31 LAB — CBC WITH DIFFERENTIAL/PLATELET
Basophils Absolute: 0.1 10*3/uL (ref 0.0–0.1)
Basophils Relative: 0.7 % (ref 0.0–3.0)
Eosinophils Absolute: 0.1 10*3/uL (ref 0.0–0.7)
Eosinophils Relative: 1 % (ref 0.0–5.0)
HCT: 39.5 % (ref 36.0–46.0)
Hemoglobin: 13.7 g/dL (ref 12.0–15.0)
Lymphocytes Relative: 21.5 % (ref 12.0–46.0)
Lymphs Abs: 1.9 10*3/uL (ref 0.7–4.0)
MCHC: 34.6 g/dL (ref 30.0–36.0)
MCV: 88.9 fl (ref 78.0–100.0)
Monocytes Absolute: 0.6 10*3/uL (ref 0.1–1.0)
Monocytes Relative: 7.3 % (ref 3.0–12.0)
Neutro Abs: 6 10*3/uL (ref 1.4–7.7)
Neutrophils Relative %: 69.5 % (ref 43.0–77.0)
Platelets: 299 10*3/uL (ref 150.0–400.0)
RBC: 4.44 Mil/uL (ref 3.87–5.11)
RDW: 13.6 % (ref 11.5–15.5)
WBC: 8.6 10*3/uL (ref 4.0–10.5)

## 2022-08-31 LAB — COMPREHENSIVE METABOLIC PANEL
ALT: 35 U/L (ref 0–35)
AST: 19 U/L (ref 0–37)
Albumin: 4.5 g/dL (ref 3.5–5.2)
Alkaline Phosphatase: 77 U/L (ref 39–117)
BUN: 15 mg/dL (ref 6–23)
CO2: 32 mEq/L (ref 19–32)
Calcium: 9.4 mg/dL (ref 8.4–10.5)
Chloride: 98 mEq/L (ref 96–112)
Creatinine, Ser: 0.88 mg/dL (ref 0.40–1.20)
GFR: 77.41 mL/min (ref >60.00)
Glucose, Bld: 98 mg/dL (ref 70–99)
Potassium: 3.6 mEq/L (ref 3.5–5.1)
Sodium: 135 mEq/L (ref 135–145)
Total Bilirubin: 0.6 mg/dL (ref 0.2–1.2)
Total Protein: 6.9 g/dL (ref 6.0–8.3)

## 2022-08-31 LAB — GAMMA GT: GGT: 57 U/L — ABNORMAL HIGH (ref 7–51)

## 2022-08-31 LAB — IBC + FERRITIN
Ferritin: 50.1 ng/mL (ref 10.0–291.0)
Iron: 121 ug/dL (ref 42–145)
Saturation Ratios: 31.1 % (ref 20.0–50.0)
TIBC: 389.2 ug/dL (ref 250.0–450.0)
Transferrin: 278 mg/dL (ref 212.0–360.0)

## 2022-08-31 NOTE — Patient Instructions (Addendum)
_______________________________________________________  If your blood pressure at your visit was 140/90 or greater, please contact your primary care physician to follow up on this.  _______________________________________________________  If you are age 49 or older, your body mass index should be between 23-30. Your Body mass index is 24.21 kg/m. If this is out of the aforementioned range listed, please consider follow up with your Primary Care Provider.  If you are age 100 or younger, your body mass index should be between 19-25. Your Body mass index is 24.21 kg/m. If this is out of the aformentioned range listed, please consider follow up with your Primary Care Provider.   ________________________________________________________  The [facility] providers would like to encourage you to use [facility] to communicate with providers for non-urgent requests or questions.  Due to long hold times on the telephone, sending your provider a message by [facility] may be a faster and more efficient way to get a response.  Please allow 48 business hours for a response.  Please remember that this is for non-urgent requests.  _______________________________________________________  Your provider has requested that you go to the basement level for lab work before leaving today. Press "B" on the elevator. The lab is located at the first door on the left as you exit the elevator.  Try to lose 6 pounds over the nest 3 months  You have been scheduled for an appointment with Dr. Lyndel Figueroa on 11-29-2022 at 340pm. Please arrive 10 minutes early for your appointment.   You have been scheduled for an abdominal ultrasound at [facility] (1st floor of hospital) on Friday April 12th at 830am. Please arrive 15-30 minutes prior to your appointment for registration. Make certain not to have anything to eat or drink midnight prior to your appointment. Should you need to reschedule your appointment, please contact radiology  at [phone].  Thank you,  Dr. Jackquline Denmark

## 2022-08-31 NOTE — Progress Notes (Signed)
Chief Complaint: Abn LFTs  Referring Provider:  Pleas Koch, NP      ASSESSMENT AND PLAN;   #1. Abn LFTs- likely d/t fatty liver. R/O other causes. Neg ANA  Plan: -USE -Check CBC, CMP, acute viral hepatitis, autoimmune hepatitis panel (AMA, ASMA), iron studies, S. ceruloplasmin, A1AT, celiac screen and GGT. -Check anti-HAV total Ab and HBsAb.  If negative, would recommend vaccination for hepatitis A and B. -Wt loss (6lb over 12 weeks)-start walking.  Avoid fatty foods and foods with high fructose corn syrup.  No sodas. -FU in 12 weeks   HPI:    Shelley Figueroa is a 47 y.o. female  Very pleasant With HTN, HLD, anxiety Here with abn LFTs as below Has gained weight gradually No alcohol.     Latest Ref Rng & Units 08/10/2022    8:37 AM 02/23/2022    9:56 AM 03/30/2018    8:04 AM  Hepatic Function  Total Protein 6.0 - 8.5 g/dL 6.3  6.1  6.6   Albumin 3.9 - 4.9 g/dL 4.3  4.2  4.3   AST 0 - 40 IU/L 44  29  16   ALT 0 - 32 IU/L 74  35  21   Alk Phosphatase 44 - 121 IU/L 94  71  50   Total Bilirubin 0.0 - 1.2 mg/dL 0.3  0.4  0.7   Bilirubin, Direct 0.00 - 0.40 mg/dL <0.10       Has been on gluten restricted diet.  It improved her joint pains and general wellbeing.  Improved headaches as well.  No nausea, vomiting, heartburn, regurgitation, odynophagia or dysphagia.  No significant diarrhea or constipation.  No melena or hematochezia. No unintentional weight loss. No abdominal pain.  No H/O itching, skin lesions, easy bruisability, intake of OTC meds including diet pills, herbal medications, anabolic steroids or Tylenol. There is no H/O blood transfusions, IVDA or FH of liver disease. No jaundice, dark urine or pale stools.  Past GI workup: Colon- neg 01/2020 (Dr Collene Mares)- rpt 10 yrs.   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 Oval Linsey-- FH premature CAD--- 08-18-2014 normal stress echo   Foot pain 02/15/2022   History of chronic bronchitis    History of HPV infection    remote   History of kidney stones    History of recurrent UTIs    Pure hypercholesterolemia 09/20/2018   Renal calculus, left    per pt non-obstructive    Past Surgical History:  Procedure Laterality Date   CESAREAN SECTION  2004, 2005   CYSTOSCOPY N/A 11/06/2018   Procedure: CYSTOSCOPY;  Surgeon: Megan Salon, MD;  Location: [facility];  Service: Gynecology;  Laterality: N/A;   HYSTEROSCOPY WITH D & C N/A 04/27/2022   Procedure: DILATATION AND CURETTAGE /HYSTEROSCOPY/MYOSURE POLYP RESECTION;  Surgeon: Megan Salon, MD;  Location: [facility];  Service: Gynecology;  Laterality: N/A;   INTRAUTERINE DEVICE (IUD) INSERTION N/A 11/06/2018   Procedure: INTRAUTERINE DEVICE (IUD) INSERTION WITH ULTRASOUND GUIDANCE;  Surgeon: Megan Salon, MD;  Location: [place];  Service: Gynecology;  Laterality: N/A;   INTRAUTERINE DEVICE (IUD) INSERTION N/A 04/27/2022   Procedure: INTRAUTERINE DEVICE (IUD) INSERTION;  Surgeon: Megan Salon, MD;  Location: [place];  Service: Gynecology;  Laterality: N/A;   IUD REMOVAL N/A 11/06/2018   Procedure: INTRAUTERINE DEVICE (IUD) REMOVAL;  Surgeon: Megan Salon, MD;  Location: [facility];  Service: Gynecology;  Laterality: N/A;  Mirena IUD removal and reinsertion with ultrasound guidance.   IUD REMOVAL N/A 04/27/2022   Procedure: INTRAUTERINE DEVICE (IUD) REMOVAL;  Surgeon: Megan Salon, MD;  Location: [facility];  Service: Gynecology;  Laterality: N/A;   KNEE ARTHROSCOPY Right 1995   with left knee realignment   KNEE SURGERY Left 1991   NASAL SINUS SURGERY  2015   wtih septoplasty   SEPTOPLASTY     TONSILLECTOMY  1987    Family History  Problem Relation Age of Onset   Hypertension Mother    Hyperlipidemia Mother    Rheum arthritis Mother    Sjogren's syndrome Mother    Hypertension Father    Hyperlipidemia Father    Coronary artery disease Father        MI 54s, smoked, CABG, MULTIPLE STENTS   Bipolar disorder Sister    Alcohol abuse Sister    Depression Sister    Rheum arthritis Sister    Obesity Sister    Parkinson's disease Maternal Grandmother    Colon cancer Maternal Grandfather    Alzheimer's disease Paternal Grandmother    Liver disease Neg Hx    Esophageal cancer Neg Hx     Social History   Tobacco Use   Smoking status: Never   Smokeless tobacco: Never  Vaping Use   Vaping Use: Never used  Substance Use Topics   Alcohol use: Yes    Alcohol/week: 0.0 - 1.0 standard drinks of alcohol   Drug use: Never    Current Outpatient Medications  Medication Sig Dispense Refill   albuterol (PROVENTIL HFA;VENTOLIN HFA) 108 (90 Base) MCG/ACT inhaler Inhale 2 puffs into the lungs every 6 (six) hours as needed.     ALPRAZolam (XANAX) 0.25 MG tablet Take 0.5-2 tablets (0.125-0.5 mg total) by mouth 2 (two) times daily as needed for anxiety. 180 tablet 1   azelastine (ASTELIN) 0.1 % nasal spray Place into both nostrils 2 (two) times daily as needed. Use in each nostril as directed     buPROPion (WELLBUTRIN XL) 300 MG 24 hr tablet TAKE 1 TABLET BY MOUTH EVERY DAY 90 tablet 0   cetirizine (ZYRTEC) 10 MG tablet Take 10 mg by mouth at bedtime.     clindamycin-benzoyl peroxide (BENZACLIN) gel      EPINEPHrine 0.3 mg/0.3 mL IJ SOAJ injection      estradiol (ESTRACE) 0.1 MG/GM vaginal cream 1 gram vaginally twice weekly 42.5 g 6    Eszopiclone 3 MG TABS Take 1 tablet (3 mg total) by mouth at bedtime as needed. Take immediately before bedtime 90 tablet 1   FLOVENT HFA 44 MCG/ACT inhaler Inhale 2 puffs into the lungs 2 (two) times daily.     fluticasone (FLONASE SENSIMIST) 27.5 MCG/SPRAY nasal spray      L-Lysine HCl 1000 MG TABS Take by mouth as needed.  Spacer/Aero-Holding Chambers ([place]) MISC See admin instructions.     valACYclovir (VALTREX) 500 MG tablet Take 1,000 mg by mouth 3 (three) times daily as needed.      valsartan-hydrochlorothiazide (DIOVAN HCT) 160-25 MG tablet Take 1 tablet by mouth daily. 90 tablet 3   No current facility-administered medications for this visit.    Allergies  Allergen Reactions   Penicillins Hives and Shortness Of Breath   Clindamycin Nausea And Vomiting   Codeine Itching   Contrast Media [Iodinated Contrast Media] Rash    Rash after cardiac CT   Lisinopril Other (See Comments)    cough   Doxycycline Nausea And Vomiting    Not a true allergy   Erythromycin Nausea And Vomiting   Iohexol Rash   Neomycin Nausea And Vomiting    Review of Systems:  Constitutional: Denies fever, chills, diaphoresis, appetite change and fatigue.  HEENT: Has allergies.  Does not use Tylenol or over-the-counter supplements except gluten digest at times Respiratory: Denies SOB, DOE, cough, chest tightness,  and wheezing.   Cardiovascular: Denies chest pain, palpitations and leg swelling.  Genitourinary: Denies dysuria, urgency, frequency, hematuria, flank pain and difficulty urinating.  Musculoskeletal: Denies myalgias, back pain, joint swelling, arthralgias and gait problem.  Skin: No rash.  Neurological: Denies dizziness, seizures, syncope, weakness, light-headedness, numbness and headaches.  Hematological: Denies adenopathy. Easy bruising, personal or family bleeding history  Psychiatric/Behavioral: Has anxiety, no depression     Physical Exam:    BP 120/88   Pulse 89    Ht 5\' 6"  (1.676 m)   Wt 150 lb (68 kg)   BMI 24.21 kg/m  Wt Readings from Last 3 Encounters:  08/31/22 150 lb (68 kg)  08/16/22 146 lb 4.8 oz (66.4 kg)  06/14/22 146 lb (66.2 kg)   Constitutional:  Well-developed, in no acute distress. Psychiatric: Normal mood and affect. Behavior is normal. HEENT: Conjunctivae are normal. No scleral icterus. Cardiovascular: Normal rate, regular rhythm. No edema Pulmonary/chest: Effort normal and breath sounds normal. No wheezing, rales or rhonchi. Abdominal: Soft, nondistended. Nontender. Bowel sounds active throughout. There are no masses palpable.  Liver is palpated 2 cm below the costal margin.  Nontender. Rectal: Deferred Neurological: Alert and oriented to person place and time. Skin: Skin is warm and dry. No rashes noted.  Data Reviewed: I have personally reviewed following labs and imaging studies  CBC:    Latest Ref Rng & Units 04/27/2022    6:30 AM 04/27/2022    6:25 AM 12/05/2019   11:35 AM  CBC  WBC 4.0 - 10.5 K/uL 6.2   6.2   Hemoglobin 12.0 - 15.0 g/dL 13.5  12.9  13.6   Hematocrit 36.0 - 46.0 % 38.2  38.0  39.2   Platelets 150 - 400 K/uL 269   265     CMP:    Latest Ref Rng & Units 08/10/2022    8:37 AM 04/27/2022    6:25 AM 03/01/2022   10:27 AM  CMP  Glucose 70 - 99 mg/dL  97  94   BUN 6 - 20 mg/dL  14  14   Creatinine 0.44 - 1.00 mg/dL  0.90  0.98   Sodium 135 - 145 mmol/L  140  139   Potassium 3.5 - 5.1 mmol/L  3.5  3.6   Chloride 98 - 111 mmol/L  101  99   CO2 20 - 29 mmol/L   25   Calcium 8.7 - 10.2 mg/dL   9.2  Total Protein 6.0 - 8.5 g/dL 6.3     Total Bilirubin 0.0 - 1.2 mg/dL 0.3     Alkaline Phos 44 - 121 IU/L 94     AST 0 - 40 IU/L 44     ALT 0 - 32 IU/L 74        Radiology Studies: MM 3D SCREEN BREAST BILATERAL  Result Date: 08/10/2022 CLINICAL DATA:  Screening. EXAM: DIGITAL SCREENING BILATERAL MAMMOGRAM WITH TOMOSYNTHESIS AND CAD TECHNIQUE: Bilateral screening digital craniocaudal and mediolateral  oblique mammograms were obtained. Bilateral screening digital breast tomosynthesis was performed. The images were evaluated with computer-aided detection. COMPARISON:  Previous exam(s). ACR Breast Density Category d: The breasts are extremely dense, which lowers the sensitivity of mammography. FINDINGS: There are no findings suspicious for malignancy. IMPRESSION: No mammographic evidence of malignancy. A result letter of this screening mammogram will be mailed directly to the patient. RECOMMENDATION: Screening mammogram in one year. (Code:SM-B-01Y) BI-RADS CATEGORY  1: Negative. Electronically Signed   By: Lajean Manes M.D.   On: 08/10/2022 07:45      Carmell Austria, MD 08/31/2022, 3:46 PM  Cc: Pleas Koch, NP

## 2022-09-04 LAB — ACUTE VIRAL HEPATITIS (HAV, HBV, HCV)
HCV Ab: NONREACTIVE
Hep A IgM: NEGATIVE
Hep B C IgM: NEGATIVE
Hepatitis B Surface Ag: NEGATIVE

## 2022-09-04 LAB — CELIAC PANEL 10
Antigliadin Abs, IgA: 7 units (ref 0–19)
Endomysial IgA: NEGATIVE
Gliadin IgG: 3 units (ref 0–19)
IgA/Immunoglobulin A, Serum: 109 mg/dL (ref 87–352)
Tissue Transglut Ab: <2 U/mL (ref 0–5)
Transglutaminase IgA: <2 U/mL (ref 0–3)

## 2022-09-04 LAB — HCV INTERPRETATION

## 2022-09-06 LAB — HEPATITIS B SURFACE ANTIBODY,QUALITATIVE: Hep B S Ab: REACTIVE — AB

## 2022-09-06 LAB — MITOCHONDRIAL ANTIBODIES: Mitochondrial M2 Ab, IgG: <=20 U (ref <=20.0)

## 2022-09-06 LAB — CERULOPLASMIN: Ceruloplasmin: 27 mg/dL (ref 18–53)

## 2022-09-06 LAB — ANTI-SMOOTH MUSCLE ANTIBODY, IGG: Actin (Smooth Muscle) Antibody (IGG): <20 U (ref <20)

## 2022-09-06 LAB — HEPATITIS A ANTIBODY, TOTAL: Hepatitis A AB,Total: REACTIVE — AB

## 2022-09-06 LAB — ALPHA-1-ANTITRYPSIN: A-1 Antitrypsin, Ser: 111 mg/dL (ref 83–199)

## 2022-09-09 ENCOUNTER — Ambulatory Visit (HOSPITAL_COMMUNITY)
Admission: RE | Admit: 2022-09-09 | Discharge: 2022-09-09 | Disposition: A | Discharge status: Home / Self Care | Payer: 59 | Source: Ambulatory Visit | Attending: Gastroenterology | Admitting: Gastroenterology

## 2022-09-09 DIAGNOSIS — R7989 Other specified abnormal findings of blood chemistry: Secondary | ICD-10-CM | POA: Insufficient documentation

## 2022-11-03 ENCOUNTER — Other Ambulatory Visit: Payer: Self-pay | Admitting: Physician Assistant

## 2022-11-15 ENCOUNTER — Encounter: Payer: Self-pay | Admitting: Physician Assistant

## 2022-11-15 ENCOUNTER — Ambulatory Visit (INDEPENDENT_AMBULATORY_CARE_PROVIDER_SITE_OTHER): Payer: 59 | Admitting: Physician Assistant

## 2022-11-15 DIAGNOSIS — F3342 Major depressive disorder, recurrent, in full remission: Secondary | ICD-10-CM | POA: Diagnosis not present

## 2022-11-15 DIAGNOSIS — G4700 Insomnia, unspecified: Secondary | ICD-10-CM | POA: Diagnosis not present

## 2022-11-15 DIAGNOSIS — F411 Generalized anxiety disorder: Secondary | ICD-10-CM

## 2022-11-15 MED ORDER — ALPRAZOLAM 0.25 MG PO TABS: 0.1250 mg | ORAL_TABLET | Freq: Two times a day (BID) | ORAL | 1 refills | Status: DC | PRN

## 2022-11-15 MED ORDER — BUPROPION HCL ER (XL) 300 MG PO TB24: 300.0000 mg | ORAL_TABLET | Freq: Every day | ORAL | 1 refills | Status: DC

## 2022-11-15 MED ORDER — ESZOPICLONE 3 MG PO TABS: 3.0000 mg | ORAL_TABLET | Freq: Every evening | ORAL | 1 refills | Status: DC | PRN

## 2022-11-15 NOTE — Progress Notes (Signed)
[facility] Check  Patient ID: Shelley Figueroa,  MRN: [id number]  PCP: Doreene Nest, NP  Date of Evaluation: 11/15/2022 Time spent:20 minutes  Chief Complaint:  Chief Complaint   Anxiety; Depression; Insomnia; Follow-up    HISTORY/CURRENT STATUS: HPI For routine med check.   She is doing really well well.  She was seen a couple of months ago and reported trouble sleeping.  She has started exercising regularly and that has helped a lot.  She is sleeping really well now, no problems going to sleep or staying asleep.  Her youngest son graduated from high school and has been accepted to the school of arts in [place].  She and her family are very excited.  He is very talented and was waitlisted at [employer].  So it is an exciting time.  Patient is able to enjoy things.  Energy and motivation are good.  Work is going well.   No extreme sadness, tearfulness, or feelings of hopelessness.  ADLs and personal hygiene are normal.   Denies any changes in concentration, making decisions, or remembering things.  Appetite has not changed.  Weight is stable.  Takes the Xanax in the evenings.  Not usually having panic attacks but does feel overwhelmed and she cannot get her mind to slow down without the Xanax.  Denies suicidal or homicidal thoughts.  Patient denies increased energy with decreased need for sleep, increased talkativeness, racing thoughts, impulsivity or risky behaviors, increased spending, increased libido, grandiosity, increased irritability or anger, paranoia, or hallucinations.  Denies dizziness, syncope, seizures, numbness, tingling, tremor, tics, unsteady gait, slurred speech, confusion. Denies muscle or joint pain, stiffness, or dystonia.  Individual Medical History/ Review of Systems: Changes? :No      Past medications for mental health diagnoses include: Ambien caused amnesia, Lunesta, Wellbutrin, Xanax, Melatonin causes h/a, Mirtazapine  Allergies:  Penicillins, Clindamycin, Codeine, Contrast media [iodinated contrast media], Lisinopril, Doxycycline, Erythromycin, Iohexol, and Neomycin  Current Medications:  Current Outpatient Medications:    albuterol (PROVENTIL HFA;VENTOLIN HFA) 108 (90 Base) MCG/ACT inhaler, Inhale 2 puffs into the lungs every 6 (six) hours as needed., Disp: , Rfl:    azelastine (ASTELIN) 0.1 % nasal spray, Place into both nostrils 2 (two) times daily as needed. Use in each nostril as directed, Disp: , Rfl:    cetirizine (ZYRTEC) 10 MG tablet, Take 10 mg by mouth at bedtime., Disp: , Rfl:    clindamycin-benzoyl peroxide (BENZACLIN) gel, , Disp: , Rfl:    estradiol (ESTRACE) 0.1 MG/GM vaginal cream, 1 gram vaginally twice weekly, Disp: 42.5 g, Rfl: 6   FLOVENT HFA 44 MCG/ACT inhaler, Inhale 2 puffs into the lungs 2 (two) times daily., Disp: , Rfl:    fluticasone (FLONASE SENSIMIST) 27.5 MCG/SPRAY nasal spray, , Disp: , Rfl:    L-Lysine HCl 1000 MG TABS, Take by mouth as needed., Disp: , Rfl:    Spacer/Aero-Holding Chambers (OPTICHAMBER DIAMOND) MISC, See admin instructions., Disp: , Rfl:    valACYclovir (VALTREX) 500 MG tablet, Take 1,000 mg by mouth 3 (three) times daily as needed. , Disp: , Rfl:    valsartan-hydrochlorothiazide (DIOVAN HCT) 160-25 MG tablet, Take 1 tablet by mouth daily., Disp: 90 tablet, Rfl: 3   ALPRAZolam (XANAX) 0.25 MG tablet, Take 0.5-2 tablets (0.125-0.5 mg total) by mouth 2 (two) times daily as needed for anxiety., Disp: 180 tablet, Rfl: 1   buPROPion (WELLBUTRIN XL) 300 MG 24 hr tablet, Take 1 tablet (300 mg total) by mouth daily., Disp: 90 tablet, Rfl: 1  EPINEPHrine 0.3 mg/0.3 mL IJ SOAJ injection, , Disp: , Rfl:    Eszopiclone 3 MG TABS, Take 1 tablet (3 mg total) by mouth at bedtime as needed. Take immediately before bedtime, Disp: 90 tablet, Rfl: 1 Medication Side Effects: none  Family Medical/ Social History: Changes?  No  MENTAL HEALTH EXAM:  There were no vitals taken for this  visit.There is no height or weight on file to calculate BMI.  General Appearance: Casual, Neat and Well Groomed  Eye Contact:  Good  Speech:  Clear and Coherent  Volume:  Normal  Mood:  Euthymic  Affect:  Appropriate  Thought Process:  Goal Directed and Descriptions of Associations: Circumstantial  Orientation:  Full (Time, Place, and Person)  Thought Content: Logical   Suicidal Thoughts:  No  Homicidal Thoughts:  No  Memory:  WNL  Judgement:  Good  Insight:  Good  Psychomotor Activity:  Normal  Concentration:  Concentration: Good and Attention Span: Good  Recall:  Good  Fund of Knowledge: Good  Language: Good  Assets:  Communication Skills Desire for Improvement Financial Resources/Insurance Housing Physical Health Transportation Vocational/Educational  ADL's:  Intact  Cognition: WNL  Prognosis:  Good   DIAGNOSES:    ICD-10-CM   1. Recurrent major depressive disorder, in full remission (HCC)  F33.42     2. Generalized anxiety disorder  F41.1     3. Insomnia, unspecified type  G47.00       Receiving Psychotherapy: No      RECOMMENDATIONS:  PDMP was reviewed.  Last Xanax was filled 05/13/2022.  Lunesta filled 11/10/2022. I provided 20 minutes of face to face time during this encounter, including time spent before and after the visit in records review, medical decision making, counseling pertinent to today's visit, and charting.   I am glad to see her doing better.  No changes in treatment needed.  Continue Xanax 0.25 mg, 1/2-1 twice daily as needed.   Continue Wellbutrin XL 300 mg 1 p.o. daily. Continue Lunesta 3 mg nightly as needed. Return in 6 months.  Melony Overly, PA-C

## 2022-11-16 ENCOUNTER — Ambulatory Visit: Payer: 59 | Admitting: Gastroenterology

## 2022-11-29 ENCOUNTER — Ambulatory Visit (INDEPENDENT_AMBULATORY_CARE_PROVIDER_SITE_OTHER): Payer: 59 | Admitting: Gastroenterology

## 2022-11-29 ENCOUNTER — Encounter: Payer: Self-pay | Admitting: Gastroenterology

## 2022-11-29 VITALS — BP 124/88 | HR 84 | Ht 65.0 in | Wt 150.0 lb

## 2022-11-29 DIAGNOSIS — K760 Fatty (change of) liver, not elsewhere classified: Secondary | ICD-10-CM

## 2022-11-29 NOTE — Patient Instructions (Addendum)
_______________________________________________________  If your blood pressure at your visit was 140/90 or greater, please contact your primary care physician to follow up on this.  _______________________________________________________  If you are age 49 or older, your body mass index should be between 23-30. Your Body mass index is 24.96 kg/m. If this is out of the aforementioned range listed, please consider follow up with your Primary Care Provider.  If you are age 31 or younger, your body mass index should be between 19-25. Your Body mass index is 24.96 kg/m. If this is out of the aformentioned range listed, please consider follow up with your Primary Care Provider.   ________________________________________________________  The [facility] providers would like to encourage you to use [facility] to communicate with providers for non-urgent requests or questions.  Due to long hold times on the telephone, sending your provider a message by [facility] may be a faster and more efficient way to get a response.  Please allow 48 business hours for a response.  Please remember that this is for non-urgent requests.  _______________________________________________________  Try to lose 6 pounds over the next 3 months by walking.   Avoid fatty foods and foods with high fructose corn syrup.  No sodas.   Follow up in 12 weeks.  Thank you,  Dr. Lynann Bologna

## 2022-11-29 NOTE — Progress Notes (Signed)
Chief Complaint: Abn LFTs  Referring Provider:  Doreene Nest, NP      ASSESSMENT AND PLAN;   #1. Abn LFTs- likely d/t fatty liver. R/O other causes. Neg ANA. acute viral hepatitis, autoimmune hepatitis panel (AMA, ASMA), iron studies, S. ceruloplasmin, A1AT, celiac screen and GGT. Immune to hepatitis A and B. Neg USE 3.4 (high probability of being normal-no fibrosis)   Plan: -Continue exercise protocol.  Continue or maintain weight.  -FU in 6 months.  Earlier, if needed.   HPI:    Shelley Figueroa is a 47 y.o. female  Very pleasant With HTN, HLD, anxiety  For follow-up.  Her most recent LFTs were normal.  They have responded well to weight loss.  She has been exercising every day.  She has also been watching her diet.  No nausea, vomiting, heartburn, regurgitation, odynophagia or dysphagia.  No significant diarrhea or constipation.  No melena or hematochezia. No unintentional weight loss. No abdominal pain.  No alcohol.       Latest Ref Rng & Units 08/31/2022    4:22 PM 08/10/2022    8:37 AM 02/23/2022    9:56 AM  Hepatic Function  Total Protein 6.0 - 8.3 g/dL 6.9  6.3  6.1   Albumin 3.5 - 5.2 g/dL 4.5  4.3  4.2   AST 0 - 37 U/L 19  44  29   ALT 0 - 35 U/L 35  74  35   Alk Phosphatase 39 - 117 U/L 77  94  71   Total Bilirubin 0.2 - 1.2 mg/dL 0.6  0.3  0.4   Bilirubin, Direct 0.00 - 0.40 mg/dL  <1.61      Has been on gluten restricted diet.  It improved her joint pains and general wellbeing.  Improved headaches as well.  No H/O itching, skin lesions, easy bruisability, intake of OTC meds including diet pills, herbal medications, anabolic steroids or Tylenol. There is no H/O blood transfusions, IVDA or FH of liver disease. No jaundice, dark urine or pale stools.  Past GI workup: Colon- neg 01/2020 (Dr Loreta Ave)- rpt 10 yrs.  USE 08/2022 -Probable fatty infiltration of liver as above. -Otherwise unremarkable ultrasound abdomen. 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  Older one is in the [employer] [employer], younger one just starting acting/drama school in [place] Past Medical History:  Diagnosis Date   Allergic rhinitis due to pollen    Anxiety    Chronic cystitis    Depression    Dyspareunia in female    Dyspnea on exertion    per pt hard to recover after exercise (cardiologist has echo and cardiac CT ordered when schedule is opened up)   Elevated LFTs 08/16/2022   Essential hypertension    cardioloigst-  dr t. [facility]-- FH premature CAD--- 08-18-2014 normal stress echo   Foot pain 02/15/2022   History of chronic bronchitis    History of HPV infection    remote   History of kidney stones    History of recurrent UTIs    Pure hypercholesterolemia 09/20/2018   Renal calculus, left    per pt non-obstructive    Past Surgical History:  Procedure Laterality Date   CESAREAN SECTION  2004, 2005   CYSTOSCOPY N/A 11/06/2018   Procedure: CYSTOSCOPY;  Surgeon: Jerene Bears, MD;  Location: [facility];  Service: Gynecology;  Laterality: N/A;   HYSTEROSCOPY WITH D & C N/A 04/27/2022   Procedure: DILATATION AND CURETTAGE /HYSTEROSCOPY/MYOSURE POLYP RESECTION;  Surgeon: Jerene Bears, MD;  Location: [facility];  Service: Gynecology;  Laterality: N/A;   INTRAUTERINE DEVICE (IUD) INSERTION N/A 11/06/2018   Procedure: INTRAUTERINE DEVICE (IUD) INSERTION WITH ULTRASOUND GUIDANCE;  Surgeon: Jerene Bears, MD;  Location: [facility];  Service: Gynecology;  Laterality: N/A;   INTRAUTERINE DEVICE (IUD) INSERTION N/A 04/27/2022   Procedure: INTRAUTERINE DEVICE (IUD) INSERTION;  Surgeon: Jerene Bears, MD;  Location[facility];  Service: Gynecology;  Laterality: N/A;   IUD REMOVAL N/A 11/06/2018   Procedure: INTRAUTERINE DEVICE (IUD) REMOVAL;   Surgeon: Jerene Bears, MD;  Location: [facility];  Service: Gynecology;  Laterality: N/A;  Mirena IUD removal and reinsertion with ultrasound guidance.   IUD REMOVAL N/A 04/27/2022   Procedure: INTRAUTERINE DEVICE (IUD) REMOVAL;  Surgeon: Jerene Bears, MD;  Location: [facility];  Service: Gynecology;  Laterality: N/A;   KNEE ARTHROSCOPY Right 1995   with left knee realignment   KNEE SURGERY Left 1991   NASAL SINUS SURGERY  2015   wtih septoplasty   SEPTOPLASTY     TONSILLECTOMY  1987    Family History  Problem Relation Age of Onset   Hypertension Mother    Hyperlipidemia Mother    Rheum arthritis Mother    Sjogren's syndrome Mother    Hypertension Father    Hyperlipidemia Father    Coronary artery disease Father        MI 56s, smoked, CABG, MULTIPLE STENTS   Bipolar disorder Sister    Alcohol abuse Sister    Depression Sister    Rheum arthritis Sister    Obesity Sister    Parkinson's disease Maternal Grandmother    Colon cancer Maternal Grandfather    Alzheimer's disease Paternal Grandmother    Liver disease Neg Hx    Esophageal cancer Neg Hx     Social History   Tobacco Use   Smoking status: Never   Smokeless tobacco: Never  Vaping Use   Vaping Use: Never used  Substance Use Topics   Alcohol use: Yes    Alcohol/week: 0.0 - 1.0 standard drinks of alcohol   Drug use: Never    Current Outpatient Medications  Medication Sig Dispense Refill   albuterol (PROVENTIL HFA;VENTOLIN HFA) 108 (90 Base) MCG/ACT inhaler Inhale 2 puffs into the lungs every 6 (six) hours as needed.     ALPRAZolam (XANAX) 0.25 MG tablet Take 0.5-2 tablets (0.125-0.5 mg total) by mouth 2 (two) times daily as needed for anxiety. 180 tablet 1   azelastine (ASTELIN) 0.1 % nasal spray Place into both nostrils 2 (two) times daily as needed. Use in each nostril as directed     buPROPion (WELLBUTRIN XL) 300 MG 24 hr tablet Take 1 tablet (300 mg total) by mouth daily. 90  tablet 1   cetirizine (ZYRTEC) 10 MG tablet Take 10 mg by mouth at bedtime.     clindamycin-benzoyl peroxide (BENZACLIN) gel      estradiol (ESTRACE) 0.1 MG/GM vaginal cream 1 gram vaginally twice weekly 42.5 g 6   Eszopiclone 3 MG TABS Take 1 tablet (3 mg total) by mouth at bedtime as needed. Take immediately before bedtime 90 tablet 1   FLOVENT HFA 44 MCG/ACT inhaler Inhale 2 puffs into the  lungs 2 (two) times daily.     fluticasone (FLONASE SENSIMIST) 27.5 MCG/SPRAY nasal spray      L-Lysine HCl 1000 MG TABS Take by mouth as needed.     Spacer/Aero-Holding Chambers [facility] DIAMOND) MISC See admin instructions.     valACYclovir (VALTREX) 500 MG tablet Take 1,000 mg by mouth 3 (three) times daily as needed.      valsartan-hydrochlorothiazide (DIOVAN HCT) 160-25 MG tablet Take 1 tablet by mouth daily. 90 tablet 3   EPINEPHrine 0.3 mg/0.3 mL IJ SOAJ injection  (Patient not taking: Reported on 11/15/2022)     No current facility-administered medications for this visit.    Allergies  Allergen Reactions   Penicillins Hives and Shortness Of Breath   Clindamycin Nausea And Vomiting   Codeine Itching   Contrast Media [Iodinated Contrast Media] Rash    Rash after cardiac CT   Lisinopril Other (See Comments)    cough   Doxycycline Nausea And Vomiting    Not a true allergy   Erythromycin Nausea And Vomiting   Iohexol Rash   Neomycin Nausea And Vomiting    Review of Systems:  neg     Physical Exam:    BP 124/88 (BP Location: Left Arm, Patient Position: Sitting, Cuff Size: Normal)   Pulse 84   Ht 5\' 5"  (1.651 m)   Wt 150 lb (68 kg)   BMI 24.96 kg/m  Wt Readings from Last 3 Encounters:  11/29/22 150 lb (68 kg)  08/31/22 150 lb (68 kg)  08/16/22 146 lb 4.8 oz (66.4 kg)   Constitutional:  Well-developed, in no acute distress. Psychiatric: Normal mood and affect. Behavior is normal. Abdominal: Soft, nondistended. Nontender. Bowel sounds active throughout. There are no masses  palpable.  Liver is palpated 2 cm below the costal margin.  Nontender.  Data Reviewed: I have personally reviewed following labs and imaging studies  CBC:    Latest Ref Rng & Units 08/31/2022    4:22 PM 04/27/2022    6:30 AM 04/27/2022    6:25 AM  CBC  WBC 4.0 - 10.5 K/uL 8.6  6.2    Hemoglobin 12.0 - 15.0 g/dL 16.1  09.6  04.5   Hematocrit 36.0 - 46.0 % 39.5  38.2  38.0   Platelets 150.0 - 400.0 K/uL 299.0  269      CMP:    Latest Ref Rng & Units 08/31/2022    4:22 PM 08/10/2022    8:37 AM 04/27/2022    6:25 AM  CMP  Glucose 70 - 99 mg/dL 98   97   BUN 6 - 23 mg/dL 15   14   Creatinine 4.09 - 1.20 mg/dL 8.11   9.14   Sodium 782 - 145 mEq/L 135   140   Potassium 3.5 - 5.1 mEq/L 3.6   3.5   Chloride 96 - 112 mEq/L 98   101   CO2 19 - 32 mEq/L 32     Calcium 8.4 - 10.5 mg/dL 9.4     Total Protein 6.0 - 8.3 g/dL 6.9  6.3    Total Bilirubin 0.2 - 1.2 mg/dL 0.6  0.3    Alkaline Phos 39 - 117 U/L 77  94    AST 0 - 37 U/L 19  44    ALT 0 - 35 U/L 35  74       Radiology Studies: No results found.    Edman Circle, MD 11/29/2022, 3:54 PM  Cc: Doreene Nest, NP

## 2022-12-16 ENCOUNTER — Encounter (HOSPITAL_BASED_OUTPATIENT_CLINIC_OR_DEPARTMENT_OTHER): Payer: Self-pay | Admitting: Obstetrics & Gynecology

## 2022-12-17 ENCOUNTER — Other Ambulatory Visit (HOSPITAL_BASED_OUTPATIENT_CLINIC_OR_DEPARTMENT_OTHER): Payer: Self-pay | Admitting: Obstetrics & Gynecology

## 2022-12-17 DIAGNOSIS — N941 Unspecified dyspareunia: Secondary | ICD-10-CM

## 2022-12-18 ENCOUNTER — Other Ambulatory Visit (HOSPITAL_BASED_OUTPATIENT_CLINIC_OR_DEPARTMENT_OTHER): Payer: Self-pay | Admitting: Obstetrics & Gynecology

## 2022-12-18 DIAGNOSIS — N941 Unspecified dyspareunia: Secondary | ICD-10-CM

## 2022-12-18 MED ORDER — ESTRADIOL 0.1 MG/GM VA CREA: TOPICAL_CREAM | VAGINAL | 3 refills | Status: DC

## 2023-01-30 ENCOUNTER — Other Ambulatory Visit (HOSPITAL_BASED_OUTPATIENT_CLINIC_OR_DEPARTMENT_OTHER): Payer: Self-pay | Admitting: Cardiovascular Disease

## 2023-01-31 NOTE — Telephone Encounter (Signed)
Rx request sent to pharmacy.  

## 2023-02-08 ENCOUNTER — Encounter: Payer: Self-pay | Admitting: Primary Care

## 2023-02-08 ENCOUNTER — Ambulatory Visit (INDEPENDENT_AMBULATORY_CARE_PROVIDER_SITE_OTHER): Payer: 59 | Admitting: Primary Care

## 2023-02-08 VITALS — BP 112/70 | HR 81 | Temp 98.1°F | Ht 65.0 in | Wt 145.0 lb

## 2023-02-08 DIAGNOSIS — G4700 Insomnia, unspecified: Secondary | ICD-10-CM

## 2023-02-08 DIAGNOSIS — E785 Hyperlipidemia, unspecified: Secondary | ICD-10-CM

## 2023-02-08 DIAGNOSIS — I10 Essential (primary) hypertension: Secondary | ICD-10-CM | POA: Diagnosis not present

## 2023-02-08 DIAGNOSIS — J4520 Mild intermittent asthma, uncomplicated: Secondary | ICD-10-CM | POA: Diagnosis not present

## 2023-02-08 DIAGNOSIS — R7989 Other specified abnormal findings of blood chemistry: Secondary | ICD-10-CM

## 2023-02-08 DIAGNOSIS — M25542 Pain in joints of left hand: Secondary | ICD-10-CM | POA: Diagnosis not present

## 2023-02-08 DIAGNOSIS — Z0000 Encounter for general adult medical examination without abnormal findings: Secondary | ICD-10-CM

## 2023-02-08 DIAGNOSIS — Z23 Encounter for immunization: Secondary | ICD-10-CM

## 2023-02-08 DIAGNOSIS — Z8249 Family history of ischemic heart disease and other diseases of the circulatory system: Secondary | ICD-10-CM

## 2023-02-08 DIAGNOSIS — M25541 Pain in joints of right hand: Secondary | ICD-10-CM | POA: Diagnosis not present

## 2023-02-08 DIAGNOSIS — F4323 Adjustment disorder with mixed anxiety and depressed mood: Secondary | ICD-10-CM

## 2023-02-08 DIAGNOSIS — Z0001 Encounter for general adult medical examination with abnormal findings: Secondary | ICD-10-CM

## 2023-02-08 LAB — COMPREHENSIVE METABOLIC PANEL
ALT: 28 U/L (ref 0–35)
AST: 21 U/L (ref 0–37)
Albumin: 4.2 g/dL (ref 3.5–5.2)
Alkaline Phosphatase: 68 U/L (ref 39–117)
BUN: 15 mg/dL (ref 6–23)
CO2: 33 meq/L — ABNORMAL HIGH (ref 19–32)
Calcium: 9.2 mg/dL (ref 8.4–10.5)
Chloride: 101 meq/L (ref 96–112)
Creatinine, Ser: 0.96 mg/dL (ref 0.40–1.20)
GFR: 69.52 mL/min (ref >60.00)
Glucose, Bld: 88 mg/dL (ref 70–99)
Potassium: 3.4 meq/L — ABNORMAL LOW (ref 3.5–5.1)
Sodium: 141 meq/L (ref 135–145)
Total Bilirubin: 0.7 mg/dL (ref 0.2–1.2)
Total Protein: 6.6 g/dL (ref 6.0–8.3)

## 2023-02-08 LAB — LIPID PANEL
Cholesterol: 244 mg/dL — ABNORMAL HIGH (ref 0–200)
HDL: 57.6 mg/dL (ref >39.00)
LDL Cholesterol: 155 mg/dL — ABNORMAL HIGH (ref 0–99)
NonHDL: 186.44
Total CHOL/HDL Ratio: 4
Triglycerides: 157 mg/dL — ABNORMAL HIGH (ref 0.0–149.0)
VLDL: 31.4 mg/dL (ref 0.0–40.0)

## 2023-02-08 LAB — URIC ACID: Uric Acid, Serum: 5 mg/dL (ref 2.4–7.0)

## 2023-02-08 LAB — SEDIMENTATION RATE: Sed Rate: 8 mm/h (ref 0–20)

## 2023-02-08 LAB — C-REACTIVE PROTEIN: CRP: <1 mg/dL (ref 0.5–20.0)

## 2023-02-08 NOTE — Assessment & Plan Note (Signed)
Immunizations UTD. Influenza vaccine provided today. Pap smear UTD.  Follows with GYN Mammogram UTD Colonoscopy UTD, due 2031  Discussed the importance of a healthy diet and regular exercise in order for weight loss, and to reduce the risk of further co-morbidity.  Exam stable. Labs pending and also reviewed.  Follow up in 1 year for repeat physical.

## 2023-02-08 NOTE — Assessment & Plan Note (Signed)
Following with GI. Reviewed office notes and labs from April 2024 and office notes from July 2024.  Repeat LFTs pending.

## 2023-02-08 NOTE — Assessment & Plan Note (Signed)
Controlled. Following with psychiatry.  Continue Lunesta 3 mg at bedtime, alprazolam 0.25 mg twice daily as needed, bupropion XL 300 mg daily.

## 2023-02-08 NOTE — Assessment & Plan Note (Signed)
Following with cardiology, office notes reviewed from March 2024.

## 2023-02-08 NOTE — Progress Notes (Signed)
Subjective:    Patient ID: Shelley Figueroa, female    DOB: 10/16/1975, 48 y.o.   MRN: [id number]  HPI  Shelley Figueroa is a very pleasant 47 y.o. female who presents today for complete physical and follow up of chronic conditions.  She has not been seen by me since September 2022.  She would also like to discuss joint pain. Her pain is chronic to her entire body. She stopped eating gluten in 2017 which relieved her joint pain until now. Over the last 6 months she's noticed increased pain to her bilateral hands and some disfigurement. 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 She has tested negative for RA in the past, last testing was 2 years ago.   She follow with podiatry, diagnosed with plantar fasciitis. She leads an active lifestyle and has lost 5 pounds.   Immunizations: -Tetanus: Completed in 2019 -Influenza: Influenza vaccine provided today.  Diet: Fair diet.  Exercise: Walking 5 times weekly with her dog, yoga  Eye exam: Completes annually  Dental exam: Completes three times annually    Pap Smear: November 2022 Mammogram: March 2024  Colonoscopy: Completed in 2021, due 2031  BP Readings from Last 3 Encounters:  02/08/23 112/70  11/29/22 124/88  08/31/22 120/88   Wt Readings from Last 3 Encounters:  02/08/23 145 lb (65.8 kg)  11/29/22 150 lb (68 kg)  08/31/22 150 lb (68 kg)       Review of Systems  Constitutional:  Negative for unexpected weight change.  HENT:  Negative for rhinorrhea.   Respiratory:  Negative for cough and shortness of breath.   Cardiovascular:  Negative for chest pain.  Gastrointestinal:  Negative for constipation and diarrhea.  Genitourinary:  Negative for difficulty urinating.  Musculoskeletal:  Positive for arthralgias.  Skin:  Negative for rash.  Allergic/Immunologic: Negative for environmental allergies.  Neurological:  Negative for dizziness and headaches.   Psychiatric/Behavioral:  The patient is nervous/anxious.          Past Medical History:  Diagnosis Date   Allergic rhinitis due to pollen    Anxiety    Chronic cystitis    COVID-19 virus infection 05/12/2021   Depression    Dyspareunia in female    Dyspnea on exertion    per pt hard to recover after exercise (cardiologist has echo and cardiac CT ordered when schedule is opened up)   Elevated LFTs 08/16/2022   Essential hypertension    cardioloigst-  dr t. [facility]-- FH premature CAD--- 08-18-2014 normal stress echo   Foot pain 02/15/2022   History of chronic bronchitis    History of HPV infection    remote   History of kidney stones    History of recurrent UTIs    Pure hypercholesterolemia 09/20/2018   Renal calculus, left    per pt non-obstructive    Social History   Socioeconomic History   Marital status: Married    Spouse name: Mardelle Matte   Number of children: 2   Years of education: Not on file   Highest education level: Bachelor's degree (e.g., BA, AB, BS)  Occupational History   Occupation: Executive    Comment: [employer]  Tobacco Use   Smoking status: Never   Smokeless tobacco: Never  Vaping Use   Vaping status: Never Used  Substance and Sexual Activity   Alcohol use: Yes    Alcohol/week: 0.0 - 1.0 standard drinks of alcohol   Drug use:  Never   Sexual activity: Yes    Partners: Male    Birth control/protection: I.U.D.    Comment: Mirena placed 11/06/18  Other Topics Concern   Not on file  Social History Narrative   From [place] originally   [employer], [employer] of 1996   Married to [place]   2 children   Social Determinants of Health   Financial Resource Strain: Low Risk  (02/07/2023)   Overall Financial Resource Strain (CARDIA)    Difficulty of Paying Living Expenses: Not hard at all  Food Insecurity: No Food Insecurity (02/07/2023)   Hunger Vital Sign    Worried About Running Out of Food in the Last Year: Never true    Ran Out of Food  in the Last Year: Never true  Transportation Needs: No Transportation Needs (02/07/2023)   PRAPARE - Administrator, Civil Service (Medical): No    Lack of Transportation (Non-Medical): No  Physical Activity: Sufficiently Active (02/07/2023)   Exercise Vital Sign    Days of Exercise per Week: 5 days    Minutes of Exercise per Session: 30 min  Stress: Stress Concern Present (02/07/2023)   [employer] of Occupational Health - Occupational Stress Questionnaire    Feeling of Stress : Very much  Social Connections: Moderately Isolated (02/07/2023)   Social Connection and Isolation Panel [NHANES]    Frequency of Communication with Friends and Family: More than three times a week    Frequency of Social Gatherings with Friends and Family: Twice a week    Attends Religious Services: Never    Database administrator or Organizations: No    Attends Engineer, structural: Not on file    Marital Status: Married  Intimate Partner Violence: Not on file    Past Surgical History:  Procedure Laterality Date   CESAREAN SECTION  2004, 2005   CYSTOSCOPY N/A 11/06/2018   Procedure: CYSTOSCOPY;  Surgeon: Jerene Bears, MD;  Location: [facility];  Service: Gynecology;  Laterality: N/A;   HYSTEROSCOPY WITH D & C N/A 04/27/2022   Procedure: DILATATION AND CURETTAGE /HYSTEROSCOPY/MYOSURE POLYP RESECTION;  Surgeon: Jerene Bears, MD;  Location: [facility];  Service: Gynecology;  Laterality: N/A;   INTRAUTERINE DEVICE (IUD) INSERTION N/A 11/06/2018   Procedure: INTRAUTERINE DEVICE (IUD) INSERTION WITH ULTRASOUND GUIDANCE;  Surgeon: Jerene Bears, MD;  Location: [facility];  Service: Gynecology;  Laterality: N/A;   INTRAUTERINE DEVICE (IUD) INSERTION N/A 04/27/2022   Procedure: INTRAUTERINE DEVICE (IUD) INSERTION;  Surgeon: Jerene Bears, MD;  Location: [facility];  Service: Gynecology;  Laterality: N/A;   IUD REMOVAL N/A  11/06/2018   Procedure: INTRAUTERINE DEVICE (IUD) REMOVAL;  Surgeon: Jerene Bears, MD;  Location: [facility];  Service: Gynecology;  Laterality: N/A;  Mirena IUD removal and reinsertion with ultrasound guidance.   IUD REMOVAL N/A 04/27/2022   Procedure: INTRAUTERINE DEVICE (IUD) REMOVAL;  Surgeon: Jerene Bears, MD;  Location: [facility];  Service: Gynecology;  Laterality: N/A;   KNEE ARTHROSCOPY Right 1995   with left knee realignment   KNEE SURGERY Left 1991   NASAL SINUS SURGERY  2015   wtih septoplasty   SEPTOPLASTY     TONSILLECTOMY  1987    Family History  Problem Relation Age of Onset   Hypertension Mother    Hyperlipidemia Mother    Rheum arthritis Mother    Sjogren's syndrome Mother  Hypertension Father    Hyperlipidemia Father    Coronary artery disease Father        MI 49s, smoked, CABG, MULTIPLE STENTS   Bipolar disorder Sister    Alcohol abuse Sister    Depression Sister    Rheum arthritis Sister    Obesity Sister    Parkinson's disease Maternal Grandmother    Colon cancer Maternal Grandfather    Alzheimer's disease Paternal Grandmother    Liver disease Neg Hx    Esophageal cancer Neg Hx     Allergies  Allergen Reactions   Penicillins Hives and Shortness Of Breath   Clindamycin Nausea And Vomiting   Codeine Itching   Contrast Media [Iodinated Contrast Media] Rash    Rash after cardiac CT   Lisinopril Other (See Comments)    cough   Doxycycline Nausea And Vomiting    Not a true allergy   Erythromycin Nausea And Vomiting   Iohexol Rash   Neomycin Nausea And Vomiting    Current Outpatient Medications on File Prior to Visit  Medication Sig Dispense Refill   albuterol (PROVENTIL HFA;VENTOLIN HFA) 108 (90 Base) MCG/ACT inhaler Inhale 2 puffs into the lungs every 6 (six) hours as needed.     ALPRAZolam (XANAX) 0.25 MG tablet Take 0.5-2 tablets (0.125-0.5 mg total) by mouth 2 (two) times daily as needed for anxiety. 180  tablet 1   azelastine (ASTELIN) 0.1 % nasal spray Place into both nostrils 2 (two) times daily as needed. Use in each nostril as directed     buPROPion (WELLBUTRIN XL) 300 MG 24 hr tablet Take 1 tablet (300 mg total) by mouth daily. 90 tablet 1   cetirizine (ZYRTEC) 10 MG tablet Take 10 mg by mouth at bedtime.     clindamycin-benzoyl peroxide (BENZACLIN) gel      estradiol (ESTRACE) 0.1 MG/GM vaginal cream 1 gram vaginally twice weekly 42.5 g 3   Eszopiclone 3 MG TABS Take 1 tablet (3 mg total) by mouth at bedtime as needed. Take immediately before bedtime 90 tablet 1   FLOVENT HFA 44 MCG/ACT inhaler Inhale 2 puffs into the lungs 2 (two) times daily.     fluticasone (FLONASE SENSIMIST) 27.5 MCG/SPRAY nasal spray      L-Lysine HCl 1000 MG TABS Take by mouth as needed.     Spacer/Aero-Holding Chambers [facility] DIAMOND) MISC See admin instructions.     valACYclovir (VALTREX) 500 MG tablet Take 1,000 mg by mouth 3 (three) times daily as needed.      valsartan-hydrochlorothiazide (DIOVAN-HCT) 160-25 MG tablet TAKE 1 TABLET BY MOUTH EVERY DAY 90 tablet 1   EPINEPHrine 0.3 mg/0.3 mL IJ SOAJ injection  (Patient not taking: Reported on 11/15/2022)     No current facility-administered medications on file prior to visit.    BP 112/70   Pulse 81   Temp 98.1 F (36.7 C) (Temporal)   Ht 5\' 5"  (1.651 m)   Wt 145 lb (65.8 kg)   SpO2 99%   BMI 24.13 kg/m  Objective:   Physical Exam HENT:     Right Ear: Tympanic membrane and ear canal normal.     Left Ear: Tympanic membrane and ear canal normal.     Nose: Nose normal.  Eyes:     Conjunctiva/sclera: Conjunctivae normal.     Pupils: Pupils are equal, round, and reactive to light.  Neck:     Thyroid: No thyromegaly.  Cardiovascular:     Rate and Rhythm: Normal rate and regular rhythm.  Heart sounds: No murmur heard. Pulmonary:     Effort: Pulmonary effort is normal.     Breath sounds: Normal breath sounds. No rales.  Abdominal:      General: Bowel sounds are normal.     Palpations: Abdomen is soft.     Tenderness: There is no abdominal tenderness.  Musculoskeletal:        General: Normal range of motion.     Cervical back: Neck supple.     Comments: Heberden's nodes to bilateral hands at PIP joints, most prominent at 2nd digits  Lymphadenopathy:     Cervical: No cervical adenopathy.  Skin:    General: Skin is warm and dry.     Findings: No rash.  Neurological:     Mental Status: She is alert and oriented to person, place, and time.     Cranial Nerves: No cranial nerve deficit.     Deep Tendon Reflexes: Reflexes are normal and symmetric.  Psychiatric:        Mood and Affect: Mood normal.           Assessment & Plan:  Encounter for annual general medical examination with abnormal findings in adult Assessment & Plan: Immunizations UTD. Influenza vaccine provided today. Pap smear UTD.  Follows with GYN Mammogram UTD Colonoscopy UTD, due 2031  Discussed the importance of a healthy diet and regular exercise in order for weight loss, and to reduce the risk of further co-morbidity.  Exam stable. Labs pending and also reviewed.  Follow up in 1 year for repeat physical.    Mild intermittent chronic asthma without complication Assessment & Plan: Controlled.  Following with pulmonology. Continue Flovent 44 mcg, 2 puffs BID. Continue albuterol inhaler PRN.   Essential hypertension Assessment & Plan: Controlled.  Continue valsartan-hydrochlorothiazide 160-25 mg daily. Following with cardiology, office notes reviewed from March 2024.  Reviewed CMP from April 2024.  Orders: -     Comprehensive metabolic panel  Joint pain in both hands Assessment & Plan: Chronic and deteriorated.  Rechecking labs today including RF, CCP, sed rate, CRP, uric acid. Referral placed to rheumatology for further evaluation given progression.  Orders: -     Uric acid -     C-reactive protein -     Rheumatoid  factor -     Sedimentation rate -     Cyclic citrul peptide antibody, IgG -     Ambulatory referral to Rheumatology  Hyperlipidemia, unspecified hyperlipidemia type Assessment & Plan: Repeat lipid panel pending. Following with cardiology.  Orders: -     Lipid panel  ADJ DISORDER WITH MIXED ANXIETY & DEPRESSED MOOD Assessment & Plan: Controlled. Following with psychiatry.  Continue Lunesta 3 mg at bedtime, alprazolam 0.25 mg twice daily as needed, bupropion XL 300 mg daily.   Elevated LFTs Assessment & Plan: Following with GI. Reviewed office notes and labs from April 2024 and office notes from July 2024.  Repeat LFTs pending.   Family history of coronary artery disease in father Assessment & Plan: Following with cardiology, office notes reviewed from March 2024.   Insomnia, unspecified type Assessment & Plan: Controlled. Following with psychiatry.  Continue Lunesta 3 mg as needed.         Doreene Nest, NP

## 2023-02-08 NOTE — Assessment & Plan Note (Signed)
Controlled.  Continue valsartan-hydrochlorothiazide 160-25 mg daily. Following with cardiology, office notes reviewed from March 2024.  Reviewed CMP from April 2024.

## 2023-02-08 NOTE — Assessment & Plan Note (Signed)
Controlled. Following with psychiatry.  Continue Lunesta 3 mg as needed.

## 2023-02-08 NOTE — Assessment & Plan Note (Signed)
Chronic and deteriorated.  Rechecking labs today including RF, CCP, sed rate, CRP, uric acid. Referral placed to rheumatology for further evaluation given progression.

## 2023-02-08 NOTE — Assessment & Plan Note (Signed)
Repeat lipid panel pending. Following with cardiology.

## 2023-02-08 NOTE — Patient Instructions (Signed)
Stop by the lab prior to leaving today. I will notify you of your results once received.   You will either be contacted via phone regarding your referral to rheumatology, or you may receive a letter on your MyChart portal from our referral team with instructions for scheduling an appointment. Please let us know if you have not been contacted by anyone within two weeks.  It was a pleasure to see you today!

## 2023-02-08 NOTE — Assessment & Plan Note (Signed)
Controlled.  Following with pulmonology. Continue Flovent 44 mcg, 2 puffs BID. Continue albuterol inhaler PRN.

## 2023-02-10 LAB — CYCLIC CITRUL PEPTIDE ANTIBODY, IGG: Cyclic Citrullin Peptide Ab: <16 U

## 2023-02-10 LAB — RHEUMATOID FACTOR: Rheumatoid fact SerPl-aCnc: 10 [IU]/mL (ref <14)

## 2023-05-02 ENCOUNTER — Ambulatory Visit (HOSPITAL_BASED_OUTPATIENT_CLINIC_OR_DEPARTMENT_OTHER): Payer: 59 | Admitting: Obstetrics & Gynecology

## 2023-05-17 ENCOUNTER — Ambulatory Visit: Payer: 59 | Admitting: Physician Assistant

## 2023-05-17 ENCOUNTER — Encounter: Payer: Self-pay | Admitting: Physician Assistant

## 2023-05-17 DIAGNOSIS — G4700 Insomnia, unspecified: Secondary | ICD-10-CM

## 2023-05-17 DIAGNOSIS — F411 Generalized anxiety disorder: Secondary | ICD-10-CM

## 2023-05-17 DIAGNOSIS — F3342 Major depressive disorder, recurrent, in full remission: Secondary | ICD-10-CM

## 2023-05-17 MED ORDER — ALPRAZOLAM 0.25 MG PO TABS: 0.1250 mg | ORAL_TABLET | Freq: Two times a day (BID) | ORAL | 1 refills | Status: DC | PRN

## 2023-05-17 MED ORDER — ZALEPLON 10 MG PO CAPS: ORAL_CAPSULE | ORAL | 0 refills | Status: DC

## 2023-05-17 NOTE — Progress Notes (Signed)
[facility]  Patient ID: Shelley Figueroa,  MRN: [id number]  PCP: Doreene Nest, NP  Date of Evaluation: 05/17/2023 Time spent:25 minutes  Chief Complaint:  Chief Complaint   Anxiety; Depression; Insomnia; Follow-up    HISTORY/CURRENT STATUS: HPI For routine med check.   Is working with a Systems analyst three times a week now and enjoying that. Her youngest son started his freshman year at [employer] in [place]. Her oldest is at [employer]. They're doing well so that makes her happy.   Patient is able to enjoy things.  Energy and motivation are good.  Work is going well.   No extreme sadness, tearfulness, or feelings of hopelessness. She has trouble both going to sleep and falling asleep.  The Lunesta isn't working as well as it once did. ADLs and personal hygiene are normal.   Denies any changes in concentration, making decisions, or remembering things.  Appetite has not changed.  Weight is stable.  Denies laxative use, calorie restricting, or binging and purging.   Denies cutting or any form of self-harm.  Anxiety is well-controlled.  Otilio Carpen takes the Xanax.  Denies suicidal or homicidal thoughts.  Patient denies increased energy with decreased need for sleep, increased talkativeness, racing thoughts, impulsivity or risky behaviors, increased spending, increased libido, grandiosity, increased irritability or anger, paranoia, or hallucinations.  Denies dizziness, syncope, seizures, numbness, tingling, tremor, tics, unsteady gait, slurred speech, confusion. Denies muscle or joint pain, stiffness, or dystonia.  Individual Medical History/ Review of Systems: Changes? :No      Past medications for mental health diagnoses include: Ambien caused amnesia, Lunesta, Wellbutrin, Xanax, Melatonin causes h/a, Mirtazapine  Allergies: Penicillins, Clindamycin, Codeine, Contrast media [iodinated contrast media], Lisinopril, Doxycycline, Erythromycin, Iohexol, and  Neomycin  Current Medications:  Current Outpatient Medications:    albuterol (PROVENTIL HFA;VENTOLIN HFA) 108 (90 Base) MCG/ACT inhaler, Inhale 2 puffs into the lungs every 6 (six) hours as needed., Disp: , Rfl:    azelastine (ASTELIN) 0.1 % nasal spray, Place into both nostrils 2 (two) times daily as needed. Use in each nostril as directed, Disp: , Rfl:    buPROPion (WELLBUTRIN XL) 300 MG 24 hr tablet, Take 1 tablet (300 mg total) by mouth daily., Disp: 90 tablet, Rfl: 1   cetirizine (ZYRTEC) 10 MG tablet, Take 10 mg by mouth at bedtime., Disp: , Rfl:    clindamycin-benzoyl peroxide (BENZACLIN) gel, , Disp: , Rfl:    estradiol (ESTRACE) 0.1 MG/GM vaginal cream, 1 gram vaginally twice weekly, Disp: 42.5 g, Rfl: 3   Eszopiclone 3 MG TABS, Take 1 tablet (3 mg total) by mouth at bedtime as needed. Take immediately before bedtime, Disp: 90 tablet, Rfl: 1   FLOVENT HFA 44 MCG/ACT inhaler, Inhale 2 puffs into the lungs 2 (two) times daily., Disp: , Rfl:    fluticasone (FLONASE SENSIMIST) 27.5 MCG/SPRAY nasal spray, , Disp: , Rfl:    L-Lysine HCl 1000 MG TABS, Take by mouth as needed., Disp: , Rfl:    Spacer/Aero-Holding Chambers (OPTICHAMBER DIAMOND) MISC, See admin instructions., Disp: , Rfl:    valACYclovir (VALTREX) 500 MG tablet, Take 1,000 mg by mouth 3 (three) times daily as needed. , Disp: , Rfl:    valsartan-hydrochlorothiazide (DIOVAN-HCT) 160-25 MG tablet, TAKE 1 TABLET BY MOUTH EVERY DAY, Disp: 90 tablet, Rfl: 1   zaleplon (SONATA) 10 MG capsule, 1 po at bedtime prn and may repeat 1 po for mid-nocturnal awakening as long as she has 3-4 hours left to  sleep., Disp: 60 capsule, Rfl: 0   ALPRAZolam (XANAX) 0.25 MG tablet, Take 0.5-2 tablets (0.125-0.5 mg total) by mouth 2 (two) times daily as needed for anxiety., Disp: 180 tablet, Rfl: 1   EPINEPHrine 0.3 mg/0.3 mL IJ SOAJ injection, , Disp: , Rfl:  Medication Side Effects: none  Family Medical/ Social History: Changes?  No  MENTAL HEALTH  EXAM:  There were no vitals taken for this visit.There is no height or weight on file to calculate BMI.  General Appearance: Casual, Neat and Well Groomed  Eye Contact:  Good  Speech:  Clear and Coherent  Volume:  Normal  Mood:  Euthymic  Affect:  Appropriate  Thought Process:  Goal Directed and Descriptions of Associations: Circumstantial  Orientation:  Full (Time, Place, and Person)  Thought Content: Logical   Suicidal Thoughts:  No  Homicidal Thoughts:  No  Memory:  WNL  Judgement:  Good  Insight:  Good  Psychomotor Activity:  Normal  Concentration:  Concentration: Good and Attention Span: Good  Recall:  Good  Fund of Knowledge: Good  Language: Good  Assets:  Communication Skills Desire for Improvement Financial Resources/Insurance Housing Physical Health Resilience Transportation Vocational/Educational  ADL's:  Intact  Cognition: WNL  Prognosis:  Good   DIAGNOSES:    ICD-10-CM   1. Generalized anxiety disorder  F41.1     2. Insomnia, unspecified type  G47.00     3. Recurrent major depressive disorder, in full remission (HCC)  F33.42      Receiving Psychotherapy: No      RECOMMENDATIONS:  PDMP was reviewed.  Last Xanax was filled 05/03/2023.  Lunesta filled 03/17/2023.. I provided 25 minutes of face to face time during this encounter, including time spent before and after the visit in records review, medical decision making, counseling pertinent to today's visit, and charting.   Discussed the sleep issues. She may have become tolerant to the [place].  Due to the lack of quality sleep and the fact that wakes up often, I recommend changing Lunesta to Kyle. Pros and cons disc. Approx 17 minutes spent in counseling concerning sleep hygiene.  Call in a month or so to let me know if the Kathaleen Bury is working well, will send in prescription for that.  If not I will send in [place].   D/C Lunesta.  Continue Xanax 0.25 mg, 1/2-1 twice daily as needed.   Continue Wellbutrin  XL 300 mg 1 p.o. daily. Start Sonata 10 mg, 1 at bedtime prn sleep and may repeat 1 for midnocturnal awakening prn, as long as she has 3-4 hours left to sleep.  Continue healthy lifestyle choices.  Return in 6 months.  Melony Overly, PA-C

## 2023-06-02 ENCOUNTER — Telehealth: Payer: Self-pay | Admitting: Physician Assistant

## 2023-06-02 ENCOUNTER — Other Ambulatory Visit: Payer: Self-pay | Admitting: Physician Assistant

## 2023-06-02 MED ORDER — ESZOPICLONE 3 MG PO TABS: 3.0000 mg | ORAL_TABLET | Freq: Every evening | ORAL | 1 refills | Status: DC | PRN

## 2023-06-02 NOTE — Telephone Encounter (Signed)
 Patient said she prefers [place] over [employer]. She last filled Sonata 12/19. Do you want her to bring in the [place] before send Rx for Lunesta?

## 2023-06-02 NOTE — Telephone Encounter (Signed)
 I went ahead and sent in the [facility]. Have her bring in the [place] next week.

## 2023-06-02 NOTE — Telephone Encounter (Signed)
 PT prefers to take the Lunesta. Sonata did not work out. Can we send in a RF on Lunesta for her to [facility] on [address]?

## 2023-06-05 NOTE — Telephone Encounter (Signed)
 Patient notified to bring in [place] since Paul was sent in.

## 2023-06-06 ENCOUNTER — Encounter (HOSPITAL_BASED_OUTPATIENT_CLINIC_OR_DEPARTMENT_OTHER): Payer: Self-pay | Admitting: Obstetrics & Gynecology

## 2023-06-06 ENCOUNTER — Ambulatory Visit (INDEPENDENT_AMBULATORY_CARE_PROVIDER_SITE_OTHER): Payer: 59 | Admitting: Obstetrics & Gynecology

## 2023-06-06 VITALS — BP 143/83 | HR 79 | Ht 66.25 in | Wt 154.0 lb

## 2023-06-06 DIAGNOSIS — N941 Unspecified dyspareunia: Secondary | ICD-10-CM

## 2023-06-06 DIAGNOSIS — L723 Sebaceous cyst: Secondary | ICD-10-CM | POA: Diagnosis not present

## 2023-06-06 DIAGNOSIS — N912 Amenorrhea, unspecified: Secondary | ICD-10-CM | POA: Diagnosis not present

## 2023-06-06 MED ORDER — ESTRADIOL 0.1 MG/GM VA CREA: 1.0000 g | TOPICAL_CREAM | VAGINAL | 3 refills | Status: DC

## 2023-06-06 NOTE — Progress Notes (Addendum)
 GYNECOLOGY  VISIT  CC:   dyspareunia  HPI: 48 y.o. G35P1102 Married White or Caucasian female here for discussion of worsening issues with dyspareunia.  Having a burning sensation.  Vaginal estrogen cream has helped in the past and she is still using it twice weekly.  This occurs about 80% of the time that she and her husband have intercourse but not always.  They are using a lot of lubricant as well.  There are marital concerns as well.  We discussed concerns today.  She is seeing a therapist.  Has IUD and basically no bleeding.  Discussed possible changes with perimenopause that could be contributing.     Past Medical History:  Diagnosis Date   Allergic rhinitis due to pollen    Anxiety    Chronic cystitis    COVID-19 virus infection 05/12/2021   Depression    Dyspareunia in female    Dyspnea on exertion    per pt hard to recover after exercise (cardiologist has echo and cardiac CT ordered when schedule is opened up)   Elevated LFTs 08/16/2022   Essential hypertension    cardioloigst-  dr t. raford-- FH premature CAD--- 08-18-2014 normal stress echo   Foot pain 02/15/2022   History of chronic bronchitis    History of HPV infection    remote   History of kidney stones    History of recurrent UTIs    Pure hypercholesterolemia 09/20/2018   Renal calculus, left    per pt non-obstructive    MEDS:   Current Outpatient Medications on File Prior to Visit  Medication Sig Dispense Refill   albuterol  (PROVENTIL  HFA;VENTOLIN  HFA) 108 (90 Base) MCG/ACT inhaler Inhale 2 puffs into the lungs every 6 (six) hours as needed.     ALPRAZolam  (XANAX ) 0.25 MG tablet Take 0.5-2 tablets (0.125-0.5 mg total) by mouth 2 (two) times daily as needed for anxiety. 180 tablet 1   azelastine (ASTELIN) 0.1 % nasal spray Place into both nostrils 2 (two) times daily as needed. Use in each nostril as directed     buPROPion  (WELLBUTRIN  XL) 300 MG 24 hr tablet Take 1 tablet (300 mg total) by mouth daily. 90  tablet 1   cetirizine (ZYRTEC) 10 MG tablet Take 10 mg by mouth at bedtime.     clindamycin-benzoyl peroxide (BENZACLIN) gel      EPINEPHrine  0.3 mg/0.3 mL IJ SOAJ injection      estradiol  (ESTRACE ) 0.1 MG/GM vaginal cream 1 gram vaginally twice weekly 42.5 g 3   Eszopiclone  3 MG TABS Take 1 tablet (3 mg total) by mouth at bedtime as needed. Take immediately before bedtime 90 tablet 1   FLOVENT HFA 44 MCG/ACT inhaler Inhale 2 puffs into the lungs 2 (two) times daily.     fluticasone (FLONASE SENSIMIST) 27.5 MCG/SPRAY nasal spray      L-Lysine HCl 1000 MG TABS Take by mouth as needed.     Spacer/Aero-Holding Chambers [facility] DIAMOND) MISC See admin instructions.     valACYclovir (VALTREX) 500 MG tablet Take 1,000 mg by mouth 3 (three) times daily as needed.      valsartan -hydrochlorothiazide  (DIOVAN -HCT) 160-25 MG tablet TAKE 1 TABLET BY MOUTH EVERY DAY 90 tablet 1   No current facility-administered medications on file prior to visit.    ALLERGIES: Penicillins, Clindamycin, Codeine, Contrast media [iodinated contrast media], Lisinopril , Doxycycline , Erythromycin, Iohexol , and Neomycin  SH:  married, non smoker  Review of Systems  Constitutional: Negative.   Genitourinary:  Pain with intercourse    PHYSICAL EXAMINATION:    BP (!) 143/83 (BP Location: Left Arm, Patient Position: Sitting, Cuff Size: Large)   Pulse 79   Ht 5' 6.25 (1.683 m)   Wt 154 lb (69.9 kg)   BMI 24.67 kg/m     General appearance: alert, cooperative and appears stated age Lymph:  no inguinal LAD noted  Pelvic: External genitalia:  no lesions              Urethra:  normal appearing urethra with no masses, tenderness or lesions              Bartholins and Skenes: normal                 Vagina: normal mucosa without prolapse or lesions              Cervix: no lesions              Bimanual Exam:  Uterus:  normal size, contour, position, consistency, mobility, non-tender              Adnexa: no mass,  fullness, tenderness   Anus:  1cm perianal sebaceous cyst              Chaperone not requested for exam  Assessment/Plan: 1. Dyspareunia in female (Primary) - possible therapists for specific marital concerns discussed - Ambulatory referral to Physical Therapy - estradiol  (ESTRACE ) 0.1 MG/GM vaginal cream; Place 1 g vaginally 3 (three) times a week. 1 gram vaginally twice weekly  Dispense: 42.5 g; Refill: 3  2. Amenorrhea - will check for menopausal status as would use HRT with this pt is tolerated - Follicle stimulating hormone - Estradiol    3. Sebaceous cyst - pt would like ot have this removed. - referral to general surgery done   Total time with pt: 35 minutes Additional documentation time:  3 minutes Total time: 38 minutes

## 2023-06-07 ENCOUNTER — Encounter (HOSPITAL_BASED_OUTPATIENT_CLINIC_OR_DEPARTMENT_OTHER): Payer: Self-pay | Admitting: Obstetrics & Gynecology

## 2023-06-07 LAB — FOLLICLE STIMULATING HORMONE: FSH: 65.9 m[IU]/mL

## 2023-06-07 NOTE — Addendum Note (Signed)
 Addended by: Jerene Bears on: 06/07/2023 01:57 PM   Modules accepted: Orders

## 2023-06-22 NOTE — Progress Notes (Signed)
 Office Visit Note  Patient: Shelley Figueroa             Date of Birth: 01/08/1976           MRN: [id number]             PCP: Gretta Comer POUR, NP Referring: Gretta Comer POUR, NP Visit Date: 07/06/2023 Occupation: @GUAROCC @  Subjective:  Pain in multiple joints  History of Present Illness: Shelley Figueroa is a 48 y.o. female seen in consultation.  [facility] of her PCP.  According to the patient her symptoms started in 2016 with increased pain in multiple joints.  She states she tried gluten-free diet and her symptoms improved.  When she introduce gluten she noticed recurrence of pain.  She has been on gluten-free diet since then.  She states that February 2024 she started experiencing increased pain and discomfort in her joints.  She has been noticing increased pain in intermittent swelling in her hands and also noticed deformity in her hands.  She states she has noticed decreased grip strength over time.  She also has discomfort in her feet.  Besides that she also feels some discomfort in her lower back, elbows, knees and SI joints.  She gives history of recurrent plantar fasciitis.  She has been wearing insoles which are helpful.  She has been doing weight training with a personal trainer for the last 2 months.  She has noticed some improvement in the knee discomfort.  She has been also doing some stretching exercises walking and treadmill.  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  She enjoys reading and movies.  She is gravida 2, para 2, miscarriages 0.  There is no history of preeclampsia or DVTs.  Birth control method is IUD.  There is family history of osteoarthritis in maternal grandmother.  Family history of rheumatoid arthritis in mother and sister.  Family history of Sjogren's in her mother.    Activities of Daily Living:  Patient reports morning stiffness for all day. Patient Reports nocturnal pain.  Difficulty dressing/grooming: Denies Difficulty  climbing stairs: Reports Difficulty getting out of chair: Reports Difficulty using hands for taps, buttons, cutlery, and/or writing: Reports  Review of Systems  Constitutional:  Positive for fatigue.  HENT:  Negative for mouth sores, mouth dryness and nose dryness.   Eyes:  Positive for dryness. Negative for pain.  Respiratory:  Negative for shortness of breath and difficulty breathing.   Cardiovascular:  Negative for chest pain and palpitations.  Gastrointestinal:  Negative for blood in stool, constipation and diarrhea.  Endocrine: Negative for increased urination.  Genitourinary:  Negative for involuntary urination.  Musculoskeletal:  Positive for joint pain, joint pain, joint swelling and morning stiffness. Negative for myalgias, muscle weakness, muscle tenderness and myalgias.  Skin:  Negative for color change, rash, hair loss and sensitivity to sunlight.  Allergic/Immunologic: Positive for susceptible to infections.  Neurological:  Negative for dizziness and headaches.  Hematological:  Negative for swollen glands.  Psychiatric/Behavioral:  Positive for depressed mood and sleep disturbance. The patient is nervous/anxious.     PMFS History:  Patient Active Problem List   Diagnosis Date Noted   Elevated LFTs 08/16/2022   Encounter for removal of intrauterine contraceptive device (IUD) 04/27/2022   Foot pain 02/15/2022   Joint pain in both hands 01/29/2021   Family history of coronary artery disease in father 07/30/2019   Contrast media allergy 07/30/2019   Insomnia 06/15/2018   Hyperlipidemia 04/06/2018  Encounter for annual general medical examination with abnormal findings in adult 12/28/2016   Asthma, chronic 05/28/2014   Essential hypertension 05/28/2014   ADJ DISORDER WITH MIXED ANXIETY & DEPRESSED MOOD 07/15/2009   Allergic rhinitis 07/06/2009    Past Medical History:  Diagnosis Date   Allergic rhinitis due to pollen    Anxiety    Chronic cystitis    COVID-19 virus  infection 05/12/2021   Depression    Dyspareunia in female    Dyspnea on exertion    per pt hard to recover after exercise (cardiologist has echo and cardiac CT ordered when schedule is opened up)   Elevated LFTs 08/16/2022   Essential hypertension    cardioloigst-  dr t. raford-- FH premature CAD--- 08-18-2014 normal stress echo   Foot pain 02/15/2022   History of chronic bronchitis    History of HPV infection    remote   History of kidney stones    History of recurrent UTIs    Pure hypercholesterolemia 09/20/2018   Renal calculus, left    per pt non-obstructive    Family History  Problem Relation Age of Onset   Hypertension Mother    Hyperlipidemia Mother    Rheum arthritis Mother    Sjogren's syndrome Mother    Hypertension Father    Hyperlipidemia Father    Coronary artery disease Father        MI 42s, smoked, CABG, MULTIPLE STENTS   Kidney failure Father    Bipolar disorder Sister    Alcohol abuse Sister    Depression Sister    Rheum arthritis Sister    Obesity Sister    Kidney Stones Sister    Parkinson's disease Maternal Grandmother    Colon cancer Maternal Grandfather    Alzheimer's disease Paternal Grandmother    Tics Son    Anxiety disorder Son    ADD / ADHD Son    Anxiety disorder Son    Asthma Son    Liver disease Neg Hx    Esophageal cancer Neg Hx    Past Surgical History:  Procedure Laterality Date   CESAREAN SECTION  2004, 2005   CYSTOSCOPY N/A 11/06/2018   Procedure: CYSTOSCOPY;  Surgeon: Cleotilde Ronal RAMAN, MD;  Location: [facility] [place] [place];  Service: Gynecology;  Laterality: N/A;   HYSTEROSCOPY WITH D & C N/A 04/27/2022   Procedure: DILATATION AND CURETTAGE /HYSTEROSCOPY/MYOSURE POLYP RESECTION;  Surgeon: Cleotilde Ronal RAMAN, MD;  Location: [facility];  Service: Gynecology;  Laterality: N/A;   INTRAUTERINE DEVICE (IUD) INSERTION N/A 11/06/2018   Procedure: INTRAUTERINE DEVICE (IUD) INSERTION WITH ULTRASOUND GUIDANCE;  Surgeon:  Cleotilde Ronal RAMAN, MD;  Location: [facility];  Service: Gynecology;  Laterality: N/A;   INTRAUTERINE DEVICE (IUD) INSERTION N/A 04/27/2022   Procedure: INTRAUTERINE DEVICE (IUD) INSERTION;  Surgeon: Cleotilde Ronal RAMAN, MD;  Location: [facility];  Service: Gynecology;  Laterality: N/A;   IUD REMOVAL N/A 11/06/2018   Procedure: INTRAUTERINE DEVICE (IUD) REMOVAL;  Surgeon: Cleotilde Ronal RAMAN, MD;  Location: [facility];  Service: Gynecology;  Laterality: N/A;  Mirena  IUD removal and reinsertion with ultrasound guidance.   IUD REMOVAL N/A 04/27/2022   Procedure: INTRAUTERINE DEVICE (IUD) REMOVAL;  Surgeon: Cleotilde Ronal RAMAN, MD;  Location: [facility];  Service: Gynecology;  Laterality: N/A;   KNEE ARTHROSCOPY Right 1995   with left knee realignment   KNEE SURGERY Left 1991   NASAL SINUS SURGERY  2015   wtih septoplasty  SEPTOPLASTY     TONSILLECTOMY  24   Social History   Social History Narrative   From [place]  originally   [employer], [employer]   Married to [employer]   2 children   Immunization History  Administered Date(s) Administered   Influenza Whole 03/16/2007   Influenza, High Dose Seasonal PF 03/12/2018, 03/25/2019, 05/10/2019   Influenza, Seasonal, Injecte, Preservative Fre 02/08/2023   Influenza,inj,Quad PF,6+ Mos 03/11/2018, 01/22/2019, 03/11/2021   Influenza-Unspecified 01/22/2019, 03/03/2020, 01/19/2022   Moderna Sars-Covid-2 Vaccination 08/02/2019, 08/30/2019, 04/20/2020   PFIZER(Purple Top)SARS-COV-2 Vaccination 03/11/2021   Pfizer Covid-19 Vaccine Bivalent Booster 64yrs & up 02/25/2022   Tdap 11/27/2017     Objective: Vital Signs: BP (!) 133/90 (BP Location: Right Arm, Patient Position: Sitting, Cuff Size: Normal)   Pulse 80   Resp 16   Ht 5' 6 (1.676 m)   Wt 156 lb (70.8 kg)   BMI 25.18 kg/m    Physical Exam Vitals and nursing note reviewed.  Constitutional:      Appearance: She is  well-developed.  HENT:     Head: Normocephalic and atraumatic.  Eyes:     Conjunctiva/sclera: Conjunctivae normal.  Cardiovascular:     Rate and Rhythm: Normal rate and regular rhythm.     Heart sounds: Normal heart sounds.  Pulmonary:     Effort: Pulmonary effort is normal.     Breath sounds: Normal breath sounds.  Abdominal:     General: Bowel sounds are normal.     Palpations: Abdomen is soft.  Musculoskeletal:     Cervical back: Normal range of motion.  Lymphadenopathy:     Cervical: No cervical adenopathy.  Skin:    General: Skin is warm and dry.     Capillary Refill: Capillary refill takes less than 2 seconds.  Neurological:     Mental Status: She is alert and oriented to person, place, and time.  Psychiatric:        Behavior: Behavior normal.      Musculoskeletal Exam: Cervical, thoracic and lumbar spine were in good range of motion.  No SI joint tenderness was noted.  Shoulder joints, elbow joints, wrist joints in good range of motion.  She had bilateral CMC PIP and DIP thickening with subluxation of her right index finger DIP joint.  No synovitis was noted over MCPs or wrist joints.  Hip joints and knee joints were in good range of motion.  No warmth swelling or effusion was noted in the knee joints.  There was no tenderness over ankles or MTPs.  She had tenderness over bilateral plantar fascia.  CDAI Exam: CDAI Score: -- Patient Global: --; Provider Global: -- Swollen: --; Tender: -- Joint Exam 07/06/2023   No joint exam has been documented for this visit   There is currently no information documented on the homunculus. Go to the Rheumatology activity and complete the homunculus joint exam.  Investigation: No additional findings.  Imaging: No results found.  Recent Labs: Lab Results  Component Value Date   WBC 8.6 08/31/2022   HGB 13.7 08/31/2022   PLT 299.0 08/31/2022   NA 141 02/08/2023   K 3.4 (L) 02/08/2023   CL 101 02/08/2023   CO2 33 (H)  02/08/2023   GLUCOSE 88 02/08/2023   BUN 15 02/08/2023   CREATININE 0.96 02/08/2023   BILITOT 0.7 02/08/2023   ALKPHOS 68 02/08/2023   AST 21 02/08/2023   ALT 28 02/08/2023   PROT 6.6 02/08/2023   ALBUMIN 4.2 02/08/2023  CALCIUM  9.2 02/08/2023   GFRAA 94 11/06/2019   February 08, 2023 triglycerides 157, uric acid 5.0, CRP<1.0, ESR 8, RF negative, anti-CCP negative  Speciality Comments: No specialty comments available.  Procedures:  No procedures performed Allergies: Penicillins, Clindamycin, Codeine, Contrast media [iodinated contrast media], Lisinopril , Doxycycline , Erythromycin, Iohexol , and Neomycin   Assessment / Plan:     Visit Diagnoses: Joint pain in both hands -patient complains of pain and discomfort in the bilateral hands.  No synovitis was noted.  Bilateral CMC PIP and DIP thickening was noted.  She has a strong family history of rheumatoid arthritis in her mother and sister.  Clinical findings were suggestive of osteoarthritis.  Detailed counsel regarding osteoarthritis was provided.  A handout on hand exercises was given.  I will also refer her to physical therapy.  RF negative, anti-CCP negative, sed rate normal, C-reactive protein normal, uric acid normal.  Lab results were reviewed with the patient.- Plan: XR Hand 2 View Right, XR Hand 2 View Left.  X-rays of bilateral hands were suggestive of osteoarthritis.  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  When I called patient regarding the x-ray result patient recall that she jammed her finger in December her finger was very much swollen which is gradually getting better.  Closed nondisplaced fracture of distal phalanx of left index finger, initial encounter -x-ray findings as described.  I discussed the findings with the patient.  Patient was advised to use a finger splint.  Chronic pain of both knees -she complains of discomfort in the bilateral knee joints.  No warmth swelling or effusion  was noted.  She has been going to a systems analyst and has noticed improvement in her knee joint discomfort.  Plan: XR KNEE 3 VIEW RIGHT, XR KNEE 3 VIEW LEFT.  X-rays of bilateral knee joints were unremarkable except left knee joint showed chondromalacia patella.  Pain in both feet -she complains of discomfort in the bilateral feet.  PIP and DIP thickening was noted.  No synovitis was noted.  Plan: XR Foot 2 Views Right, XR Foot 2 Views Left.  X-ray bilateral feet were suggestive of osteoarthritis.  Plantar fasciitis, bilateral she-she had tenderness over bilateral plantar fascia.  A handout on plantar fascia exercises was given.  I will also refer her to physical therapy.  Chronic SI joint pain-she gives history of intermittent discomfort in the SI joints.  She had no tenderness over the SI joints today.  She good mobility in the lumbar spine.  There is no personal or family history of psoriasis.  Other medical problems are listed as follows:  Mixed hyperlipidemia  Essential hypertension-blood pressure was elevated at 133/90.  She was advised to monitor blood pressure closely and follow-up with the PCP.  Mild intermittent chronic asthma without complication  Seasonal allergic rhinitis due to other allergic trigger  Contrast media allergy  Other insomnia  Anxiety and depression  Family history of coronary artery disease in father  Family history of rheumatoid arthritis-mother and sister - Her mother also has Sjogren's.  Family history of osteoarthritis-maternal grandmother  Orders: Orders Placed This Encounter  Procedures   XR Hand 2 View Right   XR Hand 2 View Left   XR KNEE 3 VIEW RIGHT   XR KNEE 3 VIEW LEFT   XR Foot 2 Views Right   XR Foot 2 Views Left   Ambulatory referral to Physical Therapy   No orders of the defined types were placed in this  encounter.    Follow-Up Instructions: Return for Pain in multiple joints.   Maya Nash, MD  Note - This record  has been created using Animal nutritionist.  Chart creation errors have been sought, but may not always  have been located. Such creation errors do not reflect on  the standard of medical care.

## 2023-07-06 ENCOUNTER — Ambulatory Visit: Payer: 59

## 2023-07-06 ENCOUNTER — Ambulatory Visit: Payer: 59 | Attending: Rheumatology | Admitting: Rheumatology

## 2023-07-06 ENCOUNTER — Encounter: Payer: Self-pay | Admitting: Rheumatology

## 2023-07-06 VITALS — BP 133/90 | HR 80 | Resp 16 | Ht 66.0 in | Wt 156.0 lb

## 2023-07-06 DIAGNOSIS — I10 Essential (primary) hypertension: Secondary | ICD-10-CM

## 2023-07-06 DIAGNOSIS — G8929 Other chronic pain: Secondary | ICD-10-CM

## 2023-07-06 DIAGNOSIS — M25561 Pain in right knee: Secondary | ICD-10-CM

## 2023-07-06 DIAGNOSIS — M533 Sacrococcygeal disorders, not elsewhere classified: Secondary | ICD-10-CM

## 2023-07-06 DIAGNOSIS — M25541 Pain in joints of right hand: Secondary | ICD-10-CM

## 2023-07-06 DIAGNOSIS — M79671 Pain in right foot: Secondary | ICD-10-CM

## 2023-07-06 DIAGNOSIS — F419 Anxiety disorder, unspecified: Secondary | ICD-10-CM

## 2023-07-06 DIAGNOSIS — M79672 Pain in left foot: Secondary | ICD-10-CM

## 2023-07-06 DIAGNOSIS — Z8269 Family history of other diseases of the musculoskeletal system and connective tissue: Secondary | ICD-10-CM

## 2023-07-06 DIAGNOSIS — J4520 Mild intermittent asthma, uncomplicated: Secondary | ICD-10-CM

## 2023-07-06 DIAGNOSIS — Z8261 Family history of arthritis: Secondary | ICD-10-CM

## 2023-07-06 DIAGNOSIS — M25562 Pain in left knee: Secondary | ICD-10-CM | POA: Diagnosis not present

## 2023-07-06 DIAGNOSIS — M25542 Pain in joints of left hand: Secondary | ICD-10-CM | POA: Diagnosis not present

## 2023-07-06 DIAGNOSIS — G4709 Other insomnia: Secondary | ICD-10-CM

## 2023-07-06 DIAGNOSIS — F32A Depression, unspecified: Secondary | ICD-10-CM

## 2023-07-06 DIAGNOSIS — M722 Plantar fascial fibromatosis: Secondary | ICD-10-CM

## 2023-07-06 DIAGNOSIS — Z8249 Family history of ischemic heart disease and other diseases of the circulatory system: Secondary | ICD-10-CM

## 2023-07-06 DIAGNOSIS — Z91041 Radiographic dye allergy status: Secondary | ICD-10-CM

## 2023-07-06 DIAGNOSIS — S62661A Nondisplaced fracture of distal phalanx of left index finger, initial encounter for closed fracture: Secondary | ICD-10-CM

## 2023-07-06 DIAGNOSIS — E782 Mixed hyperlipidemia: Secondary | ICD-10-CM

## 2023-07-06 DIAGNOSIS — J3089 Other allergic rhinitis: Secondary | ICD-10-CM

## 2023-07-06 NOTE — Patient Instructions (Addendum)
 Hand Exercises Hand exercises can be helpful for almost anyone. They can strengthen your hands and improve flexibility and movement. The exercises can also increase blood flow to the hands. These results can make your work and daily tasks easier for you. Hand exercises can be especially helpful for people who have joint pain from arthritis or nerve damage from using their hands over and over. These exercises can also help people who injure a hand. Exercises Most of these hand exercises are gentle stretching and motion exercises. It is usually safe to do them often throughout the day. Warming up your hands before exercise may help reduce stiffness. You can do this with gentle massage or by placing your hands in warm water for 10-15 minutes. It is normal to feel some stretching, pulling, tightness, or mild discomfort when you begin new exercises. In time, this will improve. Remember to always be careful and stop right away if you feel sudden, very bad pain or your pain gets worse. You want to get better and be safe. Ask your health care provider which exercises are safe for you. Do exercises exactly as told by your provider and adjust them as told. Do not begin these exercises until told by your provider. Knuckle bend or claw fist  Stand or sit with your arm, hand, and all five fingers pointed straight up. Make sure to keep your wrist straight. Gently bend your fingers down toward your palm until the tips of your fingers are touching your palm. Keep your big knuckle straight and only bend the small knuckles in your fingers. Hold this position for 10 seconds. Straighten your fingers back to your starting position. Repeat this exercise 5-10 times with each hand. Full finger fist  Stand or sit with your arm, hand, and all five fingers pointed straight up. Make sure to keep your wrist straight. Gently bend your fingers into your palm until the tips of your fingers are touching the middle of your  palm. Hold this position for 10 seconds. Extend your fingers back to your starting position, stretching every joint fully. Repeat this exercise 5-10 times with each hand. Straight fist  Stand or sit with your arm, hand, and all five fingers pointed straight up. Make sure to keep your wrist straight. Gently bend your fingers at the big knuckle, where your fingers meet your hand, and at the middle knuckle. Keep the knuckle at the tips of your fingers straight and try to touch the bottom of your palm. Hold this position for 10 seconds. Extend your fingers back to your starting position, stretching every joint fully. Repeat this exercise 5-10 times with each hand. Tabletop  Stand or sit with your arm, hand, and all five fingers pointed straight up. Make sure to keep your wrist straight. Gently bend your fingers at the big knuckle, where your fingers meet your hand, as far down as you can. Keep the small knuckles in your fingers straight. Think of forming a tabletop with your fingers. Hold this position for 10 seconds. Extend your fingers back to your starting position, stretching every joint fully. Repeat this exercise 5-10 times with each hand. Finger spread  Place your hand flat on a table with your palm facing down. Make sure your wrist stays straight. Spread your fingers and thumb apart from each other as far as you can until you feel a gentle stretch. Hold this position for 10 seconds. Bring your fingers and thumb tight together again. Hold this position for 10 seconds. Repeat  this exercise 5-10 times with each hand. Making circles  Stand or sit with your arm, hand, and all five fingers pointed straight up. Make sure to keep your wrist straight. Make a circle by touching the tip of your thumb to the tip of your index finger. Hold for 10 seconds. Then open your hand wide. Repeat this motion with your thumb and each of your fingers. Repeat this exercise 5-10 times with each hand. Thumb  motion  Sit with your forearm resting on a table and your wrist straight. Your thumb should be facing up toward the ceiling. Keep your fingers relaxed as you move your thumb. Lift your thumb up as high as you can toward the ceiling. Hold for 10 seconds. Bend your thumb across your palm as far as you can, reaching the tip of your thumb for the small finger (pinkie) side of your palm. Hold for 10 seconds. Repeat this exercise 5-10 times with each hand. Grip strengthening  Hold a stress ball or other soft ball in the middle of your hand. Slowly increase the pressure, squeezing the ball as much as you can without causing pain. Think of bringing the tips of your fingers into the middle of your palm. All of your finger joints should bend when doing this exercise. Hold your squeeze for 10 seconds, then relax. Repeat this exercise 5-10 times with each hand. Contact a health care provider if: Your hand pain or discomfort gets much worse when you do an exercise. Your hand pain or discomfort does not improve within 2 hours after you exercise. If you have either of these problems, stop doing these exercises right away. Do not do them again unless your provider says that you can. Get help right away if: You develop sudden, severe hand pain or swelling. If this happens, stop doing these exercises right away. Do not do them again unless your provider says that you can. This information is not intended to replace advice given to you by your health care provider. Make sure you discuss any questions you have with your health care provider. Document Revised: 05/31/2022 Document Reviewed: 05/31/2022 [employer] Patient Education  2024 [employer]  Exercises for Plantar Fasciitis Foot and leg exercises can help if you have plantar fasciitis. Only do the exercises you were told to do. Make sure you know how to do the exercises safely. Follow the steps below. It's normal to feel mild discomfort. Stop if you feel  pain or your pain gets worse. Do not start these exercises until told by your health care provider. Stretching and range-of-motion exercises These exercises warm up your muscles and joints. They also help with movement and flexibility of your foot. They can help with pain. Plantar fascia stretch This exercise will stretch your plantar fascia, which is a band of thick tissue on the bottom of your foot. Sit with your left / right leg crossed over your other knee. Hold your heel with one hand with that thumb near your arch. With your other hand, hold your toes. Gently pull your toes back toward the top of your foot. You should feel a stretch on the bottom of your toes, on the bottom of your foot, or both. Hold this stretch for __________ seconds. Slowly let go of your toes. Go back to the starting position. Repeat __________ times. Do this exercise __________ times a day. Gastroc stretch, standing This exercise is called an upper calf, or gastroc, stretch. It stretches the muscles in the back of your  upper calf. Stand with your hands against a wall. Extend your left / right leg behind you. Bend your front knee just a little. Keep your heels on the floor, your toes facing forward, and your back knee straight. Shift your weight toward the wall. Do not arch your back. You should feel a gentle stretch in your upper calf. Hold this position for __________ seconds. Repeat __________ times. Do this exercise __________ times a day. Soleus stretch, standing This exercise is called a lower calf, or soleus, stretch. It stretches the muscles in the back of your lower calf. Stand with your hands against a wall. Extend your left / right leg behind you, and bend your front knee slightly. Keep your heels on the floor and your toes facing forward. Bend your back knee and shift your weight slightly over your back leg. You should feel a gentle stretch deep in your lower calf. Hold this position for __________  seconds. Repeat __________ times. Do this exercise __________ times a day. Gastroc and soleus stretch, standing step This exercise stretches the muscles in the back of your lower leg. This includes your gastroc and soleus muscles. Stand with the ball of your left / right foot on the front of a step. The ball of your foot is on the walking surface, right under your toes. Keep your other foot firmly on the same step. Hold on to the wall or a railing for balance. Slowly lift your other foot, letting your body weight press your heel down over the edge of the front of the step. Keep your knee straight and unbent. You should feel a stretch in your calf. Hold this position for __________ seconds. Return both feet to the step. Repeat this exercise with a slight bend in your left / right knee. Repeat __________ times with your left / right knee straight and __________ times with your left / right knee bent. Do this exercise __________ times a day. Balance exercise This exercise builds your balance and strength control of your arch. It helps take pressure off your plantar fascia. Single leg stand If this exercise is too easy, you can try it with your eyes closed or while standing on a pillow. Without shoes, stand near a railing or in a doorway. You may hold on to the railing or doorway as needed. Stand on your left / right foot. Keep your big toe down on the floor. Lift the arch of your foot. You should feel a stretch across the bottom of your foot and arch. Do not let your foot roll inward. Hold this position for __________ seconds. Repeat __________ times. Do this exercise __________ times a day. This information is not intended to replace advice given to you by your health care provider. Make sure you discuss any questions you have with your health care provider. Document Revised: 10/17/2022 Document Reviewed: 10/17/2022 [employer] Patient Education  2024 [employer]  Osteoarthritis  Osteoarthritis is a type of arthritis. It refers to joint pain or joint disease. Osteoarthritis affects tissue that covers the ends of bones in joints (cartilage). Cartilage acts as a cushion between the bones and helps them move smoothly. Osteoarthritis occurs when cartilage in the joints gets worn down. Osteoarthritis is sometimes called wear and tear arthritis. Osteoarthritis is the most common form of arthritis. It often occurs in older people. It is a condition that gets worse over time. The joints most often affected by this condition are in the fingers, toes, hips, knees, and spine,  including the neck and lower back. What are the causes? This condition is caused by the wearing down of cartilage that covers the ends of bones. What increases the risk? The following factors may make you more likely to develop this condition: Being age 17 or older. Obesity. Overuse of joints. Past injury of a joint. Past surgery on a joint. Family history of osteoarthritis. What are the signs or symptoms? The main symptoms of this condition are pain, swelling, and stiffness in the joint. Other symptoms may include: An enlarged joint. More pain and further damage caused by small pieces of bone or cartilage that break off and float inside of the joint. Small deposits of bone (osteophytes) that grow on the edges of the joint. A grating or scraping feeling inside the joint when you move it. Popping or creaking sounds when you move. Difficulty walking or exercising. An inability to grip items, twist your hand, or control the movements of your hands and fingers. How is this diagnosed? This condition may be diagnosed based on: Your medical history. A physical exam. Your symptoms. X-rays of the affected joints. Blood tests to rule out other types of arthritis. How is this treated? There is no cure for this condition, but treatment can help control pain and improve joint function.  Treatment may include a combination of therapies, such as: Pain relief techniques, such as: Applying heat and cold to the joint. Massage. A form of talk therapy called cognitive behavioral therapy (CBT). This therapy helps you set goals and follow up on the changes that you make. Medicines for pain and inflammation. The medicines can be taken by mouth or applied to the skin. They include: NSAIDs, such as ibuprofen . Prescription medicines. Strong anti-inflammatory medicines (corticosteroids). Certain nutritional supplements. A prescribed exercise program. You may work with a physical therapist. Assistive devices, such as a brace, wrap, splint, specialized glove, or cane. A weight control plan. Surgery, such as: An osteotomy. This is done to reposition the bones and relieve pain or to remove loose pieces of bone and cartilage. Joint replacement surgery. You may need this surgery if you have advanced osteoarthritis. Follow these instructions at home: Activity Rest your affected joints as told by your health care provider. Exercise as told by your provider. The provider may recommend specific types of exercise, such as: Strengthening exercises. These are done to strengthen the muscles that support joints affected by arthritis. Aerobic activities. These are exercises, such as brisk walking or water aerobics, that increase your heart rate. Range-of-motion activities. These help your joints move more easily. Balance and agility exercises. Managing pain, stiffness, and swelling     If told, apply heat to the affected area as often as told by your provider. Use the heat source that your provider recommends, such as a moist heat pack or a heating pad. If you have a removable assistive device, remove it as told by your provider. Place a towel between your skin and the heat source. If your provider tells you to keep the assistive device on while you apply heat, place a towel between the assistive  device and the heat source. Leave the heat on for 20-30 minutes. If told, put ice on the affected area. If you have a removable assistive device, remove it as told by your provider. Put ice in a plastic bag. Place a towel between your skin and the bag. If your provider tells you to keep the assistive device on during icing, place a towel between the assistive device  and the bag. Leave the ice on for 20 minutes, 2-3 times a day. If your skin turns bright red, remove the ice or heat right away to prevent skin damage. The risk of damage is higher if you cannot feel pain, heat, or cold. Move your fingers or toes often to reduce stiffness and swelling. Raise (elevate) the affected area above the level of your heart while you are sitting or lying down. General instructions Take over-the-counter and prescription medicines only as told by your provider. Maintain a healthy weight. Follow instructions from your provider for weight control. Do not use any products that contain nicotine or tobacco. These products include cigarettes, chewing tobacco, and vaping devices, such as e-cigarettes. If you need help quitting, ask your provider. Use assistive devices as told by your provider. Where to find more information [employer]thritis and Musculoskeletal and Skin Diseases: niams.http://www.myers.net/ [employer] on Aging: baseringtones.pl American [employer] of Rheumatology: rheumatology.org Contact a health care provider if: You have redness, swelling, or a feeling of warmth in a joint that gets worse. You have a fever along with joint or muscle aches. You develop a rash. You have trouble doing your normal activities. You have pain that gets worse and is not relieved by pain medicine. This information is not intended to replace advice given to you by your health care provider. Make sure you discuss any questions you have with your health care provider. Document Revised: 01/13/2022 Document Reviewed:  01/13/2022 [employer] Patient Education  2024 [employer].

## 2023-07-11 ENCOUNTER — Other Ambulatory Visit: Payer: Self-pay | Admitting: *Deleted

## 2023-07-11 ENCOUNTER — Ambulatory Visit (INDEPENDENT_AMBULATORY_CARE_PROVIDER_SITE_OTHER): Payer: 59 | Admitting: Physical Therapy

## 2023-07-11 ENCOUNTER — Encounter: Payer: Self-pay | Admitting: Rehabilitative and Restorative Service Providers"

## 2023-07-11 ENCOUNTER — Ambulatory Visit (INDEPENDENT_AMBULATORY_CARE_PROVIDER_SITE_OTHER): Payer: 59 | Admitting: Rehabilitative and Restorative Service Providers"

## 2023-07-11 ENCOUNTER — Telehealth: Payer: Self-pay | Admitting: Rheumatology

## 2023-07-11 ENCOUNTER — Other Ambulatory Visit: Payer: Self-pay | Admitting: Obstetrics & Gynecology

## 2023-07-11 ENCOUNTER — Encounter: Payer: Self-pay | Admitting: Physical Therapy

## 2023-07-11 DIAGNOSIS — M79642 Pain in left hand: Secondary | ICD-10-CM

## 2023-07-11 DIAGNOSIS — R29898 Other symptoms and signs involving the musculoskeletal system: Secondary | ICD-10-CM

## 2023-07-11 DIAGNOSIS — M79641 Pain in right hand: Secondary | ICD-10-CM

## 2023-07-11 DIAGNOSIS — M6281 Muscle weakness (generalized): Secondary | ICD-10-CM | POA: Diagnosis not present

## 2023-07-11 DIAGNOSIS — M25572 Pain in left ankle and joints of left foot: Secondary | ICD-10-CM

## 2023-07-11 DIAGNOSIS — M25571 Pain in right ankle and joints of right foot: Secondary | ICD-10-CM

## 2023-07-11 DIAGNOSIS — M25541 Pain in joints of right hand: Secondary | ICD-10-CM

## 2023-07-11 DIAGNOSIS — M25642 Stiffness of left hand, not elsewhere classified: Secondary | ICD-10-CM

## 2023-07-11 DIAGNOSIS — M25641 Stiffness of right hand, not elsewhere classified: Secondary | ICD-10-CM

## 2023-07-11 DIAGNOSIS — R600 Localized edema: Secondary | ICD-10-CM | POA: Diagnosis not present

## 2023-07-11 DIAGNOSIS — Z1231 Encounter for screening mammogram for malignant neoplasm of breast: Secondary | ICD-10-CM

## 2023-07-11 NOTE — Therapy (Signed)
OUTPATIENT OCCUPATIONAL THERAPY ORTHO EVALUATION  Patient Name: Shelley Figueroa MRN: [id number] DOB:Aug 09, 1975, 48 y.o., female Today's Date: 07/11/2023  PCP: Allayne Gitelman NP  REFERRING PROVIDER: Pollyann Savoy, MD   END OF SESSION:  OT End of Session - 07/11/23 1431     Visit Number 1    Number of Visits 8    Date for OT Re-Evaluation 09/08/23    Authorization Type Aet[employer]    OT Start Time 1431    OT Stop Time 1523    OT Time Calculation (min) 52 min    Equipment Utilized During Treatment orthotic materials    Activity Tolerance Patient tolerated treatment well;No increased pain;Patient limited by fatigue;Patient limited by pain    Behavior During Therapy [facility] for tasks assessed/performed             Past Medical History:  Diagnosis Date   Allergic rhinitis due to pollen    Anxiety    Chronic cystitis    COVID-19 virus infection 05/12/2021   Depression    Dyspareunia in female    Dyspnea on exertion    per pt hard to recover after exercise (cardiologist has echo and cardiac CT ordered when schedule is opened up)   Elevated LFTs 08/16/2022   Essential hypertension    cardioloigst-  dr t. [facility]-- FH premature CAD--- 08-18-2014 normal stress echo   Foot pain 02/15/2022   History of chronic bronchitis    History of HPV infection    remote   History of kidney stones    History of recurrent UTIs    Pure hypercholesterolemia 09/20/2018   Renal calculus, left    per pt non-obstructive   Past Surgical History:  Procedure Laterality Date   CESAREAN SECTION  2004, 2005   CYSTOSCOPY N/A 11/06/2018   Procedure: CYSTOSCOPY;  Surgeon: Jerene Bears, MD;  Location: [facility];  Service: Gynecology;  Laterality: N/A;   HYSTEROSCOPY WITH D & C N/A 04/27/2022   Procedure: DILATATION AND CURETTAGE /HYSTEROSCOPY/MYOSURE POLYP RESECTION;  Surgeon: Jerene Bears, MD;  Location: [facility];  Service: Gynecology;  Laterality: N/A;   INTRAUTERINE  DEVICE (IUD) INSERTION N/A 11/06/2018   Procedure: INTRAUTERINE DEVICE (IUD) INSERTION WITH ULTRASOUND GUIDANCE;  Surgeon: Jerene Bears, MD;  Location: [facility];  Service: Gynecology;  Laterality: N/A;   INTRAUTERINE DEVICE (IUD) INSERTION N/A 04/27/2022   Procedure: INTRAUTERINE DEVICE (IUD) INSERTION;  Surgeon: Jerene Bears, MD;  Location: [facility] [place];  Service: Gynecology;  Laterality: N/A;   IUD REMOVAL N/A 11/06/2018   Procedure: INTRAUTERINE DEVICE (IUD) REMOVAL;  Surgeon: Jerene Bears, MD;  Location: [facility];  Service: Gynecology;  Laterality: N/A;  Mirena IUD removal and reinsertion with ultrasound guidance.   IUD REMOVAL N/A 04/27/2022   Procedure: INTRAUTERINE DEVICE (IUD) REMOVAL;  Surgeon: Jerene Bears, MD;  Location: [facility];  Service: Gynecology;  Laterality: N/A;   KNEE ARTHROSCOPY Right 1995   with left knee realignment   KNEE SURGERY Left 1991   NASAL SINUS SURGERY  2015   wtih septoplasty   SEPTOPLASTY     TONSILLECTOMY  1987   Patient Active Problem List   Diagnosis Date Noted   Elevated LFTs 08/16/2022   Encounter for removal of intrauterine contraceptive device (IUD) 04/27/2022   Foot pain 02/15/2022   Joint pain in both hands 01/29/2021   Family history of coronary artery disease in father 07/30/2019   Contrast media allergy  07/30/2019   Insomnia 06/15/2018   Hyperlipidemia 04/06/2018   Encounter for annual general medical examination with abnormal findings in adult 12/28/2016   Asthma, chronic 05/28/2014   Essential hypertension 05/28/2014   ADJ DISORDER WITH MIXED ANXIETY & DEPRESSED MOOD 07/15/2009   Allergic rhinitis 07/06/2009    ONSET DATE: 1-2 years, DOI 05/12/23  REFERRING DIAG:  M25.541,M25.542 (ICD-10-CM) - Joint pain in both hands    THERAPY DIAG:  Pain in right hand  Pain in left hand  Muscle weakness (generalized)  Localized edema  Stiffness of right hand, not  elsewhere classified  Stiffness of left hand, not elsewhere classified  Rationale for Evaluation and Treatment: Rehabilitation  SUBJECTIVE:   SUBJECTIVE STATEMENT: She states she broke her Lt IF on 05/12/23, and it went untreated and has a nonhealing fracture for which she just started wearing a prefabricated metal anger splint.  She also has chronic bil hand pain R> L, primarily in the base of her thumbs as well as her tip joints of her fingers with some overt Heberden's nodes. She states neg for auto-immune issues.  Also here for PT eval for plantar fascitis.    PERTINENT HISTORY: Finger fracture in 05/12/2023, chronic arthritis, plantar fasciitis  PRECAUTIONS: None  RED FLAGS: None   WEIGHT BEARING RESTRICTIONS: Yes less than 5 pounds in left hand recommended for the next month and nothing painful  PAIN:  Are you having pain? Yes: NPRS scale: constant pain 3-5/10 on average depending on activity.  Pain location: Bilateral thumb CMC joints as well as DIP joints and the left index finger Pain description: Aching Aggravating factors: Repetitive or heavy gripping Relieving factors: Rest and heat  FALLS: Has patient fallen in last 6 months? No  LIVING ENVIRONMENT: Lives with: lives with their family Lives in: House/apartment Has following equipment at home:  Jar opener's and some other small adaptive equipment to help with hand pain  PLOF: Independent  PATIENT GOALS: To improve pain in both of her hands and also adequately heal her finger fracture  NEXT MD VISIT: 08/10/2023    OBJECTIVE: (All objective assessments below are from initial evaluation on: 07/11/23 unless otherwise specified.)   HAND DOMINANCE: Right   ADLs: Overall ADLs: States decreased ability to grab, hold household objects, pain and difficulty to open containers, perform FMS tasks (manipulate fasteners on clothing)   FUNCTIONAL OUTCOME MEASURES: Eval: Quick DASH 27% impairment today  (Higher % Score  =   More Impairment)     UPPER EXTREMITY ROM     Shoulder to Wrist AROM Right eval Left eval  Forearm supination 87 88  Forearm pronation  80 80  Wrist flexion 71 76  Wrist extension 73 77  (Blank rows = not tested)   Hand AROM Right eval Left eval  Full Fist Ability (or Gap to Distal Palmar Crease) Full fist but IF is tight  Unable with left hand due to finger fracture  Thumb Opposition  (Kapandji Scale)  9.5/10 10/10  IF MCP   TBD  IF PIP J   TBD  IF DIP J    TBD  (Blank rows = not tested)   UPPER EXTREMITY MMT:    Eval:  NT at eval due to recent and still healing injuries. Will be tested when appropriate.   MMT Right TBD Left TBD  Shoulder flexion    Shoulder abduction    Shoulder adduction    Shoulder extension    Shoulder internal rotation    Shoulder external rotation  Middle trapezius    Lower trapezius    Elbow flexion    Elbow extension    Forearm supination    Forearm pronation    Wrist flexion    Wrist extension    Wrist ulnar deviation    Wrist radial deviation    (Blank rows = not tested)  HAND FUNCTION: Eval: Observed weakness in affected bil hands.  Grip strength Right: 53.6 lbs tender, Left: NT lbs   COORDINATION: Eval: Observed coordination impairments with affected bil hands. TBD baseline measures.  9 Hole Peg Test Right: TBDsec, Left: TBD sec (TBD sec is WFL)   SENSATION: Eval:  Light touch intact today   EDEMA:   Eval:  Mildly swollen in bilateral hands DIP joints and especially left index finger DIP joint due to fracture  COGNITION: Eval: Overall cognitive status: WFL for evaluation today   OBSERVATIONS:   Eval: Some overt Heberden's nodes, tenderness about the CMC joints of the thumb, some overt stiffness especially with broken left index finger   TODAY'S TREATMENT:  Post-evaluation treatment:   Custom orthotic fabrication was indicated due to pt's broken left hand index finger distal phalanx shaft fracture and need for  safe, functional positioning. OT fabricated custom DIP joint immobilization orthosis for pt today to protect the healing fracture. It fit well with no areas of pressure, pt states a comfortable fit. Pt was educated on the wearing schedule (on at all times for likely 4 weeks), to call or come in ASAP if it is causing any irritation or is not achieving desired function. It will be checked/adjusted in upcoming sessions, as needed. Pt states understanding all directions.     Additionally she is here to help her manage her arthritis in both hands, and she was given safety/self-care education on this including modifying tasks, using protective padding or gloves, and performing a home exercise program to loosen the joints and maintain supple dexterity.  These exercises are listed below and each was quickly shown to her today but will likely need review next week.  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  Exercises - HOOK Stretch  - 4 x daily - 3-5 reps - 15-20 sec hold - Seated Wrist Flexion Stretch  - 3 x daily - 3 reps - 15 hold - Wrist Prayer Stretch  - 3 x daily - 3 reps - 15 sec hold - Stretch Thumb DOWNWARD  - 3 x daily - 3 reps - 15 sec hold - Thumb Webspace Stretch  - 3 x daily - 3 reps - 15 sec hold - Towel Roll Grip with Forearm in Neutral  - 3 x daily - 5 reps - 10 sec hold   PATIENT EDUCATION: Education details: See tx section above for details  Person educated: Patient Education method: Verbal Instruction, Teach back, Handouts  Education comprehension: States and demonstrates understanding, Additional Education required    HOME EXERCISE PROGRAM: Access Code: [email or web address] URL: [email or web address] Date: 07/11/2023 Prepared by: Fannie Knee   GOALS: Goals reviewed with patient? Yes   SHORT TERM GOALS: (STG required if POC>30 days) Target  Date: 07/28/2023  Pt will obtain protective, custom orthotic. Goal status: 07/11/2023 MET   2.  Pt will demo/state understanding of initial HEP to improve pain levels and prerequisite motion. Goal status: INITIAL   LONG TERM GOALS: Target Date: 09/08/2023  Pt will improve functional  ability by decreased impairment per Quick DASH assessment from 27% to 15% or better, for better quality of life. Goal status: INITIAL  2.  Pt will improve grip strength in right hand from tender 53 lbs to at least nontender 56 lbs for functional use at home and in IADLs. Goal status: INITIAL  3.  Pt will improve A/ROM in right hand full fist from loose fist with index finger extended to at least a tight full fist, to have functional motion for tasks like reach and grasp.  Goal status: INITIAL  4.  Pt will improve A/ROM in left hand index finger total active motion from limited range of motion due to broken DIP joint to at least 210 degrees total active motion, to have functional motion for tasks like reach and grasp.  Goal status: INITIAL  5.  Pt will improve coordination skills in bilateral hands, as seen by within functional limit score on nine-hole peg testing to have increased functional ability to carry out fine motor tasks (fasteners, etc.) and more complex, coordinated IADLs (meal prep, sports, etc.).  Goal status: INITIAL  6.  Pt will decrease pain at rest from 3-5/10 to 1-3/10 or better to have better sleep and occupational participation in daily roles. Goal status: INITIAL  ASSESSMENT:  CLINICAL IMPRESSION: Patient is a 47y.o. female who was seen today for occupational therapy evaluation for fractured left hand index finger as well as bilateral hand arthritis primarily in the [facility] joints as well as the DIP joints.  This causes pain, stiffness, decreased coordination and functional ability per her self-report's, but she will benefit from outpatient occupational therapy to improve her symptoms and  increase hand function and quality of life.   PERFORMANCE DEFICITS: in functional skills including ADLs, IADLs, coordination, dexterity, ROM, strength, pain, fascial restrictions, flexibility, Fine motor control, body mechanics, endurance, decreased knowledge of precautions, and UE functional use, cognitive skills including problem solving and safety awareness, and psychosocial skills including coping strategies, environmental adaptation, and habits.   IMPAIRMENTS: are limiting patient from ADLs, IADLs, work, and leisure.   COMORBIDITIES: may have co-morbidities  that affects occupational performance. Patient will benefit from skilled OT to address above impairments and improve overall function.  MODIFICATION OR ASSISTANCE TO COMPLETE EVALUATION: No modification of tasks or assist necessary to complete an evaluation.  OT OCCUPATIONAL PROFILE AND HISTORY: Detailed assessment: Review of records and additional review of physical, cognitive, psychosocial history related to current functional performance.  CLINICAL DECISION MAKING: Moderate - several treatment options, min-mod task modification necessary  REHAB POTENTIAL: Excellent  EVALUATION COMPLEXITY: Low      PLAN:  OT FREQUENCY: 1-2x/week  OT DURATION: 8 weeks through 09/08/2023 and up to 8 total visits as needed (extended time due to fracture finger and need for at least 1 month of healing)  PLANNED INTERVENTIONS: 54098 OT Re-evaluation, 97535 self care/ADL training, 11914 therapeutic exercise, 97530 therapeutic activity, 97112 neuromuscular re-education, 97140 manual therapy, 97035 ultrasound, 97039 fluidotherapy, 97010 moist heat, 97010 cryotherapy, 97760 Orthotics management and training, 78295 Splinting (initial encounter), [id number] Subsequent splinting/medication, Dry needling, energy conservation, coping strategies training, patient/family education, and DME and/or AE instructions  RECOMMENDED OTHER SERVICES: None now  CONSULTED  AND AGREED WITH PLAN OF CARE: Patient  PLAN FOR NEXT SESSION:   Check orthosis immobilizing the DIP joint of the left hand index finger, check on initial recommendations and home exercise program.    Fannie Knee, OTR/L, CHT 07/11/2023, 4:37 PM

## 2023-07-11 NOTE — Telephone Encounter (Signed)
Keshia from [facility] called [phone]) stating they would need another referral that is separate for OT for pts hands.

## 2023-07-11 NOTE — Therapy (Signed)
OUTPATIENT PHYSICAL THERAPY EVALUATION   Patient Name: Shelley Figueroa MRN: [id number] DOB:08-May-1976, 48 y.o., female Today's Date: 07/11/2023  END OF SESSION:  PT End of Session - 07/11/23 1409     Visit Number 1    Number of Visits 6    Date for PT Re-Evaluation 08/22/23    Authorization Type [employer]et[employer] 10% coinsurance    PT Start Time 1305    PT Stop Time 1342    PT Time Calculation (min) 37 min    Activity Tolerance Patient tolerated treatment well    Behavior During Therapy [facility] for tasks assessed/performed             Past Medical History:  Diagnosis Date   Allergic rhinitis due to pollen    Anxiety    Chronic cystitis    COVID-19 virus infection 05/12/2021   Depression    Dyspareunia in female    Dyspnea on exertion    per pt hard to recover after exercise (cardiologist has echo and cardiac CT ordered when schedule is opened up)   Elevated LFTs 08/16/2022   Essential hypertension    cardioloigst-  dr t. [facility]-- FH premature CAD--- 08-18-2014 normal stress echo   Foot pain 02/15/2022   History of chronic bronchitis    History of HPV infection    remote   History of kidney stones    History of recurrent UTIs    Pure hypercholesterolemia 09/20/2018   Renal calculus, left    per pt non-obstructive   Past Surgical History:  Procedure Laterality Date   CESAREAN SECTION  2004, 2005   CYSTOSCOPY N/A 11/06/2018   Procedure: CYSTOSCOPY;  Surgeon: Jerene Bears, MD;  Location: [facility];  Service: Gynecology;  Laterality: N/A;   HYSTEROSCOPY WITH D & C N/A 04/27/2022   Procedure: DILATATION AND CURETTAGE /HYSTEROSCOPY/MYOSURE POLYP RESECTION;  Surgeon: Jerene Bears, MD;  Location: [facility];  Service: Gynecology;  Laterality: N/A;   INTRAUTERINE DEVICE (IUD) INSERTION N/A 11/06/2018   Procedure: INTRAUTERINE DEVICE (IUD) INSERTION WITH ULTRASOUND GUIDANCE;  Surgeon: Jerene Bears, MD;  Location: [facility];   Service: Gynecology;  Laterality: N/A;   INTRAUTERINE DEVICE (IUD) INSERTION N/A 04/27/2022   Procedure: INTRAUTERINE DEVICE (IUD) INSERTION;  Surgeon: Jerene Bears, MD;  Location: [facility];  Service: Gynecology;  Laterality: N/A;   IUD REMOVAL N/A 11/06/2018   Procedure: INTRAUTERINE DEVICE (IUD) REMOVAL;  Surgeon: Jerene Bears, MD;  Location: [facility];  Service: Gynecology;  Laterality: N/A;  Mirena IUD removal and reinsertion with ultrasound guidance.   IUD REMOVAL N/A 04/27/2022   Procedure: INTRAUTERINE DEVICE (IUD) REMOVAL;  Surgeon: Jerene Bears, MD;  Location: [facility];  Service: Gynecology;  Laterality: N/A;   KNEE ARTHROSCOPY Right 1995   with left knee realignment   KNEE SURGERY Left 1991   NASAL SINUS SURGERY  2015   wtih septoplasty   SEPTOPLASTY     TONSILLECTOMY  1987   Patient Active Problem List   Diagnosis Date Noted   Elevated LFTs 08/16/2022   Encounter for removal of intrauterine contraceptive device (IUD) 04/27/2022   Foot pain 02/15/2022   Joint pain in both hands 01/29/2021   Family history of coronary artery disease in father 07/30/2019   Contrast media allergy 07/30/2019   Insomnia 06/15/2018   Hyperlipidemia 04/06/2018   Encounter for annual general medical examination with abnormal findings in adult 12/28/2016   Asthma, chronic  05/28/2014   Essential hypertension 05/28/2014   ADJ DISORDER WITH MIXED ANXIETY & DEPRESSED MOOD 07/15/2009   Allergic rhinitis 07/06/2009    PCP: Doreene Nest, NP  REFERRING PROVIDER: Pollyann Savoy, MD  REFERRING DIAG: M72.2 (ICD-10-CM) - Plantar fasciitis, bilateral  Rationale for Evaluation and Treatment: Rehabilitation  THERAPY DIAG:  Pain in joints of both feet - Plan: PT plan of care cert/re-cert  Other symptoms and signs involving the musculoskeletal system - Plan: PT plan of care cert/re-cert  ONSET DATE: 2019   SUBJECTIVE:                                                                                                                                                                                            SUBJECTIVE STATEMENT: Pt with 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  She has custom insoles and has had some injections which have been helpful.  She's tried stretching, and did go through a round of PT which didn't seem to help much.  She has had dry needling to her calves and reports it was very painful.  PERTINENT HISTORY:  HTN, anxiety, depression  PAIN:  Are you having pain? Yes: NPRS scale: 3 currently, up to 7, at best 0/10 Pain location: bil feet (Rt worse than Lt) Pain description: sharp, pressure to the arch of foot Aggravating factors: wearing the wrong shoes, walking barefoot Relieving factors: proper footwear, stretching, rest   PRECAUTIONS:  None  RED FLAGS: None   WEIGHT BEARING RESTRICTIONS:  No  FALLS:  Has patient fallen in last 6 months? No  LIVING ENVIRONMENT: Lives with: lives with their spouse Lives in: House/apartment Stairs: able to live on main level; no difficulty with stairs   OCCUPATION:  Full-time: Psychologist, clinical; works from home   PLOF:  Independent and Leisure: daily walking, personal training sessions, movies, books  PATIENT GOALS:  Improve pain   OBJECTIVE:  Note: Objective measures were completed at Evaluation unless otherwise noted.  DIAGNOSTIC FINDINGS:  Bil feet x-rays: OA  PATIENT SURVEYS:  07/11/23 FOTO 57 (predicted 60)  COGNITIVE STATUS: Within functional limits for tasks assessed   SENSATION: WFL  POSTURE:  No Significant postural limitations   GAIT: 07/11/23 Comments: independent without significant deviations   PALPATION: 07/11/23 tenderness and trigger points in bil gastroc/soleus complex; Rt plantar fascia   LOWER EXTREMITY ROM:     ROM Right eval Left eval  Ankle dorsiflexion 7 10  Ankle  plantarflexion 64 65  Ankle inversion 28 26  Ankle eversion 14 19   (Blank rows = not tested)  LOWER EXTREMITY MMT:    MMT Right eval Left eval  Ankle dorsiflexion 5/5 5/5  Ankle plantarflexion 5/5 5/5  Ankle inversion 5/5 5/5  Ankle eversion 4/5 3+/5   (Blank rows = not tested)    TREATMENT:                                                                                                                              DATE:  07/11/23 See HEP - demonstrated with trial reps performed PRN, min cues for comprehension     Self Care Educated on heel lift as option in shoe, also discussed trial of short periods out of shoes and orthotics to help strengthen feet muscles.  Recommended continued personal training and use of dynasplint as these seem to be helping.     PATIENT EDUCATION:  Education details: HEP, see above Person educated: Patient Education method: Explanation, Demonstration, and Handouts Education comprehension: verbalized understanding, returned demonstration, and needs further education  HOME EXERCISE PROGRAM: Access Code: [email or web address] URL: [email or web address] Date: 07/11/2023 Prepared by: Moshe Cipro  Exercises - Seated Arch Lifts  - 2-3 x daily - 7 x weekly - 1-2 sets - 10 reps - 5-10 sec hold - Seated Lesser Toes Extension  - 2-3 x daily - 7 x weekly - 1-2 sets - 10 reps - Toe Spreading  - 2-3 x daily - 7 x weekly - 1-2 sets - 10 reps - Seated Great Toe Extension  - 2-3 x daily - 7 x weekly - 1-2 sets - 10 reps - Big toe mobilization  - 2-3 x daily - 7 x weekly - 1-2 sets - 10 reps - Toe Extension Self-Mobilization  - 2-3 x daily - 7 x weekly - 1-2 sets - 10 reps - 5-10 sec hold   ASSESSMENT:  CLINICAL IMPRESSION: Patient is a 48 y.o. female who was seen today for physical therapy evaluation and treatment for bil pain in feet. She demonstrates mild strength and ROM deficits, continued pain and active trigger points affecting functional  mobility.  She will benefit from PT to address deficits listed.     OBJECTIVE IMPAIRMENTS: decreased activity tolerance, decreased ROM, decreased strength, increased fascial restrictions, increased muscle spasms, and pain.   ACTIVITY LIMITATIONS: carrying, lifting, standing, squatting, sleeping, stairs, transfers, and locomotion level  PARTICIPATION LIMITATIONS: meal prep, cleaning, laundry, driving, shopping, and community activity  PERSONAL FACTORS: 3+ comorbidities: HTN, anxiety, depression  are also affecting patient's functional outcome.   REHAB POTENTIAL: Good  CLINICAL DECISION MAKING: Evolving/moderate complexity  EVALUATION COMPLEXITY: Moderate   GOALS: Goals reviewed with patient? Yes  SHORT TERM GOALS: Target date: 08/01/2023  Independent with initial HEP Goal status: INITIAL     LONG TERM GOALS: Target date: 08/22/2023   Independent with final HEP Goal status: INITIAL  2.  FOTO score improved to 60 Goal status: INITIAL  3.  Bil ankle strength improved to 5/5 for improved function and mobility Goal status: INIITAL  4.  Report pain < 3/10 with standing and walking for improved function Goal status: INITIAL   PLAN:  PT FREQUENCY: 1x/week  PT DURATION: 6 weeks  PLANNED INTERVENTIONS: 97164- PT Re-evaluation, 97110-Therapeutic exercises, 97530- Therapeutic activity, 97112- Neuromuscular re-education, 97535- Self Care, 16109- Manual therapy, 97760- Orthotic Fit/training, [id number]- Aquatic Therapy, 97014- Electrical stimulation (unattended), 97016- Vasopneumatic device, [id number]- Ultrasound, [id number]- Ionotophoresis 4mg /ml Dexamethasone, Patient/Family education, Balance training, Stair training, Taping, Dry Needling, Joint mobilization, Joint manipulation, Cryotherapy, and Moist heat.  PLAN FOR NEXT SESSION: (need to wrap up by 3/31 so she can transition to pelvic floor PT), review HEP, continue with intrinsic foot strengthening, compliant surface activities   NEXT  MD VISIT: 08/10/23   Clarita Crane, PT, DPT 07/11/23 2:11 PM

## 2023-07-12 ENCOUNTER — Telehealth (HOSPITAL_BASED_OUTPATIENT_CLINIC_OR_DEPARTMENT_OTHER): Payer: Self-pay | Admitting: *Deleted

## 2023-07-12 NOTE — Telephone Encounter (Signed)
Patient called and would like a order to be put in for her a diagnostic breast ultrasound.

## 2023-07-14 ENCOUNTER — Other Ambulatory Visit: Payer: Self-pay | Admitting: Obstetrics & Gynecology

## 2023-07-14 ENCOUNTER — Encounter (HOSPITAL_COMMUNITY): Payer: Self-pay | Admitting: Orthopedic Surgery

## 2023-07-14 ENCOUNTER — Other Ambulatory Visit: Payer: Self-pay

## 2023-07-14 DIAGNOSIS — Z1231 Encounter for screening mammogram for malignant neoplasm of breast: Secondary | ICD-10-CM

## 2023-07-14 NOTE — Progress Notes (Signed)
For Anesthesia: PCP - Doreene Nest, NP  Cardiologist - Chilton Si, MD    Chest x-ray - greater than 1 year in [facility] EKG - N/A Stress Test - N/A ECHO - 01/24/2019 in [facility] Cardiac Cath - N/A Pacemaker/ICD device last checked: N/A Pacemaker orders received: N/A Device Rep notified: N/A  Spinal Cord Stimulator: N/A  Sleep Study - N/A CPAP - N/A  Fasting Blood Sugar - N/A Checks Blood Sugar ___N/A__ times a day Date and result of last Hgb A1c-N/A  Blood Thinner Instructions:  N/A Aspirin Instructions: N/A Last Dose: N/A  Activity level:  Able to exercise without chest pain and/or shortness of breath    Anesthesia review: N/A  Patient denies shortness of breath, fever, cough and chest pain during pre op phone call.   Patient verbalized understanding of instructions reviewed via telephone.

## 2023-07-14 NOTE — H&P (Signed)
 PREOPERATIVE H&P  Chief Complaint: RIGHT WRIST FRACTUTRE  HPI: Shelley Figueroa is a 48 y.o. female who presents with a diagnosis of RIGHT WRIST FRACTUTRE. Symptoms are rated as moderate to severe, and have been worsening.  This is significantly impairing activities of daily living.  She has elected for surgical management.   Past Medical History:  Diagnosis Date   Allergic rhinitis due to pollen    Anemia    with pregnancy   Anxiety    Arthritis    Asthma    Chronic cystitis    COVID-19 virus infection 05/12/2021   Depression    Dyspareunia in female    Dyspnea on exertion    per pt hard to recover after exercise (cardiologist has echo and cardiac CT ordered when schedule is opened up)   Elevated LFTs 08/16/2022   resolved   Essential hypertension    cardioloigst-  dr t. [facility]-- FH premature CAD--- 08-18-2014 normal stress echo   Foot pain 02/15/2022   History of chronic bronchitis    History of HPV infection    remote   History of kidney stones    History of recurrent UTIs    OA (osteoarthritis)    b. hands and b. feet   Pure hypercholesterolemia 09/20/2018   Renal calculus, left    per pt non-obstructive   Past Surgical History:  Procedure Laterality Date   CESAREAN SECTION  2004, 2005   CYSTOSCOPY N/A 11/06/2018   Procedure: CYSTOSCOPY;  Surgeon: Jerene Bears, MD;  Location: [facility];  Service: Gynecology;  Laterality: N/A;   HYSTEROSCOPY WITH D & C N/A 04/27/2022   Procedure: DILATATION AND CURETTAGE /HYSTEROSCOPY/MYOSURE POLYP RESECTION;  Surgeon: Jerene Bears, MD;  Location: [facility];  Service: Gynecology;  Laterality: N/A;   INTRAUTERINE DEVICE (IUD) INSERTION N/A 11/06/2018   Procedure: INTRAUTERINE DEVICE (IUD) INSERTION WITH ULTRASOUND GUIDANCE;  Surgeon: Jerene Bears, MD;  Location: [facility];  Service: Gynecology;  Laterality: N/A;   INTRAUTERINE DEVICE (IUD) INSERTION N/A 04/27/2022   Procedure:  INTRAUTERINE DEVICE (IUD) INSERTION;  Surgeon: Jerene Bears, MD;  Location: [facility];  Service: Gynecology;  Laterality: N/A;   IUD REMOVAL N/A 11/06/2018   Procedure: INTRAUTERINE DEVICE (IUD) REMOVAL;  Surgeon: Jerene Bears, MD;  Location: [facility];  Service: Gynecology;  Laterality: N/A;  Mirena IUD removal and reinsertion with ultrasound guidance.   IUD REMOVAL N/A 04/27/2022   Procedure: INTRAUTERINE DEVICE (IUD) REMOVAL;  Surgeon: Jerene Bears, MD;  Location: [facility];  Service: Gynecology;  Laterality: N/A;   KNEE ARTHROSCOPY Right 1995   with left knee realignment   KNEE SURGERY Left 1991   NASAL SINUS SURGERY  2015   wtih septoplasty   SEPTOPLASTY     TONSILLECTOMY  1987   Social History   Socioeconomic History   Marital status: Married    Spouse name: Mardelle Matte   Number of children: 2   Years of education: Not on file   Highest education level: Bachelor's degree (e.g., BA, AB, BS)  Occupational History   Occupation: Executive    Comment: [employer]  Tobacco Use   Smoking status: Never    Passive exposure: Past   Smokeless tobacco: Never  Vaping Use   Vaping status: Never Used  Substance and Sexual Activity   Alcohol use: Yes    Alcohol/week: 0.0 - 1.0 standard drinks of alcohol    Comment: rarely  Drug use: Never   Sexual activity: Yes    Partners: Male    Birth control/protection: I.U.D.    Comment: Mirena placed 11/06/18  Other Topics Concern   Not on file  Social History Narrative   From [place] originally   [employer], Class of 1996   Married to Lakia Gritton   2 children   Social Drivers of Health   Financial Resource Strain: Low Risk  (02/07/2023)   Overall Financial Resource Strain (CARDIA)    Difficulty of Paying Living Expenses: Not hard at all  Food Insecurity: No Food Insecurity (02/07/2023)   Hunger Vital Sign    Worried About Running Out of Food in the Last Year: Never true    Ran Out  of Food in the Last Year: Never true  Transportation Needs: No Transportation Needs (02/07/2023)   PRAPARE - Administrator, Civil Service (Medical): No    Lack of Transportation (Non-Medical): No  Physical Activity: Sufficiently Active (02/07/2023)   Exercise Vital Sign    Days of Exercise per Week: 5 days    Minutes of Exercise per Session: 30 min  Stress: Stress Concern Present (02/07/2023)   [employer] of Occupational Health - Occupational Stress Questionnaire    Feeling of Stress : Very much  Social Connections: Moderately Isolated (02/07/2023)   Social Connection and Isolation Panel [NHANES]    Frequency of Communication with Friends and Family: More than three times a week    Frequency of Social Gatherings with Friends and Family: Twice a week    Attends Religious Services: Never    Database administrator or Organizations: No    Attends Engineer, structural: Not on file    Marital Status: Married   Family History  Problem Relation Age of Onset   Hypertension Mother    Hyperlipidemia Mother    Rheum arthritis Mother    Sjogren's syndrome Mother    Hypertension Father    Hyperlipidemia Father    Coronary artery disease Father        MI 51s, smoked, CABG, MULTIPLE STENTS   Kidney failure Father    Bipolar disorder Sister    Alcohol abuse Sister    Depression Sister    Rheum arthritis Sister    Obesity Sister    Kidney Stones Sister    Parkinson's disease Maternal Grandmother    Colon cancer Maternal Grandfather    Alzheimer's disease Paternal Grandmother    Tics Son    Anxiety disorder Son    ADD / ADHD Son    Anxiety disorder Son    Asthma Son    Liver disease Neg Hx    Esophageal cancer Neg Hx    Allergies  Allergen Reactions   Penicillins Hives and Shortness Of Breath   Clindamycin Nausea And Vomiting   Codeine Itching   Contrast Media [Iodinated Contrast Media] Rash and Other (See Comments)    Rash after cardiac CT   Lisinopril  Other (See Comments)    cough   Doxycycline Nausea And Vomiting and Other (See Comments)    Not a true allergy   Erythromycin Nausea And Vomiting   Iohexol Rash   Neomycin Nausea And Vomiting   Prior to Admission medications   Medication Sig Start Date End Date Taking? Authorizing Provider  albuterol (PROVENTIL HFA;VENTOLIN HFA) 108 (90 Base) MCG/ACT inhaler Inhale 2 puffs into the lungs every 6 (six) hours as needed.    [provider]  ALPRAZolam Prudy Feeler)  0.25 MG tablet Take 0.5-2 tablets (0.125-0.5 mg total) by mouth 2 (two) times daily as needed for anxiety. 05/17/23   Melony Overly T, PA-C  azelastine (ASTELIN) 0.1 % nasal spray Place into both nostrils 2 (two) times daily as needed. Use in each nostril as directed    [provider]  buPROPion (WELLBUTRIN XL) 300 MG 24 hr tablet Take 1 tablet (300 mg total) by mouth daily. 11/15/22   Cherie Ouch, PA-C  cetirizine (ZYRTEC) 10 MG tablet Take 10 mg by mouth at bedtime.    [provider]  clindamycin-benzoyl peroxide (BENZACLIN) gel  05/03/18   [provider]  EPINEPHrine 0.3 mg/0.3 mL IJ SOAJ injection     [provider]  estradiol (ESTRACE) 0.1 MG/GM vaginal cream Place 1 g vaginally 3 (three) times a week. 1 gram vaginally twice weekly 06/07/23   Jerene Bears, MD  Eszopiclone 3 MG TABS Take 1 tablet (3 mg total) by mouth at bedtime as needed. Take immediately before bedtime 06/02/23   Gwynneth Macleod  FLOVENT HFA 44 MCG/ACT inhaler Inhale 2 puffs into the lungs 2 (two) times daily. 01/18/22   [provider]  fluticasone (FLONASE SENSIMIST) 27.5 MCG/SPRAY nasal spray     [provider]  L-Lysine HCl 1000 MG TABS Take by mouth as needed.    [provider]  naproxen sodium (ALEVE) 220 MG tablet Take 220 mg by mouth as needed.    [provider]  Spacer/Aero-Holding Chambers [facility] DIAMOND) MISC See admin instructions. 09/28/20   [provider]  valACYclovir (VALTREX) 500 MG tablet Take 1,000 mg by mouth 3 (three) times daily as needed.  10/03/18   [provider]  valsartan-hydrochlorothiazide (DIOVAN-HCT) 160-25 MG tablet TAKE 1 TABLET BY MOUTH EVERY DAY 01/31/23   Chilton Si, MD     Positive ROS: All other systems have been reviewed and were otherwise negative with the exception of those mentioned in the HPI and as above.  Physical Exam: General: Alert, no acute distress Cardiovascular: No pedal edema Respiratory: No cyanosis, no use of accessory musculature GI: No organomegaly, abdomen is soft and non-tender Skin: No lesions in the area of chief complaint Neurologic: Sensation intact distally Psychiatric: Patient is competent for consent with normal mood and affect Lymphatic: No axillary or cervical lymphadenopathy  MUSCULOSKELETAL: RUE in thumb spica splint that is too tight around the thumb and metacarpal heads, better once loosened/adjusted, decreased ROM in fingers, marked edema to fingers, NVI   Imaging: xrays show a right distal radius comminuted, compressed, intra-articular fracture   Assessment: RIGHT WRIST FRACTUTRE  Plan: Plan for Procedure(s): OPEN REDUCTION INTERNAL FIXATION (ORIF) WRIST FRACTURE  The risks benefits and alternatives were discussed with the patient including but not limited to the risks of nonoperative treatment, versus surgical intervention including infection, bleeding, nerve injury,  blood clots, cardiopulmonary complications, morbidity, mortality, among others, and they were willing to proceed.   Weightbearing: NWB RUE Orthopedic devices: splint Showering: POD 3 Dressing: reinforce PRN Medicines: ASA, Dilaudid (per patient as only medicine that works and she can tolerate d/t severe itching with Oxy and Norco), Tylenol, Mobic, Baclofen, Zofran  Discharge: home Follow up: 07/31/23 at 11:30am    [place] Board Office [phone] 07/16/2023 1:58 PM

## 2023-07-14 NOTE — Telephone Encounter (Signed)
Returned pts call. Disregard request

## 2023-07-14 NOTE — Patient Instructions (Signed)
SURGICAL WAITING ROOM VISITATION  Patients having surgery or a procedure may have no more than 2 support people in the waiting area - these visitors may rotate.    Children under the age of 43 must have an adult with them who is not the patient.  Due to an increase in RSV and influenza rates and associated hospitalizations, children ages 24 and under may not visit patients in [facility] hospitals.  Visitors with respiratory illnesses are discouraged from visiting and should remain at home.  If the patient needs to stay at the hospital during part of their recovery, the visitor guidelines for inpatient rooms apply. Pre-op nurse will coordinate an appropriate time for 1 support person to accompany patient in pre-op.  This support person may not rotate.    Please refer to the [facility] website for the visitor guidelines for Inpatients (after your surgery is over and you are in a regular room).       Your procedure is scheduled on: Tuesday, Feb. 18, 2025   Report to [facility] Main Entrance    Report to admitting at 10:45 AM   Call this number if you have problems the morning of surgery [phone]   Do not eat food :After Midnight.   After Midnight you may have the following liquids until 10:00 AM DAY OF SURGERY  Water Non-Citrus Juices (without pulp, NO RED-Apple, White grape, White cranberry) Black Coffee (NO MILK/CREAM OR CREAMERS, sugar ok)  Clear Tea (NO MILK/CREAM OR CREAMERS, sugar ok) regular and decaf                             Plain Jell-O (NO RED)                                           Fruit ices (not with fruit pulp, NO RED)                                     Popsicles (NO RED)                                                               Sports drinks like Gatorade (NO RED)  FOLLOW BOWEL PREP AND ANY ADDITIONAL PRE OP INSTRUCTIONS YOU RECEIVED FROM YOUR SURGEON'S OFFICE!!!     Oral Hygiene is also important to reduce your risk of infection.                                     Remember - BRUSH YOUR TEETH THE MORNING OF SURGERY WITH YOUR REGULAR TOOTHPASTE  DENTURES WILL BE REMOVED PRIOR TO SURGERY PLEASE DO NOT APPLY "Poly grip" OR ADHESIVES!!!   Do NOT smoke after Midnight   Stop all vitamins and herbal supplements 7 days before surgery.   Take these medicines the morning of surgery with A SIP OF WATER:  Bupropion Use Flovent Bring Rescue Inhaler Use nasal spray if needed  You may not have any metal on your body including hair pins, jewelry, and body piercing             Do not wear make-up, lotions, powders, perfumes/cologne, or deodorant  Do not wear nail polish including gel and S&S, artificial/acrylic nails, or any other type of covering on natural nails including finger and toenails. If you have artificial nails, gel coating, etc. that needs to be removed by a nail salon please have this removed prior to surgery or surgery may need to be canceled/ delayed if the surgeon/ anesthesia feels like they are unable to be safely monitored.   Do not shave  48 hours prior to surgery.    Do not bring valuables to the hospital. [facility] IS NOT             RESPONSIBLE   FOR VALUABLES.   Contacts, glasses, dentures or bridgework may not be worn into surgery.   Bring small overnight bag day of surgery.   DO NOT BRING YOUR HOME MEDICATIONS TO THE HOSPITAL. PHARMACY WILL DISPENSE MEDICATIONS LISTED ON YOUR MEDICATION LIST TO YOU DURING YOUR ADMISSION IN THE HOSPITAL!    Patients discharged on the day of surgery will not be allowed to drive home.  Someone NEEDS to stay with you for the first 24 hours after anesthesia.   Special Instructions: Bring a copy of your healthcare power of attorney and living will documents the day of surgery if you haven't scanned them before.              Please read over the following fact sheets you were given: IF YOU HAVE QUESTIONS ABOUT YOUR PRE-OP INSTRUCTIONS PLEASE CALL  [phone]   If you received a COVID test during your pre-op visit  it is requested that you wear a mask when out in public, stay away from anyone that may not be feeling well and notify your surgeon if you develop symptoms. If you test positive for Covid or have been in contact with anyone that has tested positive in the last 10 days please notify you surgeon.

## 2023-07-18 ENCOUNTER — Encounter (HOSPITAL_COMMUNITY): Payer: Self-pay | Admitting: Orthopedic Surgery

## 2023-07-18 ENCOUNTER — Ambulatory Visit (HOSPITAL_BASED_OUTPATIENT_CLINIC_OR_DEPARTMENT_OTHER): Payer: 59 | Admitting: Anesthesiology

## 2023-07-18 ENCOUNTER — Other Ambulatory Visit: Payer: Self-pay

## 2023-07-18 ENCOUNTER — Ambulatory Visit (HOSPITAL_COMMUNITY)
Admission: RE | Admit: 2023-07-18 | Discharge: 2023-07-18 | Disposition: A | Discharge status: Home / Self Care | Payer: 59 | Attending: Orthopedic Surgery | Admitting: Orthopedic Surgery

## 2023-07-18 ENCOUNTER — Encounter (HOSPITAL_COMMUNITY)
Admission: RE | Disposition: A | Discharge status: Home / Self Care | Payer: Self-pay | Source: Home / Self Care | Attending: Orthopedic Surgery

## 2023-07-18 ENCOUNTER — Ambulatory Visit (HOSPITAL_COMMUNITY): Payer: Self-pay | Admitting: Anesthesiology

## 2023-07-18 DIAGNOSIS — X58XXXA Exposure to other specified factors, initial encounter: Secondary | ICD-10-CM | POA: Diagnosis not present

## 2023-07-18 DIAGNOSIS — S62101A Fracture of unspecified carpal bone, right wrist, initial encounter for closed fracture: Secondary | ICD-10-CM | POA: Diagnosis present

## 2023-07-18 DIAGNOSIS — Z79899 Other long term (current) drug therapy: Secondary | ICD-10-CM | POA: Insufficient documentation

## 2023-07-18 DIAGNOSIS — I10 Essential (primary) hypertension: Secondary | ICD-10-CM | POA: Insufficient documentation

## 2023-07-18 DIAGNOSIS — D649 Anemia, unspecified: Secondary | ICD-10-CM

## 2023-07-18 DIAGNOSIS — J449 Chronic obstructive pulmonary disease, unspecified: Secondary | ICD-10-CM | POA: Diagnosis not present

## 2023-07-18 DIAGNOSIS — S52501A Unspecified fracture of the lower end of right radius, initial encounter for closed fracture: Secondary | ICD-10-CM | POA: Diagnosis present

## 2023-07-18 DIAGNOSIS — Z419 Encounter for procedure for purposes other than remedying health state, unspecified: Secondary | ICD-10-CM

## 2023-07-18 HISTORY — PX: ORIF WRIST FRACTURE: SHX2133

## 2023-07-18 HISTORY — DX: Unspecified asthma, uncomplicated: J45.909

## 2023-07-18 HISTORY — DX: Anemia, unspecified: D64.9

## 2023-07-18 HISTORY — DX: Unspecified osteoarthritis, unspecified site: M19.90

## 2023-07-18 LAB — CBC
HCT: 37.8 % (ref 36.0–46.0)
Hemoglobin: 13.3 g/dL (ref 12.0–15.0)
MCH: 30.6 pg (ref 26.0–34.0)
MCHC: 35.2 g/dL (ref 30.0–36.0)
MCV: 86.9 fL (ref 80.0–100.0)
Platelets: 319 10*3/uL (ref 150–400)
RBC: 4.35 MIL/uL (ref 3.87–5.11)
RDW: 12.3 % (ref 11.5–15.5)
WBC: 11.4 10*3/uL — ABNORMAL HIGH (ref 4.0–10.5)
nRBC: 0 % (ref 0.0–0.2)

## 2023-07-18 LAB — BASIC METABOLIC PANEL
Anion gap: 12 (ref 5–15)
BUN: 20 mg/dL (ref 6–20)
CO2: 26 mmol/L (ref 22–32)
Calcium: 8.5 mg/dL — ABNORMAL LOW (ref 8.9–10.3)
Chloride: 100 mmol/L (ref 98–111)
Creatinine, Ser: 0.8 mg/dL (ref 0.44–1.00)
GFR, Estimated: >60 mL/min (ref >60)
Glucose, Bld: 82 mg/dL (ref 70–99)
Potassium: 3.2 mmol/L — ABNORMAL LOW (ref 3.5–5.1)
Sodium: 138 mmol/L (ref 135–145)

## 2023-07-18 LAB — POCT PREGNANCY, URINE: Preg Test, Ur: NEGATIVE

## 2023-07-18 SURGERY — OPEN REDUCTION INTERNAL FIXATION (ORIF) WRIST FRACTURE
Anesthesia: Monitor Anesthesia Care | Site: Wrist | Laterality: Right

## 2023-07-18 MED ORDER — FENTANYL CITRATE PF 50 MCG/ML IJ SOSY: 25.0000 ug | PREFILLED_SYRINGE | INTRAMUSCULAR | Status: DC | PRN

## 2023-07-18 MED ORDER — DEXAMETHASONE SODIUM PHOSPHATE 10 MG/ML IJ SOLN
INTRAMUSCULAR | Status: DC | PRN
  Administered 2023-07-18: 10 mg

## 2023-07-18 MED ORDER — LACTATED RINGERS IV SOLN: INTRAVENOUS | Status: DC

## 2023-07-18 MED ORDER — ACETAMINOPHEN 500 MG PO TABS
1000.0000 mg | ORAL_TABLET | Freq: Once | ORAL | Status: AC
  Administered 2023-07-18: 1000 mg via ORAL
  Filled 2023-07-18: qty 2

## 2023-07-18 MED ORDER — FENTANYL CITRATE (PF) 100 MCG/2ML IJ SOLN
INTRAMUSCULAR | Status: DC | PRN
  Administered 2023-07-18 (×2): 25 ug via INTRAVENOUS

## 2023-07-18 MED ORDER — ACETAMINOPHEN 500 MG PO TABS: 1000.0000 mg | ORAL_TABLET | Freq: Once | ORAL | Status: DC

## 2023-07-18 MED ORDER — ASPIRIN 81 MG PO TBEC: 81.0000 mg | DELAYED_RELEASE_TABLET | Freq: Two times a day (BID) | ORAL | 0 refills | Status: DC

## 2023-07-18 MED ORDER — DEXAMETHASONE SODIUM PHOSPHATE 10 MG/ML IJ SOLN
INTRAMUSCULAR | Status: AC
  Filled 2023-07-18: qty 1

## 2023-07-18 MED ORDER — PROPOFOL 500 MG/50ML IV EMUL
INTRAVENOUS | Status: DC | PRN
  Administered 2023-07-18: 80 ug/kg/min via INTRAVENOUS

## 2023-07-18 MED ORDER — MIDAZOLAM HCL 2 MG/2ML IJ SOLN
INTRAMUSCULAR | Status: AC
  Filled 2023-07-18: qty 2

## 2023-07-18 MED ORDER — MIDAZOLAM HCL 2 MG/2ML IJ SOLN
0.5000 mg | Freq: Once | INTRAMUSCULAR | Status: AC
  Administered 2023-07-18: 2 mg via INTRAVENOUS
  Filled 2023-07-18: qty 2

## 2023-07-18 MED ORDER — FENTANYL CITRATE (PF) 100 MCG/2ML IJ SOLN
INTRAMUSCULAR | Status: AC
  Filled 2023-07-18: qty 2

## 2023-07-18 MED ORDER — PROPOFOL 10 MG/ML IV BOLUS
INTRAVENOUS | Status: AC
  Filled 2023-07-18: qty 20

## 2023-07-18 MED ORDER — HYDROMORPHONE HCL 2 MG PO TABS
2.0000 mg | ORAL_TABLET | Freq: Two times a day (BID) | ORAL | 0 refills | Status: DC | PRN
Start: 2023-07-18 — End: 2023-08-10

## 2023-07-18 MED ORDER — 0.9 % SODIUM CHLORIDE (POUR BTL) OPTIME
TOPICAL | Status: DC | PRN
  Administered 2023-07-18: 1000 mL

## 2023-07-18 MED ORDER — PROPOFOL 10 MG/ML IV BOLUS
INTRAVENOUS | Status: DC | PRN
  Administered 2023-07-18 (×3): 20 mg via INTRAVENOUS

## 2023-07-18 MED ORDER — POVIDONE-IODINE 10 % EX SWAB: 2.0000 | Freq: Once | CUTANEOUS | Status: DC

## 2023-07-18 MED ORDER — ORAL CARE MOUTH RINSE: 15.0000 mL | Freq: Once | OROMUCOSAL | Status: AC

## 2023-07-18 MED ORDER — CHLORHEXIDINE GLUCONATE 0.12 % MT SOLN
15.0000 mL | Freq: Once | OROMUCOSAL | Status: AC
  Administered 2023-07-18: 15 mL via OROMUCOSAL

## 2023-07-18 MED ORDER — LIDOCAINE HCL (PF) 2 % IJ SOLN
INTRAMUSCULAR | Status: AC
  Filled 2023-07-18: qty 5

## 2023-07-18 MED ORDER — ONDANSETRON 4 MG PO TBDP: 4.0000 mg | ORAL_TABLET | Freq: Three times a day (TID) | ORAL | 0 refills | Status: DC | PRN

## 2023-07-18 MED ORDER — ONDANSETRON HCL 4 MG/2ML IJ SOLN
INTRAMUSCULAR | Status: DC | PRN
  Administered 2023-07-18: 4 mg via INTRAVENOUS

## 2023-07-18 MED ORDER — CEFAZOLIN SODIUM-DEXTROSE 2-4 GM/100ML-% IV SOLN
2.0000 g | INTRAVENOUS | Status: AC
  Administered 2023-07-18: 2 g via INTRAVENOUS
  Filled 2023-07-18: qty 100

## 2023-07-18 MED ORDER — ONDANSETRON HCL 4 MG/2ML IJ SOLN
INTRAMUSCULAR | Status: AC
  Filled 2023-07-18: qty 2

## 2023-07-18 MED ORDER — ROPIVACAINE HCL 5 MG/ML IJ SOLN
INTRAMUSCULAR | Status: DC | PRN
  Administered 2023-07-18: 25 mL via PERINEURAL

## 2023-07-18 MED ORDER — FENTANYL CITRATE PF 50 MCG/ML IJ SOSY
25.0000 ug | PREFILLED_SYRINGE | Freq: Once | INTRAMUSCULAR | Status: AC
  Administered 2023-07-18: 50 ug via INTRAVENOUS
  Filled 2023-07-18: qty 2

## 2023-07-18 MED ORDER — DEXAMETHASONE SODIUM PHOSPHATE 4 MG/ML IJ SOLN
INTRAMUSCULAR | Status: DC | PRN
  Administered 2023-07-18: 5 mg via INTRAVENOUS

## 2023-07-18 MED ORDER — BACLOFEN 10 MG PO TABS: 10.0000 mg | ORAL_TABLET | Freq: Three times a day (TID) | ORAL | 0 refills | Status: DC | PRN

## 2023-07-18 SURGICAL SUPPLY — 39 items
BAG COUNTER SPONGE SURGICOUNT (BAG) ×1 IMPLANT
BAG ZIPLOCK 12X15 (MISCELLANEOUS) ×1 IMPLANT
BIT DRILL 2.2 SS TIBIAL (BIT) IMPLANT
BNDG ELASTIC 2INX 5YD STR LF (GAUZE/BANDAGES/DRESSINGS) IMPLANT
BNDG ELASTIC 4INX 5YD STR LF (GAUZE/BANDAGES/DRESSINGS) ×1 IMPLANT
CLSR STERI-STRIP ANTIMIC 1/2X4 (GAUZE/BANDAGES/DRESSINGS) ×1 IMPLANT
CORD BIPOLAR FORCEPS 12FT (ELECTRODE) ×1 IMPLANT
COVER SURGICAL LIGHT HANDLE (MISCELLANEOUS) ×1 IMPLANT
CUFF TOURN SGL QUICK 18X4 (TOURNIQUET CUFF) ×1 IMPLANT
DRAPE OEC MINIVIEW 54X84 (DRAPES) ×1 IMPLANT
DRAPE U-SHAPE 47X51 STRL (DRAPES) ×1 IMPLANT
DRIVER BIT SQUARE 1.7/2.2 (TRAUMA) IMPLANT
ELECT REM PT RETURN 15FT ADLT (MISCELLANEOUS) ×1 IMPLANT
GAUZE PAD ABD 8X10 STRL (GAUZE/BANDAGES/DRESSINGS) ×1 IMPLANT
GAUZE SPONGE 4X4 12PLY STRL (GAUZE/BANDAGES/DRESSINGS) ×1 IMPLANT
GLOVE BIO SURGEON STRL SZ7.5 (GLOVE) ×2 IMPLANT
GLOVE BIOGEL PI IND STRL 7.5 (GLOVE) ×1 IMPLANT
GLOVE BIOGEL PI IND STRL 8 (GLOVE) ×1 IMPLANT
GOWN STRL REUS W/ TWL LRG LVL3 (GOWN DISPOSABLE) ×1 IMPLANT
K-WIRE FX5X1.6XNS BN SS (WIRE) ×3 IMPLANT
KIT BASIN OR (CUSTOM PROCEDURE TRAY) ×1 IMPLANT
KIT TURNOVER KIT A (KITS) IMPLANT
KWIRE FX5X1.6XNS BN SS (WIRE) IMPLANT
MANIFOLD NEPTUNE II (INSTRUMENTS) ×1 IMPLANT
PACK ORTHO EXTREMITY (CUSTOM PROCEDURE TRAY) ×1 IMPLANT
PAD CAST 4YDX4 CTTN HI CHSV (CAST SUPPLIES) ×1 IMPLANT
PEG LOCKING SMOOTH 2.2X16 (Screw) IMPLANT
PEG LOCKING SMOOTH 2.2X18 (Peg) IMPLANT
PEG LOCKING SMOOTH 2.2X20 (Screw) IMPLANT
PLATE DVR CROSSLOCK STD RT (Plate) IMPLANT
PROTECTOR NERVE ULNAR (MISCELLANEOUS) ×1 IMPLANT
SCREW LOCK 14X2.7X 3 LD TPR (Screw) IMPLANT
SPLINT PLASTER CAST FAST 5X30 (CAST SUPPLIES) IMPLANT
SUT MNCRL AB 4-0 PS2 18 (SUTURE) ×1 IMPLANT
SUT VIC AB 2-0 CT1 TAPERPNT 27 (SUTURE) ×1 IMPLANT
SUT VIC AB 2-0 CT2 27 (SUTURE) IMPLANT
TOWEL OR 17X26 10 PK STRL BLUE (TOWEL DISPOSABLE) ×1 IMPLANT
TUBING CONNECTING 10 (TUBING) ×1 IMPLANT
UNDERPAD 30X36 HEAVY ABSORB (UNDERPADS AND DIAPERS) ×1 IMPLANT

## 2023-07-18 NOTE — Anesthesia Postprocedure Evaluation (Signed)
 Anesthesia Post Note  Patient: Shelley Figueroa  Procedure(s) Performed: OPEN REDUCTION INTERNAL FIXATION (ORIF) WRIST FRACTURE (Right: Wrist)     Patient location during evaluation: PACU Anesthesia Type: Regional and MAC Level of consciousness: awake and alert Pain management: pain level controlled Vital Signs Assessment: post-procedure vital signs reviewed and stable Respiratory status: spontaneous breathing, nonlabored ventilation, respiratory function stable and patient connected to nasal cannula oxygen Cardiovascular status: stable and blood pressure returned to baseline Postop Assessment: no apparent nausea or vomiting Anesthetic complications: no  No notable events documented.  Last Vitals:  Vitals:   07/18/23 1346 07/18/23 1400  BP:  135/85  Pulse:  72  Resp:  13  Temp:    SpO2: 97% 95%    Last Pain:  Vitals:   07/18/23 1400  TempSrc:   PainSc: 0-No pain                 [name]

## 2023-07-18 NOTE — Discharge Instructions (Addendum)

## 2023-07-18 NOTE — Anesthesia Preprocedure Evaluation (Addendum)
 Anesthesia Evaluation  Patient identified by MRN, date of birth, ID band Patient awake    Reviewed: Allergy & Precautions, NPO status , Patient's Chart, lab work & pertinent test results  Airway Mallampati: II  TM Distance: >3 FB Neck ROM: Full    Dental no notable dental hx. (+) Teeth Intact, Dental Advisory Given   Pulmonary asthma    Pulmonary exam normal breath sounds clear to auscultation       Cardiovascular hypertension, Pt. on medications Normal cardiovascular exam Rhythm:Regular Rate:Normal  TTE 2020 Normal EF, no significant valvular abnormalities   Neuro/Psych  PSYCHIATRIC DISORDERS Anxiety Depression    negative neurological ROS     GI/Hepatic negative GI ROS, Neg liver ROS,,,  Endo/Other  negative endocrine ROS    Renal/GU negative Renal ROS  negative genitourinary   Musculoskeletal negative musculoskeletal ROS (+)    Abdominal   Peds  Hematology negative hematology ROS (+)   Anesthesia Other Findings   Reproductive/Obstetrics                             Anesthesia Physical Anesthesia Plan  ASA: 2  Anesthesia Plan: MAC and Regional   Post-op Pain Management: Regional block* and Tylenol PO (pre-op)*   Induction: Intravenous  PONV Risk Score and Plan: 2 and Propofol infusion, Treatment may vary due to age or medical condition, Midazolam, Ondansetron and Dexamethasone  Airway Management Planned: Natural Airway  Additional Equipment:   Intra-op Plan:   Post-operative Plan:   Informed Consent: I have reviewed the patients History and Physical, chart, labs and discussed the procedure including the risks, benefits and alternatives for the proposed anesthesia with the patient or authorized representative who has indicated his/her understanding and acceptance.     Dental advisory given  Plan Discussed with: CRNA  Anesthesia Plan Comments:        Anesthesia  Quick Evaluation

## 2023-07-18 NOTE — Op Note (Signed)
 07/18/2023  1:28 PM  PATIENT:  Shelley Figueroa    PRE-OPERATIVE DIAGNOSIS:  RIGHT WRIST FRACTUTRE  POST-OPERATIVE DIAGNOSIS:  Same  PROCEDURE:  OPEN REDUCTION INTERNAL FIXATION (ORIF) WRIST FRACTURE  SURGEON:  Sheral Apley, MD  ASSISTANT: Levester Fresh, PA-C, he was present and scrubbed throughout the case, critical for completion in a timely fashion, and for retraction, instrumentation, and closure.   ANESTHESIA:   block  PREOPERATIVE INDICATIONS:  Shelley Figueroa is a  48 y.o. female with a diagnosis of RIGHT WRIST FRACTUTRE who failed conservative measures and elected for surgical management.    The risks benefits and alternatives were discussed with the patient preoperatively including but not limited to the risks of infection, bleeding, nerve injury, cardiopulmonary complications, the need for revision surgery, among others, and the patient was willing to proceed.  OPERATIVE IMPLANTS: DVR plate  OPERATIVE FINDINGS: unstable fx  BLOOD LOSS: min  COMPLICATIONS: none  TOURNIQUET TIME: 35  OPERATIVE PROCEDURE:  Patient was identified in the preoperative holding area and site was marked by me She was transported to the operating theater and placed on the table in supine position taking care to pad all bony prominences. After a preincinduction time out anesthesia was induced. The Right upper extremity was prepped and draped in normal sterile fashion and a pre-incision timeout was performed. She received ancef for preoperative antibiotics.   I made a 5 cm incision centered over her FCR tendon and dissected down carefully to the level of the flexor tendon sheath and incise this longitudinally and retracted the FCR radially and incised the dorsal aspect of the sheath.   I bluntly dissected the FPL muscle belly away from the brachioradialis and then sharply incised the pronator tendon from the distal radius and from the wrist capsule. I Elevated this off the bone the  fractures visible.   I released the brachioradialis from its insertion. I then debrided the fracture and performed a manual reduction. There were at least 3 articular pieces of the fracture.  I selected a plate and I placed it on the bone. I pinned it into place and was happy on multiple radiographic views with it's placement. I then fixed the plate distally with the locking pegs. I confirmed no articular penetration with the pegs and that none were prominent dorsally.   I then reduced the plate to the shaft improving the volar and radial tilt of her distal radius.  I was happy with the final fluoro xrays. I reviewed more than 3 views of the wrist including obliques and ap/lat   I thoroughly irrigated the wound and closed the pronator over top of the plate and then closed the skin in layers with absorbable stitch. Sterile dressing was applied using the PACU in stable condition.  POST OPERATIVE PLAN: NWB, Splint full time. Ambulate for DVT px.

## 2023-07-18 NOTE — Transfer of Care (Signed)
 Immediate Anesthesia Transfer of Care Note  Patient: Shelley Figueroa  Procedure(s) Performed: OPEN REDUCTION INTERNAL FIXATION (ORIF) WRIST FRACTURE (Right: Wrist)  Patient Location: PACU  Anesthesia Type:MAC and Regional  Level of Consciousness: awake, alert , oriented, and patient cooperative  Airway & Oxygen Therapy: Patient Spontanous Breathing  Post-op Assessment: Report given to RN and Post -op Vital signs reviewed and stable  Post vital signs: Reviewed and stable  Last Vitals:  Vitals Value Taken Time  BP 137/85 07/18/23 1345  Temp    Pulse 72 07/18/23 1347  Resp 12 07/18/23 1347  SpO2 95 % 07/18/23 1347  Vitals shown include unfiled device data.  Last Pain:  Vitals:   07/18/23 1210  TempSrc:   PainSc: 0-No pain      Patients Stated Pain Goal: 4 (07/18/23 1100)  Complications: No notable events documented.

## 2023-07-18 NOTE — Anesthesia Procedure Notes (Signed)
 Procedure Name: MAC Date/Time: 07/18/2023 12:18 PM  Performed by: Dennison Nancy, CRNAPre-anesthesia Checklist: Patient identified, Emergency Drugs available, Suction available, Patient being monitored and Timeout performed Patient Re-evaluated:Patient Re-evaluated prior to induction Oxygen Delivery Method: Simple face mask Preoxygenation: Pre-oxygenation with 100% oxygen Induction Type: IV induction Dental Injury: Teeth and Oropharynx as per pre-operative assessment

## 2023-07-18 NOTE — Anesthesia Procedure Notes (Signed)
 Anesthesia Regional Block: Supraclavicular block   Pre-Anesthetic Checklist: , timeout performed,  Correct Patient, Correct Site, Correct Laterality,  Correct Procedure, Correct Position, site marked,  Risks and benefits discussed,  Pre-op evaluation,  At surgeon's request and post-op pain management  Laterality: Right  Prep: Maximum Sterile Barrier Precautions used, chloraprep       Needles:  Injection technique: Single-shot  Needle Type: Echogenic Stimulator Needle     Needle Length: 5cm  Needle Gauge: 21     Additional Needles:   Procedures:,,,, ultrasound used (permanent image in chart),,    Narrative:  Start time: 07/18/2023 11:57 AM End time: 07/18/2023 12:00 PM Injection made incrementally with aspirations every 5 mL. Anesthesiologist: Elmer Picker, MD

## 2023-07-20 ENCOUNTER — Encounter (HOSPITAL_COMMUNITY): Payer: Self-pay | Admitting: Orthopedic Surgery

## 2023-07-25 ENCOUNTER — Encounter: Payer: 59 | Admitting: Rehabilitative and Restorative Service Providers"

## 2023-07-27 ENCOUNTER — Encounter: Payer: 59 | Admitting: Physical Therapy

## 2023-07-27 NOTE — Progress Notes (Signed)
 Office Visit Note  Patient: Shelley Figueroa             Date of Birth: [birth year]           MRN: [id number]             PCP: Doreene Nest, NP Referring: Doreene Nest, NP Visit Date: 08/10/2023 Occupation: @GUAROCC @  Subjective:  Pain in multiple joints  History of Present Illness: ELLENE BLOODSAW is a 48 y.o. female with osteoarthritis.  She returns today after her initial visit on July 06, 2023.  She states after her initial visit she went for physical therapy following week which was helpful.  She states on February 12 she slipped in a restaurant on tile floor and fractured her right radius at 3 places.  She was seen by Dr. Eulah Pont and had plate placement with the screws.  She is in a brace now.  She is going to [facility] basis.  She is also working on a physical therapy for her hands.  Patient states she has been doing some stretches for plantar fasciitis.  She states her plantar fasciitis is better.  Since she has been more sedentary she has been experiencing some discomfort in the SI joints.    Activities of Daily Living:  Patient reports morning stiffness for 10-15 minutes.   Patient Reports nocturnal pain.  Difficulty dressing/grooming: Denies Difficulty climbing stairs: Denies Difficulty getting out of chair: Denies Difficulty using hands for taps, buttons, cutlery, and/or writing: Reports  Review of Systems  Constitutional:  Positive for fatigue.  HENT:  Negative for mouth sores and mouth dryness.   Eyes:  Negative for dryness.  Respiratory:  Negative for shortness of breath and difficulty breathing.   Cardiovascular:  Negative for chest pain and palpitations.  Gastrointestinal:  Negative for blood in stool, constipation and diarrhea.  Endocrine: Negative for increased urination.  Genitourinary:  Negative for involuntary urination.  Musculoskeletal:  Positive for joint pain, joint pain, joint swelling and morning  stiffness. Negative for gait problem, myalgias, muscle weakness, muscle tenderness and myalgias.  Skin:  Negative for color change, rash, hair loss and sensitivity to sunlight.  Allergic/Immunologic: Negative for susceptible to infections.  Neurological:  Negative for dizziness and headaches.  Hematological:  Negative for swollen glands.  Psychiatric/Behavioral:  Positive for sleep disturbance. Negative for depressed mood. The patient is not nervous/anxious.     PMFS History:  Patient Active Problem List   Diagnosis Date Noted   Closed fracture of right distal radius 07/18/2023   Elevated LFTs 08/16/2022   Encounter for removal of intrauterine contraceptive device (IUD) 04/27/2022   Foot pain 02/15/2022   Joint pain in both hands 01/29/2021   Family history of coronary artery disease in father 07/30/2019   Contrast media allergy 07/30/2019   Insomnia 06/15/2018   Hyperlipidemia 04/06/2018   Encounter for annual general medical examination with abnormal findings in adult 12/28/2016   Asthma, chronic 05/28/2014   Essential hypertension 05/28/2014   ADJ DISORDER WITH MIXED ANXIETY & DEPRESSED MOOD 07/15/2009   Allergic rhinitis 07/06/2009    Past Medical History:  Diagnosis Date   Allergic rhinitis due to pollen    Anemia    with pregnancy   Anxiety    Arthritis    Asthma    Chronic cystitis    COVID-19 virus infection 05/12/2021   Depression    Dyspareunia in female    Dyspnea on exertion  per pt hard to recover after exercise (cardiologist has echo and cardiac CT ordered when schedule is opened up)   Elevated LFTs 08/16/2022   resolved   Essential hypertension    cardioloigst-  dr t. [facility]-- FH premature CAD--- 08-18-2014 normal stress echo   Foot pain 02/15/2022   History of chronic bronchitis    History of HPV infection    remote   History of kidney stones    History of recurrent UTIs    OA (osteoarthritis)    b. hands and b. feet   Pure hypercholesterolemia  09/20/2018   Renal calculus, left    per pt non-obstructive    Family History  Problem Relation Age of Onset   Hypertension Mother    Hyperlipidemia Mother    Rheum arthritis Mother    Sjogren's syndrome Mother    Hypertension Father    Hyperlipidemia Father    Coronary artery disease Father        MI 67s, smoked, CABG, MULTIPLE STENTS   Kidney failure Father    Bipolar disorder Sister    Alcohol abuse Sister    Depression Sister    Rheum arthritis Sister    Obesity Sister    Kidney Stones Sister    Parkinson's disease Maternal Grandmother    Colon cancer Maternal Grandfather    Alzheimer's disease Paternal Grandmother    Tics Son    Anxiety disorder Son    ADD / ADHD Son    Anxiety disorder Son    Asthma Son    Liver disease Neg Hx    Esophageal cancer Neg Hx    Past Surgical History:  Procedure Laterality Date   CESAREAN SECTION  2004, 2005   CYSTOSCOPY N/A 11/06/2018   Procedure: CYSTOSCOPY;  Surgeon: Jerene Bears, MD;  Location: [facility];  Service: Gynecology;  Laterality: N/A;   HYSTEROSCOPY WITH D & C N/A 04/27/2022   Procedure: DILATATION AND CURETTAGE /HYSTEROSCOPY/MYOSURE POLYP RESECTION;  Surgeon: Jerene Bears, MD;  Location: [facility];  Service: Gynecology;  Laterality: N/A;   INTRAUTERINE DEVICE (IUD) INSERTION N/A 11/06/2018   Procedure: INTRAUTERINE DEVICE (IUD) INSERTION WITH ULTRASOUND GUIDANCE;  Surgeon: Jerene Bears, MD;  Location: [facility] [place];  Service: Gynecology;  Laterality: N/A;   INTRAUTERINE DEVICE (IUD) INSERTION N/A 04/27/2022   Procedure: INTRAUTERINE DEVICE (IUD) INSERTION;  Surgeon: Jerene Bears, MD;  Location: [facility];  Service: Gynecology;  Laterality: N/A;   IUD REMOVAL N/A 11/06/2018   Procedure: INTRAUTERINE DEVICE (IUD) REMOVAL;  Surgeon: Jerene Bears, MD;  Location: [facility];  Service: Gynecology;  Laterality: N/A;  Mirena IUD removal and  reinsertion with ultrasound guidance.   IUD REMOVAL N/A 04/27/2022   Procedure: INTRAUTERINE DEVICE (IUD) REMOVAL;  Surgeon: Jerene Bears, MD;  Location: [facility];  Service: Gynecology;  Laterality: N/A;   KNEE ARTHROSCOPY Right 1995   with left knee realignment   KNEE SURGERY Left 1991   NASAL SINUS SURGERY  2015   wtih septoplasty   ORIF WRIST FRACTURE Right 07/18/2023   Procedure: OPEN REDUCTION INTERNAL FIXATION (ORIF) WRIST FRACTURE;  Surgeon: Sheral Apley, MD;  Location: [facility];  Service: Orthopedics;  Laterality: Right;   SEPTOPLASTY     TONSILLECTOMY  1987   Social History   Social History Narrative   From [place] originally   [facility] [place] [facility]   Married to [place]   2  children   Immunization History  Administered Date(s) Administered   Influenza Whole 03/16/2007   Influenza, High Dose Seasonal PF 03/12/2018, 03/25/2019, 05/10/2019   Influenza, Seasonal, Injecte, Preservative Fre 02/08/2023   Influenza,inj,Quad PF,6+ Mos 03/11/2018, 01/22/2019, 03/11/2021   Influenza-Unspecified 01/22/2019, 03/03/2020, 01/19/2022   Moderna Sars-Covid-2 Vaccination 08/02/2019, 08/30/2019, 04/20/2020   PFIZER(Purple Top)SARS-COV-2 Vaccination 03/11/2021   Pfizer Covid-19 Vaccine Bivalent Booster 28yrs & up 02/25/2022   Tdap 11/27/2017     Objective: Vital Signs: BP 129/82 (BP Location: Left Arm, Patient Position: Sitting, Cuff Size: Normal)   Pulse 80   Resp 15   Ht 5\' 6"  (1.676 m)   Wt 156 lb 6.4 oz (70.9 kg)   BMI 25.24 kg/m    Physical Exam Vitals and nursing note reviewed.  Constitutional:      Appearance: She is well-developed.  HENT:     Head: Normocephalic and atraumatic.  Eyes:     Conjunctiva/sclera: Conjunctivae normal.  Cardiovascular:     Rate and Rhythm: Normal rate and regular rhythm.     Heart sounds: Normal heart sounds.  Pulmonary:     Effort: Pulmonary effort is normal.     Breath sounds: Normal breath  sounds.  Abdominal:     General: Bowel sounds are normal.     Palpations: Abdomen is soft.  Musculoskeletal:     Cervical back: Normal range of motion.  Lymphadenopathy:     Cervical: No cervical adenopathy.  Skin:    General: Skin is warm and dry.     Capillary Refill: Capillary refill takes less than 2 seconds.  Neurological:     Mental Status: She is alert and oriented to person, place, and time.  Psychiatric:        Behavior: Behavior normal.      Musculoskeletal Exam: Cervical, thoracic and lumbar spine 1 good range of motion.  Shoulders, and elbows were in good range of motion.  Her right forearm was in a brace due to recent fracture.  She had splint on her left index finger.  No synovitis was noted.  Hip joints and knee joints in good range of motion without any warmth swelling or effusion.  There was no tenderness over ankles or MTPs.  CDAI Exam: CDAI Score: -- Patient Global: --; Provider Global: -- Swollen: --; Tender: -- Joint Exam 08/10/2023   No joint exam has been documented for this visit   There is currently no information documented on the homunculus. Go to the Rheumatology activity and complete the homunculus joint exam.  Investigation: No additional findings.  Imaging: No results found.   Recent Labs: Lab Results  Component Value Date   WBC 11.4 (H) 07/18/2023   HGB 13.3 07/18/2023   PLT 319 07/18/2023   NA 138 07/18/2023   K 3.2 (L) 07/18/2023   CL 100 07/18/2023   CO2 26 07/18/2023   GLUCOSE 82 07/18/2023   BUN 20 07/18/2023   CREATININE 0.80 07/18/2023   BILITOT 0.7 02/08/2023   ALKPHOS 68 02/08/2023   AST 21 02/08/2023   ALT 28 02/08/2023   PROT 6.6 02/08/2023   ALBUMIN 4.2 02/08/2023   CALCIUM 8.5 (L) 07/18/2023   GFRAA 94 11/06/2019   February 08, 2023 uric acid 5.0, RF negative, anti-CCP negative sed rate 8, CRP less than 1 Speciality Comments: No specialty comments available.  Procedures:  No procedures performed Allergies:  Penicillins, Clindamycin, Codeine, Contrast media [iodinated contrast media], Lisinopril, Hydrocodone-acetaminophen, Oxycodone-acetaminophen, Doxycycline, Erythromycin, Iohexol, and Neomycin   Assessment / Plan:  Visit Diagnoses: Primary osteoarthritis of both hands -I am she continues to have some stiffness in her hands.  She states she has been going to physical therapy and doing exercises at home which are helpful.  X-rays were suggestive of osteoarthritis.  Fracture of left index finger distal phalanx with nonunion was noted at the last visit.  Patient states she had repeat x-rays by Dr. Eulah Pont which showed healing of the bone.  Closed nondisplaced fracture of distal phalanx of left index finger, sequela - Use of finger splint advised at the last visit.  Closed fracture of distal end of the radius, sequela-patient fell and fractured her right radius on February 12.  She was operated by Dr. Eulah Pont and has a plate now.  She states she is gradually recovering from it.  She has been taking calcium and vitamin D.  Chondromalacia patellae, left knee-x-rays obtained at the last visit showed left knee joint chondromalacia patella.  Lower extremity muscle strength exercises were discussed.  Chronic pain of both knees -she is not mitten pain in her knee joints.  X-rays of bilateral knee joints were unremarkable except for left knee joint showed chondromalacia patella.  Muscle strengthening was discussed.  Primary osteoarthritis of both feet -x-rays were reviewed with the patient.  X-rays obtained at the last visit were suggestive of osteoarthritis.  Fitting shoes were advised.  Plantar fasciitis, bilateral - Patient was referred to physical therapy.  Patient states that she could not go much for physical therapy as she had right wrist fracture and she is going to physical therapy more focused on her hands.  Chronic SI joint pain - History of intermittent discomfort.  Stretching exercises were  demonstrated in the office today.  Essential hypertension-blood pressure was elevated at 143/86.  Repeat blood pressure was 129/82.  Other medical problems are listed as follows:  Mixed hyperlipidemia  Mild intermittent chronic asthma without complication  Seasonal allergic rhinitis due to other allergic trigger  Anxiety and depression  Other insomnia  Contrast media allergy  Family history of rheumatoid arthritis-mother and sister  Family history of osteoarthritis-maternal grandmother  Orders: No orders of the defined types were placed in this encounter.  No orders of the defined types were placed in this encounter.    Follow-Up Instructions: Return in about 1 year (around 08/09/2024) for Osteoarthritis.   Pollyann Savoy, MD  Note - This record has been created using Animal nutritionist.  Chart creation errors have been sought, but may not always  have been located. Such creation errors do not reflect on  the standard of medical care.

## 2023-07-29 ENCOUNTER — Other Ambulatory Visit (HOSPITAL_BASED_OUTPATIENT_CLINIC_OR_DEPARTMENT_OTHER): Payer: Self-pay | Admitting: Cardiovascular Disease

## 2023-07-29 ENCOUNTER — Other Ambulatory Visit: Payer: Self-pay | Admitting: Physician Assistant

## 2023-08-01 NOTE — Therapy (Signed)
 OUTPATIENT OCCUPATIONAL THERAPY TREATMENT NOTE  Patient Name: ANANDA CAYA MRN: [id number] DOB:11-13-1975, 48 y.o., female Today's Date: 08/01/2023  PCP: Allayne Gitelman NP  REFERRING PROVIDER: Pollyann Savoy, MD   END OF SESSION:    Past Medical History:  Diagnosis Date   Allergic rhinitis due to pollen    Anemia    with pregnancy   Anxiety    Arthritis    Asthma    Chronic cystitis    COVID-19 virus infection 05/12/2021   Depression    Dyspareunia in female    Dyspnea on exertion    per pt hard to recover after exercise (cardiologist has echo and cardiac CT ordered when schedule is opened up)   Elevated LFTs 08/16/2022   resolved   Essential hypertension    cardioloigst-  dr t. [facility]-- FH premature CAD--- 08-18-2014 normal stress echo   Foot pain 02/15/2022   History of chronic bronchitis    History of HPV infection    remote   History of kidney stones    History of recurrent UTIs    OA (osteoarthritis)    b. hands and b. feet   Pure hypercholesterolemia 09/20/2018   Renal calculus, left    per pt non-obstructive   Past Surgical History:  Procedure Laterality Date   CESAREAN SECTION  2004, 2005   CYSTOSCOPY N/A 11/06/2018   Procedure: CYSTOSCOPY;  Surgeon: Jerene Bears, MD;  Location: [facility];  Service: Gynecology;  Laterality: N/A;   HYSTEROSCOPY WITH D & C N/A 04/27/2022   Procedure: DILATATION AND CURETTAGE /HYSTEROSCOPY/MYOSURE POLYP RESECTION;  Surgeon: Jerene Bears, MD;  Location: [facility];  Service: Gynecology;  Laterality: N/A;   INTRAUTERINE DEVICE (IUD) INSERTION N/A 11/06/2018   Procedure: INTRAUTERINE DEVICE (IUD) INSERTION WITH ULTRASOUND GUIDANCE;  Surgeon: Jerene Bears, MD;  Location: [facility] [place];  Service: Gynecology;  Laterality: N/A;   INTRAUTERINE DEVICE (IUD) INSERTION N/A 04/27/2022   Procedure: INTRAUTERINE DEVICE (IUD) INSERTION;  Surgeon: Jerene Bears, MD;  Location: [facility]  [place];  Service: Gynecology;  Laterality: N/A;   IUD REMOVAL N/A 11/06/2018   Procedure: INTRAUTERINE DEVICE (IUD) REMOVAL;  Surgeon: Jerene Bears, MD;  Location: [facility];  Service: Gynecology;  Laterality: N/A;  Mirena IUD removal and reinsertion with ultrasound guidance.   IUD REMOVAL N/A 04/27/2022   Procedure: INTRAUTERINE DEVICE (IUD) REMOVAL;  Surgeon: Jerene Bears, MD;  Location: [facility];  Service: Gynecology;  Laterality: N/A;   KNEE ARTHROSCOPY Right 1995   with left knee realignment   KNEE SURGERY Left 1991   NASAL SINUS SURGERY  2015   wtih septoplasty   ORIF WRIST FRACTURE Right 07/18/2023   Procedure: OPEN REDUCTION INTERNAL FIXATION (ORIF) WRIST FRACTURE;  Surgeon: Sheral Apley, MD;  Location: [facility];  Service: Orthopedics;  Laterality: Right;   SEPTOPLASTY     TONSILLECTOMY  1987   Patient Active Problem List   Diagnosis Date Noted   Closed fracture of right distal radius 07/18/2023   Elevated LFTs 08/16/2022   Encounter for removal of intrauterine contraceptive device (IUD) 04/27/2022   Foot pain 02/15/2022   Joint pain in both hands 01/29/2021   Family history of coronary artery disease in father 07/30/2019   Contrast media allergy 07/30/2019   Insomnia 06/15/2018   Hyperlipidemia 04/06/2018   Encounter for annual general medical examination with abnormal findings in adult 12/28/2016   Asthma, chronic 05/28/2014   Essential  hypertension 05/28/2014   ADJ DISORDER WITH MIXED ANXIETY & DEPRESSED MOOD 07/15/2009   Allergic rhinitis 07/06/2009    ONSET DATE: 1-2 years, DOI 05/12/23  REFERRING DIAG:  M25.541,M25.542 (ICD-10-CM) - Joint pain in both hands    THERAPY DIAG:  No diagnosis found.  Rationale for Evaluation and Treatment: Rehabilitation  PERTINENT HISTORY: Finger fracture in 05/12/2023, chronic arthritis, plantar fasciitis She states she broke her Lt IF on 05/12/23, and it went untreated and  has a nonhealing fracture for which she just started wearing a prefabricated metal anger splint.  She also has chronic bil hand pain R> L, primarily in the base of her thumbs as well as her tip joints of her fingers with some overt Heberden's nodes. She states neg for auto-immune issues.  Also here for PT eval for plantar fascitis.   PRECAUTIONS: None  RED FLAGS: None   WEIGHT BEARING RESTRICTIONS: Yes less than 5 pounds in left hand recommended for the next month and nothing painful   SUBJECTIVE:   SUBJECTIVE STATEMENT: She states ***.     PAIN:  Are you having pain? Yes: NPRS scale: constant pain *** 3-5/10 on average depending on activity.  Pain location: Bilateral thumb CMC joints as well as DIP joints and the left index finger Pain description: Aching Aggravating factors: Repetitive or heavy gripping Relieving factors: Rest and heat   PATIENT GOALS: To improve pain in both of her hands and also adequately heal her finger fracture  NEXT MD VISIT: 08/10/2023    OBJECTIVE: (All objective assessments below are from initial evaluation on: 07/11/23 unless otherwise specified.)   HAND DOMINANCE: Right   ADLs: Overall ADLs: States decreased ability to grab, hold household objects, pain and difficulty to open containers, perform FMS tasks (manipulate fasteners on clothing)   FUNCTIONAL OUTCOME MEASURES: Eval: Quick DASH 27% impairment today  (Higher % Score  =  More Impairment)     UPPER EXTREMITY ROM     Shoulder to Wrist AROM Right eval Left eval  Forearm supination 87 88  Forearm pronation  80 80  Wrist flexion 71 76  Wrist extension 73 77  (Blank rows = not tested)   Hand AROM Right eval Left eval  Full Fist Ability (or Gap to Distal Palmar Crease) Full fist but IF is tight  Unable with left hand due to finger fracture  Thumb Opposition  (Kapandji Scale)  9.5/10 10/10  IF MCP   TBD  IF PIP J   TBD  IF DIP J    TBD  (Blank rows = not tested)   UPPER  EXTREMITY MMT:    Eval:  NT at eval due to recent and still healing injuries. Will be tested when appropriate.   MMT Right TBD Left TBD  Shoulder flexion    Shoulder abduction    Shoulder adduction    Shoulder extension    Shoulder internal rotation    Shoulder external rotation    Middle trapezius    Lower trapezius    Elbow flexion    Elbow extension    Forearm supination    Forearm pronation    Wrist flexion    Wrist extension    Wrist ulnar deviation    Wrist radial deviation    (Blank rows = not tested)  HAND FUNCTION: Eval: Observed weakness in affected bil hands.  Grip strength Right: 53.6 lbs tender, Left: NT lbs   COORDINATION: Eval: Observed coordination impairments with affected bil hands. TBD baseline measures.  9  Hole Peg Test Right: TBDsec, Left: TBD sec (TBD sec is WFL)   EDEMA:   Eval:  Mildly swollen in bilateral hands DIP joints and especially left index finger DIP joint due to fracture  OBSERVATIONS:   Eval: Some overt Heberden's nodes, tenderness about the CMC joints of the thumb, some overt stiffness especially with broken left index finger   TODAY'S TREATMENT:  08/02/23: *** Check orthosis immobilizing the DIP joint of the left hand index finger, check on initial recommendations and home exercise program.    Post-evaluation treatment:   Custom orthotic fabrication was indicated due to pt's broken left hand index finger distal phalanx shaft fracture and need for safe, functional positioning. OT fabricated custom DIP joint immobilization orthosis for pt today to protect the healing fracture. It fit well with no areas of pressure, pt states a comfortable fit. Pt was educated on the wearing schedule (on at all times for likely 4 weeks), to call or come in ASAP if it is causing any irritation or is not achieving desired function. It will be checked/adjusted in upcoming sessions, as needed. Pt states understanding all directions.     Additionally she is  here to help her manage her arthritis in both hands, and she was given safety/self-care education on this including modifying tasks, using protective padding or gloves, and performing a home exercise program to loosen the joints and maintain supple dexterity.  These exercises are listed below and each was quickly shown to her today but will likely need review next week.  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  Exercises - HOOK Stretch  - 4 x daily - 3-5 reps - 15-20 sec hold - Seated Wrist Flexion Stretch  - 3 x daily - 3 reps - 15 hold - Wrist Prayer Stretch  - 3 x daily - 3 reps - 15 sec hold - Stretch Thumb DOWNWARD  - 3 x daily - 3 reps - 15 sec hold - Thumb Webspace Stretch  - 3 x daily - 3 reps - 15 sec hold - Towel Roll Grip with Forearm in Neutral  - 3 x daily - 5 reps - 10 sec hold   PATIENT EDUCATION: Education details: See tx section above for details  Person educated: Patient Education method: Verbal Instruction, Teach back, Handouts  Education comprehension: States and demonstrates understanding, Additional Education required    HOME EXERCISE PROGRAM: Access Code: [email or web address] URL: [email or web address] Date: 07/11/2023 Prepared by: Fannie Knee   GOALS: Goals reviewed with patient? Yes   SHORT TERM GOALS: (STG required if POC>30 days) Target Date: 07/28/2023  Pt will obtain protective, custom orthotic. Goal status: 07/11/2023 MET   2.  Pt will demo/state understanding of initial HEP to improve pain levels and prerequisite motion. Goal status: INITIAL   LONG TERM GOALS: Target Date: 09/08/2023  Pt will improve functional ability by decreased impairment per Quick DASH assessment from 27% to 15% or better, for better quality of life. Goal status: INITIAL  2.  Pt will improve grip strength in right hand from tender 53 lbs  to at least nontender 56 lbs for functional use at home and in IADLs. Goal status: INITIAL  3.  Pt will improve A/ROM in right hand full fist from loose fist with index finger extended to at least a tight full fist, to have functional motion for tasks like reach  and grasp.  Goal status: INITIAL  4.  Pt will improve A/ROM in left hand index finger total active motion from limited range of motion due to broken DIP joint to at least 210 degrees total active motion, to have functional motion for tasks like reach and grasp.  Goal status: INITIAL  5.  Pt will improve coordination skills in bilateral hands, as seen by within functional limit score on nine-hole peg testing to have increased functional ability to carry out fine motor tasks (fasteners, etc.) and more complex, coordinated IADLs (meal prep, sports, etc.).  Goal status: INITIAL  6.  Pt will decrease pain at rest from 3-5/10 to 1-3/10 or better to have better sleep and occupational participation in daily roles. Goal status: INITIAL  ASSESSMENT:  CLINICAL IMPRESSION: 08/02/23: ***  Eval: Patient is a 47y.o. female who was seen today for occupational therapy evaluation for fractured left hand index finger as well as bilateral hand arthritis primarily in the [facility] joints as well as the DIP joints.  This causes pain, stiffness, decreased coordination and functional ability per her self-report's, but she will benefit from outpatient occupational therapy to improve her symptoms and increase hand function and quality of life.      PLAN:  OT FREQUENCY: 1-2x/week  OT DURATION: 8 weeks through 09/08/2023 and up to 8 total visits as needed (extended time due to fracture finger and need for at least 1 month of healing)  PLANNED INTERVENTIONS: 40981 OT Re-evaluation, 97535 self care/ADL training, 19147 therapeutic exercise, 97530 therapeutic activity, 97112 neuromuscular re-education, 97140 manual therapy, 97035 ultrasound, 97039 fluidotherapy, 97010  moist heat, 97010 cryotherapy, 97760 Orthotics management and training, 82956 Splinting (initial encounter), [id number] Subsequent splinting/medication, Dry needling, energy conservation, coping strategies training, patient/family education, and DME and/or AE instructions   CONSULTED AND AGREED WITH PLAN OF CARE: Patient  PLAN FOR NEXT SESSION:   ***   Fannie Knee, OTR/L, CHT 08/01/2023, 6:32 PM

## 2023-08-02 ENCOUNTER — Encounter: Payer: Self-pay | Admitting: Rehabilitative and Restorative Service Providers"

## 2023-08-02 ENCOUNTER — Encounter: Payer: 59 | Admitting: Physical Therapy

## 2023-08-02 ENCOUNTER — Ambulatory Visit (INDEPENDENT_AMBULATORY_CARE_PROVIDER_SITE_OTHER): Payer: 59 | Admitting: Rehabilitative and Restorative Service Providers"

## 2023-08-02 DIAGNOSIS — M25531 Pain in right wrist: Secondary | ICD-10-CM

## 2023-08-02 DIAGNOSIS — M6281 Muscle weakness (generalized): Secondary | ICD-10-CM

## 2023-08-02 DIAGNOSIS — M25631 Stiffness of right wrist, not elsewhere classified: Secondary | ICD-10-CM | POA: Diagnosis not present

## 2023-08-02 DIAGNOSIS — M25641 Stiffness of right hand, not elsewhere classified: Secondary | ICD-10-CM

## 2023-08-02 DIAGNOSIS — R600 Localized edema: Secondary | ICD-10-CM

## 2023-08-02 DIAGNOSIS — M79642 Pain in left hand: Secondary | ICD-10-CM

## 2023-08-02 DIAGNOSIS — R278 Other lack of coordination: Secondary | ICD-10-CM

## 2023-08-08 NOTE — Therapy (Signed)
 OUTPATIENT OCCUPATIONAL THERAPY TREATMENT NOTE  Patient Name: Shelley Figueroa MRN: [id number] DOB:11-08-1975, 48 y.o., female Today's Date: 08/09/2023  PCP: Allayne Gitelman NP  REFERRING PROVIDER: Margarita Rana, MD   END OF SESSION:  OT End of Session - 08/09/23 1304     Visit Number 2    Number of Visits 10    Date for OT Re-Evaluation 09/15/23    Authorization Type [employer]    OT Start Time 1304    OT Stop Time 1345    OT Time Calculation (min) 41 min    Equipment Utilized During Treatment --    Activity Tolerance Patient tolerated treatment well;No increased pain;Patient limited by fatigue;Patient limited by pain    Behavior During Therapy [facility] for tasks assessed/performed               Past Medical History:  Diagnosis Date   Allergic rhinitis due to pollen    Anemia    with pregnancy   Anxiety    Arthritis    Asthma    Chronic cystitis    COVID-19 virus infection 05/12/2021   Depression    Dyspareunia in female    Dyspnea on exertion    per pt hard to recover after exercise (cardiologist has echo and cardiac CT ordered when schedule is opened up)   Elevated LFTs 08/16/2022   resolved   Essential hypertension    cardioloigst-  dr t. [facility]-- FH premature CAD--- 08-18-2014 normal stress echo   Foot pain 02/15/2022   History of chronic bronchitis    History of HPV infection    remote   History of kidney stones    History of recurrent UTIs    OA (osteoarthritis)    b. hands and b. feet   Pure hypercholesterolemia 09/20/2018   Renal calculus, left    per pt non-obstructive   Past Surgical History:  Procedure Laterality Date   CESAREAN SECTION  2004, 2005   CYSTOSCOPY N/A 11/06/2018   Procedure: CYSTOSCOPY;  Surgeon: Jerene Bears, MD;  Location: [facility];  Service: Gynecology;  Laterality: N/A;   HYSTEROSCOPY WITH D & C N/A 04/27/2022   Procedure: DILATATION AND CURETTAGE /HYSTEROSCOPY/MYOSURE POLYP RESECTION;  Surgeon: Jerene Bears,  MD;  Location: [facility];  Service: Gynecology;  Laterality: N/A;   INTRAUTERINE DEVICE (IUD) INSERTION N/A 11/06/2018   Procedure: INTRAUTERINE DEVICE (IUD) INSERTION WITH ULTRASOUND GUIDANCE;  Surgeon: Jerene Bears, MD;  Location: [facility];  Service: Gynecology;  Laterality: N/A;   INTRAUTERINE DEVICE (IUD) INSERTION N/A 04/27/2022   Procedure: INTRAUTERINE DEVICE (IUD) INSERTION;  Surgeon: Jerene Bears, MD;  Location: [facility];  Service: Gynecology;  Laterality: N/A;   IUD REMOVAL N/A 11/06/2018   Procedure: INTRAUTERINE DEVICE (IUD) REMOVAL;  Surgeon: Jerene Bears, MD;  Location: [facility];  Service: Gynecology;  Laterality: N/A;  Mirena IUD removal and reinsertion with ultrasound guidance.   IUD REMOVAL N/A 04/27/2022   Procedure: INTRAUTERINE DEVICE (IUD) REMOVAL;  Surgeon: Jerene Bears, MD;  Location: [facility];  Service: Gynecology;  Laterality: N/A;   KNEE ARTHROSCOPY Right 1995   with left knee realignment   KNEE SURGERY Left 1991   NASAL SINUS SURGERY  2015   wtih septoplasty   ORIF WRIST FRACTURE Right 07/18/2023   Procedure: OPEN REDUCTION INTERNAL FIXATION (ORIF) WRIST FRACTURE;  Surgeon: Sheral Apley, MD;  Location: [facility];  Service: Orthopedics;  Laterality: Right;  SEPTOPLASTY     TONSILLECTOMY  1987   Patient Active Problem List   Diagnosis Date Noted   Closed fracture of right distal radius 07/18/2023   Elevated LFTs 08/16/2022   Encounter for removal of intrauterine contraceptive device (IUD) 04/27/2022   Foot pain 02/15/2022   Joint pain in both hands 01/29/2021   Family history of coronary artery disease in father 07/30/2019   Contrast media allergy 07/30/2019   Insomnia 06/15/2018   Hyperlipidemia 04/06/2018   Encounter for annual general medical examination with abnormal findings in adult 12/28/2016   Asthma, chronic 05/28/2014   Essential hypertension 05/28/2014    ADJ DISORDER WITH MIXED ANXIETY & DEPRESSED MOOD 07/15/2009   Allergic rhinitis 07/06/2009    ONSET DATE: DOS: 07/18/23, DOI 07/12/23  REFERRING DIAG:  N82.956,O13.086 (ICD-10-CM) - Joint pain in both hands    THERAPY DIAG:  Localized edema  Muscle weakness (generalized)  Pain in right wrist  Stiffness of right wrist, not elsewhere classified  Stiffness of right hand, not elsewhere classified  Pain in left hand  Other lack of coordination  Pain in right hand  Stiffness of left hand, not elsewhere classified  Rationale for Evaluation and Treatment: Rehabilitation  PERTINENT HISTORY: New fall and distal radius fracture with subsequent surgery  07/18/23. This is being evaluated in OT 08/02/23.   Was previously being seen in OT for hand arthritis and that info is as follows (that POC will be put on hold): Finger fracture in 05/12/2023, chronic arthritis, plantar fasciitis She states she broke her Lt IF on 05/12/23, and it went untreated and has a nonhealing fracture for which she just started wearing a prefabricated metal anger splint.  She also has chronic bil hand pain R> L, primarily in the base of her thumbs as well as her tip joints of her fingers with some overt Heberden's nodes. She states neg for auto-immune issues.  Also here for PT eval for plantar fascitis.   PRECAUTIONS: None; RED FLAGS: None   WEIGHT BEARING RESTRICTIONS: Yes less than 5 pounds in left hand recommended for the next month and nothing painful   SUBJECTIVE:   SUBJECTIVE STATEMENT: Now approx 3 weeks post-op Rt DRF and ORIF. She states being very pleased with her progress and feeling like her motion is greatly improving.  She also feels like her left index finger is feeling better and is less painful since last seen and no orthosis was fabricated (4 weeks in immobilization for left index finger now).     PAIN:  Are you having pain? Yes: NPRS scale: constant pain 1-2/10 on average depending on  activity.  Pain location: Rt wrist/thumb Pain description: Aching Aggravating factors: Repetitive or heavy gripping Relieving factors: Rest and heat   PATIENT GOALS: To improve pain in both of her hands and also adequately heal her finger fracture  NEXT MD VISIT: 08/10/2023    OBJECTIVE: (All objective assessments below are from updated evaluation on: 08/02/23 unless otherwise specified.)   HAND DOMINANCE: Right   ADLs: Overall ADLs: States decreased ability to grab, hold household objects, pain and difficulty to open containers, perform FMS tasks (manipulate fasteners on clothing)   FUNCTIONAL OUTCOME MEASURES: 08/02/23: Quick DASH: 72% issues now after DRF   07/11/23: Quick DASH 27% impairment today  (Higher % Score  =  More Impairment)     UPPER EXTREMITY ROM     Shoulder to Wrist AROM Right 07/11/23 Left 07/11/23 Right  08/02/23 Rt 08/09/23  Forearm supination 87 88  56 72  Forearm pronation  80 80 74 84  Wrist flexion 71 76 21 28  Wrist extension 73 77 26 29  Rad dev    19  Ulnar dev    28  (Blank rows = not tested)   Hand AROM Right 07/11/23 Left 07/11/23 Rt 08/02/23 Rt 08/09/23  Full Fist Ability (or Gap to Distal Palmar Crease) Full fist but IF is tight  Unable with left hand due to finger fracture Fingers barely touch palm today Full fist  Thumb Opposition  (Kapandji Scale)  9.5/10 10/10 4/10 8/10  Thumb MCP J    0-46   Thumb IP J    0-2   Radial Abduction   34*   (Blank rows = not tested)   UPPER EXTREMITY MMT:    Eval:  NT at eval due to recent and still healing injuries. Will be tested when appropriate.   MMT Right TBD Left TBD  Shoulder flexion    Shoulder abduction    Shoulder adduction    Shoulder extension    Shoulder internal rotation    Shoulder external rotation    Middle trapezius    Lower trapezius    Elbow flexion    Elbow extension    Forearm supination    Forearm pronation    Wrist flexion    Wrist extension    Wrist ulnar deviation     Wrist radial deviation    (Blank rows = not tested)  HAND FUNCTION: Eval 08/02/23: not tested due to healing fxs   07/11/23:  Observed weakness in affected bil hands.  Grip strength Right: 53.6 lbs tender, Left: NT lbs   COORDINATION: 08/02/23 Eval: Observed coordination impairments with affected bil hands. TBD baseline measures.  9 Hole Peg Test Right: TBDsec, Left: TBD sec (TBD sec is WFL)   EDEMA:   08/02/23: Mildly swollen in right distal radius compared to left- 16.8cm right wrist vs 15cm left wrist  OBSERVATIONS:   08/02/23 Eval: Right wrist surgical area has some bruising and ecchymosis, otherwise looks clean and covered by Steri-Strips.  Left index finger distal phalanx largely nontender mild angulation, x-rays seem to show interval healing.   07/11/23 Eval: Some overt Heberden's nodes, tenderness about the CMC joints of the thumb, some overt stiffness especially with broken left index finger   TODAY'S TREATMENT:  08/09/23: She starts with active range of motion for review of exercise as well as new measures which shows excellent improvement at the forearm and the wrist.  As she is having little to no pain and is now 3 weeks postop, we use moist heat around her wrist to loosen her tissues while giving her exercises upgrades concurrently.  The new stretches are listed below and these were explained, demonstrated and done with her-she demonstrates back with no significant pain or problems.  Again for self-care/safety she is told to do no strenuous or painful gripping pushing or pulling with either hand.  In the left hand she can start to wean from her index finger orthosis 4-6 times a day for exercise program cautiously.  She states understanding all directions, and at the end of the session states her prefabricated brace is irritating her and hurting her, and OT states a custom orthosis may be indicated but there is not enough time in the session to address it now.  We may address this in the  next session if needed.     Exercises - Forearm Supination Stretch  - 3-4 x daily -  3-5 reps - 15 sec hold - Wrist Flexion Stretch  - 4 x daily - 3-5 reps - 15 sec hold - Wrist Extension Stretch Pronated  - 4 x daily - 3-5 reps - 15 hold - Seated Finger Composite Flexion Stretch  - 4 x daily - 3-5 reps - 15 hold - Thumb stretch  - 4 x daily - 3-5 reps - 15-20 sec hold - Turn Palm Facing Up & Down  - 4-6 x daily - 5 reps - Bend and Pull Back Wrist SLOWLY  - 4 x daily - 5 reps - "Windshield Wipers"   - 4 x daily - 5 reps - Wrist AROM Dart Throwers Motion  - 4 x daily - 5 reps - Tendon Glides  - 4-6 x daily - 3-5 reps - 2-3 seconds hold   PATIENT EDUCATION: Education details: See tx section above for details  Person educated: Patient Education method: Engineer, structural, Teach back, Handouts  Education comprehension: States and demonstrates understanding, Additional Education required    HOME EXERCISE PROGRAM: Wrist Program:  Access Code: [email or web address] URL: [email or web address] Date: 08/02/2023 Prepared by: Fannie Knee  OA Program: Access Code: [email or web address] URL: [email or web address] Date: 07/11/2023 Prepared by: Fannie Knee   GOALS: Goals reviewed with patient? Yes   SHORT TERM GOALS: (STG required if POC>30 days) Target Date: 07/28/2023  Pt will obtain protective, custom orthotic. Goal status: 07/11/2023 MET   2.  Pt will demo/state understanding of initial HEP to improve pain levels and prerequisite motion. Goal status: INITIAL   LONG TERM GOALS: Target Date: 09/08/2023  Pt will improve functional ability by decreased impairment per Quick DASH assessment from 27% to 15% or better, for better quality of life. Goal status: INITIAL  2.  Pt will improve grip strength in right hand from tender 53 lbs to at least nontender 56 lbs for functional use at home and in IADLs. Goal status: INITIAL  3.  Pt will improve A/ROM in right hand full fist  from loose fist with index finger extended to at least a tight full fist, to have functional motion for tasks like reach and grasp.  Goal status: INITIAL  4.  Pt will improve A/ROM in left hand index finger total active motion from limited range of motion due to broken DIP joint to at least 210 degrees total active motion, to have functional motion for tasks like reach and grasp.  Goal status: INITIAL  5.  Pt will improve coordination skills in bilateral hands, as seen by within functional limit score on nine-hole peg testing to have increased functional ability to carry out fine motor tasks (fasteners, etc.) and more complex, coordinated IADLs (meal prep, sports, etc.).  Goal status: INITIAL  6.  Pt will decrease pain at rest from 3-5/10 to 1-3/10 or better to have better sleep and occupational participation in daily roles. Goal status: INITIAL  ASSESSMENT:  CLINICAL IMPRESSION: 08/09/23: Right wrist and forearm greatly improving, left hand also doing better and now will start weaning from left index finger orthosis for active range of motion.  Carry on  08/02/23 Eval: Ms. Kotowski returns today with a new order for therapy evaluation for a new right distal radius fracture status post ORIF.  As she was here semi-recently for evaluation of bilateral hand arthritis and left index finger fx (07/11/23), OT included those measurements and today's evaluation for baselines and will continue to monitor her for these problems as well.  She has new onset of stiffness, weakness, swelling, pain in  the right wrist and arm which has further decreased her functional ability.  She will benefit from outpatient occupational therapy now to start working on this new issue as well as to continue working on her other more chronic issues.   07/11/23 Eval: Patient is a 47y.o. female who was seen today for occupational therapy evaluation for fractured left hand index finger as well as bilateral hand arthritis primarily in  the [facility] joints as well as the DIP joints.  This causes pain, stiffness, decreased coordination and functional ability per her self-report's, but she will benefit from outpatient occupational therapy to improve her symptoms and increase hand function and quality of life.      PLAN:  OT FREQUENCY: 1-2x/week  OT DURATION: 6 weeks through 09/15/2023 and up to 10 total visits as needed  PLANNED INTERVENTIONS: 97168 OT Re-evaluation, 97535 self care/ADL training, 41324 therapeutic exercise, 97530 therapeutic activity, 97112 neuromuscular re-education, 97140 manual therapy, 97035 ultrasound, 97039 fluidotherapy, 97010 moist heat, 97010 cryotherapy, 97760 Orthotics management and training, 40102 Splinting (initial encounter), [id number] Subsequent splinting/medication, Dry needling, energy conservation, coping strategies training, patient/family education, and DME and/or AE instructions   CONSULTED AND AGREED WITH PLAN OF CARE: Patient  PLAN FOR NEXT SESSION:   Continue to wean from left index finger orthosis now for light functional activities also stretches. For right wrist, check new stretches and upgrade and modify as necessary, consider custom orthosis if her prefabricated brace is still causing pain and pressure to her sore wrist.   Fannie Knee, OTR/L, CHT 08/09/2023, 2:24 PM

## 2023-08-09 ENCOUNTER — Ambulatory Visit (INDEPENDENT_AMBULATORY_CARE_PROVIDER_SITE_OTHER): Admitting: Rehabilitative and Restorative Service Providers"

## 2023-08-09 ENCOUNTER — Encounter: Payer: Self-pay | Admitting: Rehabilitative and Restorative Service Providers"

## 2023-08-09 DIAGNOSIS — M6281 Muscle weakness (generalized): Secondary | ICD-10-CM

## 2023-08-09 DIAGNOSIS — M79641 Pain in right hand: Secondary | ICD-10-CM

## 2023-08-09 DIAGNOSIS — M25642 Stiffness of left hand, not elsewhere classified: Secondary | ICD-10-CM

## 2023-08-09 DIAGNOSIS — M25641 Stiffness of right hand, not elsewhere classified: Secondary | ICD-10-CM

## 2023-08-09 DIAGNOSIS — M25631 Stiffness of right wrist, not elsewhere classified: Secondary | ICD-10-CM

## 2023-08-09 DIAGNOSIS — M25531 Pain in right wrist: Secondary | ICD-10-CM | POA: Diagnosis not present

## 2023-08-09 DIAGNOSIS — R600 Localized edema: Secondary | ICD-10-CM | POA: Diagnosis not present

## 2023-08-09 DIAGNOSIS — R278 Other lack of coordination: Secondary | ICD-10-CM

## 2023-08-09 DIAGNOSIS — M79642 Pain in left hand: Secondary | ICD-10-CM

## 2023-08-10 ENCOUNTER — Ambulatory Visit: Payer: 59 | Attending: Rheumatology | Admitting: Rheumatology

## 2023-08-10 ENCOUNTER — Encounter: Payer: Self-pay | Admitting: Rheumatology

## 2023-08-10 ENCOUNTER — Encounter: Payer: 59 | Admitting: Rehabilitative and Restorative Service Providers"

## 2023-08-10 ENCOUNTER — Encounter: Payer: 59 | Admitting: Physical Therapy

## 2023-08-10 VITALS — BP 129/82 | HR 80 | Resp 15 | Ht 66.0 in | Wt 156.4 lb

## 2023-08-10 DIAGNOSIS — M25562 Pain in left knee: Secondary | ICD-10-CM

## 2023-08-10 DIAGNOSIS — S52591S Other fractures of lower end of right radius, sequela: Secondary | ICD-10-CM

## 2023-08-10 DIAGNOSIS — Z8261 Family history of arthritis: Secondary | ICD-10-CM

## 2023-08-10 DIAGNOSIS — M19042 Primary osteoarthritis, left hand: Secondary | ICD-10-CM

## 2023-08-10 DIAGNOSIS — I10 Essential (primary) hypertension: Secondary | ICD-10-CM

## 2023-08-10 DIAGNOSIS — M722 Plantar fascial fibromatosis: Secondary | ICD-10-CM

## 2023-08-10 DIAGNOSIS — M19071 Primary osteoarthritis, right ankle and foot: Secondary | ICD-10-CM

## 2023-08-10 DIAGNOSIS — E782 Mixed hyperlipidemia: Secondary | ICD-10-CM

## 2023-08-10 DIAGNOSIS — J3089 Other allergic rhinitis: Secondary | ICD-10-CM

## 2023-08-10 DIAGNOSIS — F32A Depression, unspecified: Secondary | ICD-10-CM

## 2023-08-10 DIAGNOSIS — M2242 Chondromalacia patellae, left knee: Secondary | ICD-10-CM

## 2023-08-10 DIAGNOSIS — Z91041 Radiographic dye allergy status: Secondary | ICD-10-CM

## 2023-08-10 DIAGNOSIS — S62661S Nondisplaced fracture of distal phalanx of left index finger, sequela: Secondary | ICD-10-CM | POA: Diagnosis not present

## 2023-08-10 DIAGNOSIS — G8929 Other chronic pain: Secondary | ICD-10-CM

## 2023-08-10 DIAGNOSIS — Z8269 Family history of other diseases of the musculoskeletal system and connective tissue: Secondary | ICD-10-CM

## 2023-08-10 DIAGNOSIS — M25561 Pain in right knee: Secondary | ICD-10-CM

## 2023-08-10 DIAGNOSIS — G4709 Other insomnia: Secondary | ICD-10-CM

## 2023-08-10 DIAGNOSIS — M533 Sacrococcygeal disorders, not elsewhere classified: Secondary | ICD-10-CM

## 2023-08-10 DIAGNOSIS — M19041 Primary osteoarthritis, right hand: Secondary | ICD-10-CM

## 2023-08-10 DIAGNOSIS — J4520 Mild intermittent asthma, uncomplicated: Secondary | ICD-10-CM

## 2023-08-10 DIAGNOSIS — F419 Anxiety disorder, unspecified: Secondary | ICD-10-CM

## 2023-08-10 DIAGNOSIS — M19072 Primary osteoarthritis, left ankle and foot: Secondary | ICD-10-CM

## 2023-08-10 NOTE — Patient Instructions (Signed)
 Exercises for Chronic Knee Pain Chronic knee pain is pain that lasts longer than 3 months. For most people with chronic knee pain, exercise and weight loss is an important part of treatment. Your health care provider may want you to focus on: Making the muscles that support your knee stronger. This can take pressure off your knee and reduce pain. Preventing knee stiffness. How far you can move your knee, keeping it there or making it farther. Losing weight (if this applies) to take pressure off your knee, lower your risk for injury, and make it easier for you to exercise. Your provider will help you make an exercise program that fits your needs and physical abilities. Below are simple, low-impact exercises you can do at home. Ask your provider or physical therapist how often you should do your exercise program and how many times to repeat each exercise. General safety tips  Get your provider's approval before doing any exercises. Start slowly and stop any time you feel pain. Do not exercise if your knee pain is flaring up. Warm up first. Stretching a cold muscle can cause an injury. Do 5-10 minutes of easy movement or light stretching before beginning your exercises. Do 5-10 minutes of low-impact activity (like walking or cycling) before starting strengthening exercises. Contact your provider any time you have pain during or after exercising. Exercise can cause discomfort but should not be painful. It is normal to be a little stiff or sore after exercising. Stretching and range-of-motion exercises Front thigh stretch  Stand up straight and support your body by holding on to a chair or resting one hand on a wall. With your legs straight and close together, bend one knee to lift your heel up toward your butt. Using one hand for support, grab your ankle with your free hand. Pull your foot up closer toward your butt to feel the stretch in front of your thigh. Hold the stretch for 30  seconds. Repeat __________ times. Complete this exercise __________ times a day. Back thigh stretch  Sit on the floor with your back straight and your legs out straight in front of you. Place the palms of your hands on the floor and slide them toward your feet as you bend at the hip. Try to touch your nose to your knees and feel the stretch in the back of your thighs. Hold for 30 seconds. Repeat __________ times. Complete this exercise __________ times a day. Calf stretch  Stand facing a wall. Place the palms of your hands flat against the wall, arms extended, and lean slightly against the wall. Get into a lunge position with one leg bent at the knee and the other leg stretched out straight behind you. Keep both feet facing the wall and increase the bend in your knee while keeping the heel of the other leg flat on the ground. You should feel the stretch in your calf. Hold for 30 seconds. Repeat __________ times. Complete this exercise __________ times a day. Strengthening exercises Straight leg lift  Lie on your back with one knee bent and the other leg out straight. Slowly lift the straight leg without bending the knee. Lift until your foot is about 12 inches (30 cm) off the floor. Hold for 3-5 seconds and slowly lower your leg. Repeat __________ times. Complete this exercise __________ times a day. Single leg dip  Stand between two chairs and put both hands on the backs of the chairs for support. Extend one leg out straight with your body  weight resting on the heel of the standing leg. Slowly bend your standing knee to dip your body to the level that is comfortable for you. Hold for 3-5 seconds. Repeat __________ times. Complete this exercise __________ times a day. Hamstring curls  Stand straight, knees close together, facing the back of a chair. Hold on to the back of a chair with both hands. Keep one leg straight. Bend the other knee while bringing the heel up toward the butt  until the knee is bent at a 90-degree angle (right angle). Hold for 3-5 seconds. Repeat __________ times. Complete this exercise __________ times a day. Wall squat  Stand straight with your back, hips, and head against a wall. Step forward one foot at a time with your back still against the wall. Your feet should be 2 feet (61 cm) from the wall at shoulder width. Keeping your back, hips, and head against the wall, slide down the wall to as close to a sitting position as you can get. Hold for 5-10 seconds, then slowly slide back up. Repeat __________ times. Complete this exercise __________ times a day. Step-ups  Stand in front of a sturdy platform or stool that is about 6 inches (15 cm) high. Slowly step up with your left / right foot, keeping your knee in line with your hip and foot. Do not let your knee bend so far that you cannot see your toes. Hold on to a chair for balance, but do not use it for support. Slowly unlock your knee and lower yourself to the starting position. Repeat __________ times. Complete this exercise __________ times a day. Contact a health care provider if: Your exercises cause pain. Your pain is worse after you exercise. Your pain prevents you from doing your exercises. This information is not intended to replace advice given to you by your health care provider. Make sure you discuss any questions you have with your health care provider. Document Revised: 05/31/2022 Document Reviewed: 05/31/2022 [employer] Patient Education  2024 [employer]   Low Back Sprain or Strain Rehab Ask your health care provider which exercises are safe for you. Do exercises exactly as told by your health care provider and adjust them as directed. It is normal to feel mild stretching, pulling, tightness, or discomfort as you do these exercises. Stop right away if you feel sudden pain or your pain gets worse. Do not begin these exercises until told by your health care provider. Stretching  and range-of-motion exercises These exercises warm up your muscles and joints and improve the movement and flexibility of your back. These exercises also help to relieve pain, numbness, and tingling. Lumbar rotation  Lie on your back on a firm bed or the floor with your knees bent. Straighten your arms out to your sides so each arm forms a 90-degree angle (right angle) with a side of your body. Slowly move (rotate) both of your knees to one side of your body until you feel a stretch in your lower back (lumbar). Try not to let your shoulders lift off the floor. Hold this position for __________ seconds. Tense your abdominal muscles and slowly move your knees back to the starting position. Repeat this exercise on the other side of your body. Repeat __________ times. Complete this exercise __________ times a day. Single knee to chest  Lie on your back on a firm bed or the floor with both legs straight. Bend one of your knees. Use your hands to move your knee up toward  your chest until you feel a gentle stretch in your lower back and buttock. Hold your leg in this position by holding on to the front of your knee. Keep your other leg as straight as possible. Hold this position for __________ seconds. Slowly return to the starting position. Repeat with your other leg. Repeat __________ times. Complete this exercise __________ times a day. Prone extension on elbows  Lie on your abdomen on a firm bed or the floor (prone position). Prop yourself up on your elbows. Use your arms to help lift your chest up until you feel a gentle stretch in your abdomen and your lower back. This will place some of your body weight on your elbows. If this is uncomfortable, try stacking pillows under your chest. Your hips should stay down, against the surface that you are lying on. Keep your hip and back muscles relaxed. Hold this position for __________ seconds. Slowly relax your upper body and return to the  starting position. Repeat __________ times. Complete this exercise __________ times a day. Strengthening exercises These exercises build strength and endurance in your back. Endurance is the ability to use your muscles for a long time, even after they get tired. Pelvic tilt This exercise strengthens the muscles that lie deep in the abdomen. Lie on your back on a firm bed or the floor with your legs extended. Bend your knees so they are pointing toward the ceiling and your feet are flat on the floor. Tighten your lower abdominal muscles to press your lower back against the floor. This motion will tilt your pelvis so your tailbone points up toward the ceiling instead of pointing to your feet or the floor. To help with this exercise, you may place a small towel under your lower back and try to push your back into the towel. Hold this position for __________ seconds. Let your muscles relax completely before you repeat this exercise. Repeat __________ times. Complete this exercise __________ times a day. Alternating arm and leg raises  Get on your hands and knees on a firm surface. If you are on a hard floor, you may want to use padding, such as an exercise mat, to cushion your knees. Line up your arms and legs. Your hands should be directly below your shoulders, and your knees should be directly below your hips. Lift your left leg behind you. At the same time, raise your right arm and straighten it in front of you. Do not lift your leg higher than your hip. Do not lift your arm higher than your shoulder. Keep your abdominal and back muscles tight. Keep your hips facing the ground. Do not arch your back. Keep your balance carefully, and do not hold your breath. Hold this position for __________ seconds. Slowly return to the starting position. Repeat with your right leg and your left arm. Repeat __________ times. Complete this exercise __________ times a day. Abdominal set with straight leg  raise  Lie on your back on a firm bed or the floor. Bend one of your knees and keep your other leg straight. Tense your abdominal muscles and lift your straight leg up, 4-6 inches (10-15 cm) off the ground. Keep your abdominal muscles tight and hold this position for __________ seconds. Do not hold your breath. Do not arch your back. Keep it flat against the ground. Keep your abdominal muscles tense as you slowly lower your leg back to the starting position. Repeat with your other leg. Repeat __________ times. Complete this exercise  __________ times a day. Single leg lower with bent knees Lie on your back on a firm bed or the floor. Tense your abdominal muscles and lift your feet off the floor, one foot at a time, so your knees and hips are bent in 90-degree angles (right angles). Your knees should be over your hips and your lower legs should be parallel to the floor. Keeping your abdominal muscles tense and your knee bent, slowly lower one of your legs so your toe touches the ground. Lift your leg back up to return to the starting position. Do not hold your breath. Do not let your back arch. Keep your back flat against the ground. Repeat with your other leg. Repeat __________ times. Complete this exercise __________ times a day. Posture and body mechanics Good posture and healthy body mechanics can help to relieve stress in your body's tissues and joints. Body mechanics refers to the movements and positions of your body while you do your daily activities. Posture is part of body mechanics. Good posture means: Your spine is in its natural S-curve position (neutral). Your shoulders are pulled back slightly. Your head is not tipped forward (neutral). Follow these guidelines to improve your posture and body mechanics in your everyday activities. Standing  When standing, keep your spine neutral and your feet about hip-width apart. Keep a slight bend in your knees. Your ears, shoulders, and  hips should line up. When you do a task in which you stand in one place for a long time, place one foot up on a stable object that is 2-4 inches (5-10 cm) high, such as a footstool. This helps keep your spine neutral. Sitting  When sitting, keep your spine neutral and keep your feet flat on the floor. Use a footrest, if necessary, and keep your thighs parallel to the floor. Avoid rounding your shoulders, and avoid tilting your head forward. When working at a desk or a computer, keep your desk at a height where your hands are slightly lower than your elbows. Slide your chair under your desk so you are close enough to maintain good posture. When working at a computer, place your monitor at a height where you are looking straight ahead and you do not have to tilt your head forward or downward to look at the screen. Resting When lying down and resting, avoid positions that are most painful for you. If you have pain with activities such as sitting, bending, stooping, or squatting, lie in a position in which your body does not bend very much. For example, avoid curling up on your side with your arms and knees near your chest (fetal position). If you have pain with activities such as standing for a long time or reaching with your arms, lie with your spine in a neutral position and bend your knees slightly. Try the following positions: Lying on your side with a pillow between your knees. Lying on your back with a pillow under your knees. Lifting  When lifting objects, keep your feet at least shoulder-width apart and tighten your abdominal muscles. Bend your knees and hips and keep your spine neutral. It is important to lift using the strength of your legs, not your back. Do not lock your knees straight out. Always ask for help to lift heavy or awkward objects. This information is not intended to replace advice given to you by your health care provider. Make sure you discuss any questions you have with your  health care provider. Document Revised: 09/19/2022  Document Reviewed: 08/03/2020 [employer] Patient Education  2024 [employer].

## 2023-08-11 ENCOUNTER — Encounter: Payer: Self-pay | Admitting: Family Medicine

## 2023-08-11 ENCOUNTER — Telehealth: Admitting: Family Medicine

## 2023-08-11 VITALS — Temp 98.0°F | Wt 151.0 lb

## 2023-08-11 DIAGNOSIS — U071 COVID-19: Secondary | ICD-10-CM

## 2023-08-11 DIAGNOSIS — J4521 Mild intermittent asthma with (acute) exacerbation: Secondary | ICD-10-CM

## 2023-08-11 DIAGNOSIS — R051 Acute cough: Secondary | ICD-10-CM | POA: Diagnosis not present

## 2023-08-11 MED ORDER — ALBUTEROL SULFATE HFA 108 (90 BASE) MCG/ACT IN AERS: 2.0000 | INHALATION_SPRAY | Freq: Four times a day (QID) | RESPIRATORY_TRACT | 0 refills | Status: AC | PRN

## 2023-08-11 MED ORDER — PREDNISONE 20 MG PO TABS: 40.0000 mg | ORAL_TABLET | Freq: Every day | ORAL | 0 refills | Status: AC

## 2023-08-11 MED ORDER — BENZONATATE 100 MG PO CAPS: 100.0000 mg | ORAL_CAPSULE | Freq: Two times a day (BID) | ORAL | 0 refills | Status: AC | PRN

## 2023-08-11 MED ORDER — NIRMATRELVIR/RITONAVIR (PAXLOVID)TABLET: 3.0000 | ORAL_TABLET | Freq: Two times a day (BID) | ORAL | 0 refills | Status: AC

## 2023-08-11 NOTE — Progress Notes (Signed)
 Virtual Visit via Video Note I connected with Shelley Figueroa on 08/11/2023 by a video enabled telemedicine application and verified that I am speaking with the correct person using two identifiers. Location patient: home Location provider:work office Persons participating in the virtual visit: patient, provider, scribe  I discussed the limitations of evaluation and management by telemedicine and the availability of in person appointments. The patient expressed understanding and agreed to proceed.  Chief Complaint  Patient presents with   Covid Positive    Nasal and chest congestion,  productive cough with yellow phlegm, body aches, sore scratchy throat. Symptoms started 2 days ago. Has been taking calcium, vit d, vit K.    HPI: Ms. Shelley Figueroa is a 48 y.o. 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  She complains of chills, body aches, nasal congestion, productive cough, sore throat, sneezing,and some headache, starting on the night of 3/12.  Intermittent wheezing, negative for CP and dyspnea. She has been taking guaifenesin, aleve, tylenol, and cough drops for her symptoms.  Asthma: She used her albuterol inhaler for the wheezing.  Also on Flovent 44 mcg 2 puffs twice daily.  Patient states her husband went to UC this morning and tested positive for Flu A and negative for Covid. She then took a home test that was positive for Covid but negative for Flu A.  Pertinent negatives include fever, hemoptysis, abdominal pain, nausea, vomiting, urinary symptoms, or a skin rash.   States that she took prednisone from 2/12-2/17 to reduce swelling associated with wrist fracture before surgery.   She sees rheumatology regularly for OA.   ROS: See pertinent positives and negatives per HPI.  Past Medical History:  Diagnosis Date    Allergic rhinitis due to pollen    Anemia    with pregnancy   Anxiety    Arthritis    Asthma    Chronic cystitis    COVID-19 virus infection 05/12/2021   Depression    Dyspareunia in female    Dyspnea on exertion    per pt hard to recover after exercise (cardiologist has echo and cardiac CT ordered when schedule is opened up)   Elevated LFTs 08/16/2022   resolved   Essential hypertension    cardioloigst-  dr t. [facility]-- FH premature CAD--- 08-18-2014 normal stress echo   Foot pain 02/15/2022   History of chronic bronchitis    History of HPV infection    remote   History of kidney stones    History of recurrent UTIs    OA (osteoarthritis)    b. hands and b. feet   Pure hypercholesterolemia 09/20/2018   Renal calculus, left    per pt non-obstructive    Past Surgical History:  Procedure Laterality Date   CESAREAN SECTION  2004, 2005   CYSTOSCOPY N/A 11/06/2018   Procedure: CYSTOSCOPY;  Surgeon: Jerene Bears, MD;  Location: [facility];  Service: Gynecology;  Laterality: N/A;   HYSTEROSCOPY WITH D & C N/A 04/27/2022   Procedure: DILATATION AND CURETTAGE /HYSTEROSCOPY/MYOSURE POLYP RESECTION;  Surgeon: Jerene Bears, MD;  Location: [facility];  Service: Gynecology;  Laterality: N/A;   INTRAUTERINE DEVICE (IUD) INSERTION N/A 11/06/2018   Procedure: INTRAUTERINE DEVICE (IUD) INSERTION WITH ULTRASOUND GUIDANCE;  Surgeon: Jerene Bears, MD;  Location: [facility];  Service: Gynecology;  Laterality: N/A;   INTRAUTERINE DEVICE (IUD) INSERTION N/A 04/27/2022   Procedure: INTRAUTERINE DEVICE (IUD) INSERTION;  Surgeon: Jerene Bears, MD;  Location: [facility];  Service: Gynecology;  Laterality: N/A;   IUD REMOVAL N/A 11/06/2018   Procedure: INTRAUTERINE DEVICE (IUD) REMOVAL;  Surgeon: Jerene Bears, MD;  Location: [facility];  Service: Gynecology;  Laterality: N/A;  Mirena IUD removal and reinsertion with  ultrasound guidance.   IUD REMOVAL N/A 04/27/2022   Procedure: INTRAUTERINE DEVICE (IUD) REMOVAL;  Surgeon: Jerene Bears, MD;  Location: [facility];  Service: Gynecology;  Laterality: N/A;   KNEE ARTHROSCOPY Right 1995   with left knee realignment   KNEE SURGERY Left 1991   NASAL SINUS SURGERY  2015   wtih septoplasty   ORIF WRIST FRACTURE Right 07/18/2023   Procedure: OPEN REDUCTION INTERNAL FIXATION (ORIF) WRIST FRACTURE;  Surgeon: Sheral Apley, MD;  Location: [facility];  Service: Orthopedics;  Laterality: Right;   SEPTOPLASTY     TONSILLECTOMY  1987    Family History  Problem Relation Age of Onset   Hypertension Mother    Hyperlipidemia Mother    Rheum arthritis Mother    Sjogren's syndrome Mother    Hypertension Father    Hyperlipidemia Father    Coronary artery disease Father        MI 19s, smoked, CABG, MULTIPLE STENTS   Kidney failure Father    Bipolar disorder Sister    Alcohol abuse Sister    Depression Sister    Rheum arthritis Sister    Obesity Sister    Kidney Stones Sister    Parkinson's disease Maternal Grandmother    Colon cancer Maternal Grandfather    Alzheimer's disease Paternal Grandmother    Tics Son    Anxiety disorder Son    ADD / ADHD Son    Anxiety disorder Son    Asthma Son    Liver disease Neg Hx    Esophageal cancer Neg Hx     Social History   Socioeconomic History   Marital status: Married    Spouse name: Shelley Figueroa   Number of children: 2   Years of education: Not on file   Highest education level: Bachelor's degree (e.g., BA, AB, BS)  Occupational History   Occupation: Executive    Comment: [employer]  Tobacco Use   Smoking status: Never    Passive exposure: Past   Smokeless tobacco: Never  Vaping Use   Vaping status: Never Used  Substance and Sexual Activity   Alcohol use: Yes    Alcohol/week: 0.0 - 1.0 standard drinks of alcohol    Comment: rarely   Drug use: Never   Sexual activity: Yes    Partners: Male     Birth control/protection: I.U.D.    Comment: Mirena placed 11/06/18  Other Topics Concern   Not on file  Social History Narrative   From [place] originally   [employer], Class of [employer]   Married to Izabela Ow   2 children   Social Drivers of Health   Financial Resource Strain: Low Risk  (02/07/2023)   Overall Financial Resource Strain (CARDIA)    Difficulty of Paying Living Expenses: Not hard at all  Food Insecurity: No Food Insecurity (02/07/2023)   Hunger Vital Sign    Worried About Running Out of Food in the Last Year: Never true    Ran Out of Food in the Last Year: Never true  Transportation Needs: No Transportation  Needs (02/07/2023)   PRAPARE - Administrator, Civil Service (Medical): No    Lack of Transportation (Non-Medical): No  Physical Activity: Sufficiently Active (02/07/2023)   Exercise Vital Sign    Days of Exercise per Week: 5 days    Minutes of Exercise per Session: 30 min  Stress: Stress Concern Present (02/07/2023)   [employer] of Occupational Health - Occupational Stress Questionnaire    Feeling of Stress : Very much  Social Connections: Moderately Isolated (02/07/2023)   Social Connection and Isolation Panel [NHANES]    Frequency of Communication with Friends and Family: More than three times a week    Frequency of Social Gatherings with Friends and Family: Twice a week    Attends Religious Services: Never    Database administrator or Organizations: No    Attends Engineer, structural: Not on file    Marital Status: Married  Catering manager Violence: Not on file     Current Outpatient Medications:    ALPRAZolam (XANAX) 0.25 MG tablet, Take 0.5-2 tablets (0.125-0.5 mg total) by mouth 2 (two) times daily as needed for anxiety. (Patient taking differently: Take 0.125 mg by mouth 2 (two) times daily.), Disp: 180 tablet, Rfl: 1   aspirin EC 81 MG tablet, Take 1 tablet (81 mg total) by mouth 2 (two) times daily. To prevent  blood clots for 30 days after surgery., Disp: 60 tablet, Rfl: 0   azelastine (ASTELIN) 0.1 % nasal spray, Place 1 spray into both nostrils in the morning. Use in each nostril as directed, Disp: , Rfl:    benzonatate (TESSALON) 100 MG capsule, Take 1 capsule (100 mg total) by mouth 2 (two) times daily as needed for cough., Disp: 20 capsule, Rfl: 0   buPROPion (WELLBUTRIN XL) 300 MG 24 hr tablet, TAKE 1 TABLET BY MOUTH EVERY DAY, Disp: 90 tablet, Rfl: 0   cetirizine (ZYRTEC) 10 MG tablet, Take 10 mg by mouth at bedtime., Disp: , Rfl:    estradiol (ESTRACE) 0.1 MG/GM vaginal cream, Place 1 g vaginally 3 (three) times a week. 1 gram vaginally twice weekly, Disp: 42.5 g, Rfl: 3   Eszopiclone 3 MG TABS, Take 1 tablet (3 mg total) by mouth at bedtime as needed. Take immediately before bedtime (Patient taking differently: Take 3 mg by mouth at bedtime. Take immediately before bedtime), Disp: 90 tablet, Rfl: 1   FLOVENT HFA 44 MCG/ACT inhaler, Inhale 2 puffs into the lungs 2 (two) times daily., Disp: , Rfl:    fluticasone (FLONASE SENSIMIST) 27.5 MCG/SPRAY nasal spray, Place 1 spray into the nose daily., Disp: , Rfl:    naproxen sodium (ALEVE) 220 MG tablet, Take 220 mg by mouth daily as needed (Pain)., Disp: , Rfl:    nirmatrelvir/ritonavir (PAXLOVID) 20 x 150 MG & 10 x 100MG  TABS, Take 3 tablets by mouth 2 (two) times daily for 5 days. (Take nirmatrelvir 150 mg two tablets twice daily for 5 days and ritonavir 100 mg one tablet twice daily for 5 days) Patient GFR is >60 in 07/2023., Disp: 30 tablet, Rfl: 0   predniSONE (DELTASONE) 20 MG tablet, Take 2 tablets (40 mg total) by mouth daily with breakfast for 5 days., Disp: 10 tablet, Rfl: 0   Spacer/Aero-Holding Chambers (OPTICHAMBER DIAMOND) MISC, See admin instructions., Disp: , Rfl:    valsartan-hydrochlorothiazide (DIOVAN-HCT) 160-25 MG tablet, TAKE 1 TABLET BY MOUTH EVERY DAY, Disp: 90 tablet, Rfl: 1   albuterol (VENTOLIN HFA) 108 (90 Base) MCG/ACT inhaler,  Inhale 2 puffs into the lungs every 6 (six) hours as needed for wheezing or shortness of breath., Disp: 18 g, Rfl: 0   EPINEPHrine 0.3 mg/0.3 mL IJ SOAJ injection, Inject 0.3 mg into the muscle as needed for anaphylaxis. (Patient not taking: Reported on 08/11/2023), Disp: , Rfl:    L-Lysine HCl 1000 MG TABS, Take 1,000 mg by mouth daily as needed (Mouth sores). (Patient not taking: Reported on 08/11/2023), Disp: , Rfl:    valACYclovir (VALTREX) 500 MG tablet, Take 1,000 mg by mouth 3 (three) times daily as needed.  (Patient not taking: Reported on 08/11/2023), Disp: , Rfl:   EXAM:  VITALS per patient if applicable:Temp 98 F (36.7 C)   Wt 151 lb (68.5 kg)   BMI 24.37 kg/m   GENERAL: alert, oriented, appears well and in no acute distress  HEENT: atraumatic, conjunctiva clear, no obvious abnormalities on inspection of external nose and ears  NECK: normal movements of the head and neck  LUNGS: on inspection no signs of respiratory distress, breathing rate appears normal, no obvious gross SOB, gasping or wheezing  CV: no obvious cyanosis  MS: moves all visible extremities without noticeable abnormality  PSYCH/NEURO: pleasant and cooperative, no obvious depression or anxiety, speech and thought processing grossly intact  ASSESSMENT AND PLAN:  Discussed the following assessment and plan:  Mild intermittent asthma with exacerbation Occasional wheezing. Recommend albuterol inhaler 2 puff every 6 hours for a week then continue as needed. Continue Flovent 44 mcg 2 puff twice daily. Prednisone 40 mg with breakfast for 3 to 5 days recommended.  -     predniSONE; Take 2 tablets (40 mg total) by mouth daily with breakfast for 5 days.  Dispense: 10 tablet; Refill: 0 -     Albuterol Sulfate HFA; Inhale 2 puffs into the lungs every 6 (six) hours as needed for wheezing or shortness of breath.  Dispense: 18 g; Refill: 0  COVID-19 virus infection We discussed Dx,possible complications and treatment  options. She has a mild to moderate case with risk for complications. We discussed oral antiviral options and side effects. She agrees with starting Paxlovid. Symptomatic treatment with plenty of fluids,rest,tylenol 500 mg 3-4 times per day prn. Throat lozenges if needed for sore throat. Explained that cough and congestion may last a few more days and even weeks after acute symptoms have resolved. Instructed about warning signs.  -     nirmatrelvir/ritonavir; Take 3 tablets by mouth 2 (two) times daily for 5 days. (Take nirmatrelvir 150 mg two tablets twice daily for 5 days and ritonavir 100 mg one tablet twice daily for 5 days) Patient GFR is >60 in 07/2023.  Dispense: 30 tablet; Refill: 0  Acute cough Benzonatate has helped in the past for cough, prescription sent. Continue adequate hydration. OTC plain Mucinex may also help.  -     Benzonatate; Take 1 capsule (100 mg total) by mouth 2 (two) times daily as needed for cough.  Dispense: 20 capsule; Refill: 0   We discussed possible serious and likely etiologies, options for evaluation and workup, limitations of telemedicine visit vs in person visit, treatment, treatment risks and precautions. The patient was advised to call back or seek an in-person evaluation if the symptoms worsen or if the condition fails to improve as anticipated. I discussed the assessment and treatment plan with the patient. The patient was provided an opportunity to ask questions and all were answered. The patient agreed with the plan and demonstrated an understanding of the  instructions.  No follow-ups on file.  I, Rolla Etienne Wierda, acting as a scribe for Kiyo Heal [place], MD., have documented all relevant documentation on the behalf of Trinidee Schrag [place], MD, as directed by  Azahel Belcastro [place], MD while in the presence of Ansley Stanwood [place], MD.   I, Hasna Stefanik [place], MD, have reviewed all documentation for this visit. The documentation on 08/11/23 for the exam, diagnosis, procedures, and  orders are all accurate and complete.  Willies Laviolette [place], MD

## 2023-08-14 NOTE — Therapy (Signed)
 OUTPATIENT OCCUPATIONAL THERAPY TREATMENT NOTE  Patient Name: Shelley Figueroa MRN: [id number] DOB:01-26-1976, 48 y.o., female Today's Date: 08/15/2023  PCP: Allayne Gitelman NP  REFERRING PROVIDER: Margarita Rana, MD   END OF SESSION:  OT End of Session - 08/15/23 0857     Visit Number 3    Number of Visits 10    Date for OT Re-Evaluation 09/15/23    Authorization Type [employer]    OT Start Time 0857    OT Stop Time 0937    OT Time Calculation (min) 40 min    Equipment Utilized During Treatment orthotic materials    Activity Tolerance Patient tolerated treatment well;No increased pain;Patient limited by fatigue;Patient limited by pain    Behavior During Therapy [facility] for tasks assessed/performed             Past Medical History:  Diagnosis Date   Allergic rhinitis due to pollen    Anemia    with pregnancy   Anxiety    Arthritis    Asthma    Chronic cystitis    COVID-19 virus infection 05/12/2021   Depression    Dyspareunia in female    Dyspnea on exertion    per pt hard to recover after exercise (cardiologist has echo and cardiac CT ordered when schedule is opened up)   Elevated LFTs 08/16/2022   resolved   Essential hypertension    cardioloigst-  dr t. [facility]-- FH premature CAD--- 08-18-2014 normal stress echo   Foot pain 02/15/2022   History of chronic bronchitis    History of HPV infection    remote   History of kidney stones    History of recurrent UTIs    OA (osteoarthritis)    b. hands and b. feet   Pure hypercholesterolemia 09/20/2018   Renal calculus, left    per pt non-obstructive   Past Surgical History:  Procedure Laterality Date   CESAREAN SECTION  2004, 2005   CYSTOSCOPY N/A 11/06/2018   Procedure: CYSTOSCOPY;  Surgeon: Jerene Bears, MD;  Location: [facility];  Service: Gynecology;  Laterality: N/A;   HYSTEROSCOPY WITH D & C N/A 04/27/2022   Procedure: DILATATION AND CURETTAGE /HYSTEROSCOPY/MYOSURE POLYP RESECTION;  Surgeon:  Jerene Bears, MD;  Location: [facility];  Service: Gynecology;  Laterality: N/A;   INTRAUTERINE DEVICE (IUD) INSERTION N/A 11/06/2018   Procedure: INTRAUTERINE DEVICE (IUD) INSERTION WITH ULTRASOUND GUIDANCE;  Surgeon: Jerene Bears, MD;  Location: [facility];  Service: Gynecology;  Laterality: N/A;   INTRAUTERINE DEVICE (IUD) INSERTION N/A 04/27/2022   Procedure: INTRAUTERINE DEVICE (IUD) INSERTION;  Surgeon: Jerene Bears, MD;  Location: [facility];  Service: Gynecology;  Laterality: N/A;   IUD REMOVAL N/A 11/06/2018   Procedure: INTRAUTERINE DEVICE (IUD) REMOVAL;  Surgeon: Jerene Bears, MD;  Location: [facility];  Service: Gynecology;  Laterality: N/A;  Mirena IUD removal and reinsertion with ultrasound guidance.   IUD REMOVAL N/A 04/27/2022   Procedure: INTRAUTERINE DEVICE (IUD) REMOVAL;  Surgeon: Jerene Bears, MD;  Location: [facility];  Service: Gynecology;  Laterality: N/A;   KNEE ARTHROSCOPY Right 1995   with left knee realignment   KNEE SURGERY Left 1991   NASAL SINUS SURGERY  2015   wtih septoplasty   ORIF WRIST FRACTURE Right 07/18/2023   Procedure: OPEN REDUCTION INTERNAL FIXATION (ORIF) WRIST FRACTURE;  Surgeon: Sheral Apley, MD;  Location: [facility];  Service: Orthopedics;  Laterality: Right;  SEPTOPLASTY     TONSILLECTOMY  1987   Patient Active Problem List   Diagnosis Date Noted   Closed fracture of right distal radius 07/18/2023   Elevated LFTs 08/16/2022   Encounter for removal of intrauterine contraceptive device (IUD) 04/27/2022   Foot pain 02/15/2022   Joint pain in both hands 01/29/2021   Family history of coronary artery disease in father 07/30/2019   Contrast media allergy 07/30/2019   Insomnia 06/15/2018   Hyperlipidemia 04/06/2018   Encounter for annual general medical examination with abnormal findings in adult 12/28/2016   Asthma, chronic 05/28/2014   Essential  hypertension 05/28/2014   ADJ DISORDER WITH MIXED ANXIETY & DEPRESSED MOOD 07/15/2009   Allergic rhinitis 07/06/2009    ONSET DATE: DOS: 07/18/23, DOI 07/12/23  REFERRING DIAG:  Z61.096,E45.409 (ICD-10-CM) - Joint pain in both hands    THERAPY DIAG:  Localized edema  Muscle weakness (generalized)  Pain in right wrist  Stiffness of right wrist, not elsewhere classified  Stiffness of right hand, not elsewhere classified  Pain in left hand  Other lack of coordination  Pain in right hand  Stiffness of left hand, not elsewhere classified  Rationale for Evaluation and Treatment: Rehabilitation  PERTINENT HISTORY: New fall and distal radius fracture with subsequent surgery  07/18/23. This is being evaluated in OT 08/02/23.   Was previously being seen in OT for hand arthritis and that info is as follows (that POC will be put on hold): Finger fracture in 05/12/2023, chronic arthritis, plantar fasciitis She states she broke her Lt IF on 05/12/23, and it went untreated and has a nonhealing fracture for which she just started wearing a prefabricated metal anger splint.  She also has chronic bil hand pain R> L, primarily in the base of her thumbs as well as her tip joints of her fingers with some overt Heberden's nodes. She states neg for auto-immune issues.  Also here for PT eval for plantar fascitis.   PRECAUTIONS: None; RED FLAGS: None   WEIGHT BEARING RESTRICTIONS: Yes less than 5 pounds in left hand recommended for the next month and nothing painful   SUBJECTIVE:   SUBJECTIVE STATEMENT: Now approx 4 weeks post-op Rt DRF and ORIF and 5 weeks immobilized Lt IF fx. She states her left hand is feeling great and much more mobile and less pain, the right wrist is somewhat sore but she feels like she is doing better with her mobility.Marland Kitchen    PAIN:  Are you having pain?  Yes: NPRS scale: constant pain 2-3/10 on average depending on activity.  Pain location: Rt wrist/thumb Pain description:  Aching Aggravating factors: Repetitive or heavy gripping Relieving factors: Rest and heat   PATIENT GOALS: To improve pain in both of her hands and also adequately heal her finger fracture  NEXT MD VISIT: 08/10/2023    OBJECTIVE: (All objective assessments below are from updated evaluation on: 08/02/23 unless otherwise specified.)   HAND DOMINANCE: Right   ADLs: Overall ADLs: States decreased ability to grab, hold household objects, pain and difficulty to open containers, perform FMS tasks (manipulate fasteners on clothing)   FUNCTIONAL OUTCOME MEASURES: 08/02/23: Quick DASH: 72% issues now after DRF   07/11/23: Quick DASH 27% impairment today  (Higher % Score  =  More Impairment)     UPPER EXTREMITY ROM     Shoulder to Wrist AROM Right 07/11/23 Left 07/11/23 Right  08/02/23 Rt 08/09/23 Rt 08/15/23  Forearm supination 87 88 56 72   Forearm pronation  80  80 74 84   Wrist flexion 71 76 21 28 39  Wrist extension 73 77 26 29 45  Rad dev    19 22  Ulnar dev    28 25  (Blank rows = not tested)   Hand AROM Right 07/11/23 Left 07/11/23 Rt 08/02/23 Rt 08/09/23   Full Fist Ability (or Gap to Distal Palmar Crease) Full fist but IF is tight  Unable with left hand due to finger fracture Fingers barely touch palm today Full fist full  Thumb Opposition  (Kapandji Scale)  9.5/10 10/10 4/10 8/10 8/10  Thumb MCP J    0-46  0-64  Thumb IP J    0-2  0-27  Radial Abduction   34*  36*  (Blank rows = not tested)   UPPER EXTREMITY MMT:    Eval:  NT at eval due to recent and still healing injuries. Will be tested when appropriate.   MMT Right TBD Left TBD  Shoulder flexion    Shoulder abduction    Shoulder adduction    Shoulder extension    Shoulder internal rotation    Shoulder external rotation    Middle trapezius    Lower trapezius    Elbow flexion    Elbow extension    Forearm supination    Forearm pronation    Wrist flexion    Wrist extension    Wrist ulnar deviation    Wrist  radial deviation    (Blank rows = not tested)  HAND FUNCTION: Eval 08/02/23: not tested due to healing fxs   07/11/23:  Observed weakness in affected bil hands.  Grip strength Right: 53.6 lbs tender, Left: NT lbs   COORDINATION: 08/02/23 Eval: Observed coordination impairments with affected bil hands. TBD baseline measures.  9 Hole Peg Test Right: TBDsec, Left: TBD sec (TBD sec is WFL)   EDEMA:   08/02/23: Mildly swollen in right distal radius compared to left- 16.8cm right wrist vs 15cm left wrist  OBSERVATIONS:   08/02/23 Eval: Right wrist surgical area has some bruising and ecchymosis, otherwise looks clean and covered by Steri-Strips.  Left index finger distal phalanx largely nontender mild angulation, x-rays seem to show interval healing.   07/11/23 Eval: Some overt Heberden's nodes, tenderness about the CMC joints of the thumb, some overt stiffness especially with broken left index finger   TODAY'S TREATMENT:  08/15/23: She starts with active range of motion which shows significant improvements in wrist, forearm, thumb motion now.  Her home exercises were reviewed with her and upgraded with the bolded exercises below.  These were modifications to make stretches stronger and more effective now that she has better mobility.  Left index finger was also upgraded to tolerable stretches at DIP and in composite.  Additionally, we discussed weaning from the orthosis in the left hand for light functional activities more so now, and also gently with the right wrist and arm as well.  As her prefabricated wrist cock up brace continues to be somewhat painful for her-pressing on her radial and ulnar processes distally, which could potentially to pressure sores or problems with healing-OT custom fabricates wrist immobilization orthosis that is a much better fit, creates no pain or pressure, allows her to rest.    She states no significant pain and she leaves at the end of the session stating understanding and  tolerating upgraded stretches.   Exercises - Forearm Supination Stretch  - 3-4 x daily - 3-5 reps - 15 sec hold - Wrist Flexion  Stretch  - 4 x daily - 3-5 reps - 15 sec hold - Seated Wrist Extension Stretch  - 3-6 x daily - 3-5 reps - 15 hold - SPATULA stretch   - 2-4 x daily - 3 reps - 15 sec hold - HOOK Stretch  - 4 x daily - 3-5 reps - 15-20 sec hold - Seated Finger Composite Flexion Stretch  - 4 x daily - 3-5 reps - 15 hold - Thumb stretch  - 4 x daily - 3-5 reps - 15-20 sec hold - Turn [employer] Facing Up & Down  - 4-6 x daily - 5 reps - Bend and Pull Back Wrist SLOWLY  - 4 x daily - 5 reps - "Windshield Wipers"   - 4 x daily - 5 reps - Wrist AROM Dart Throwers Motion  - 4 x daily - 5 reps - Tendon Glides  - 4-6 x daily - 3-5 reps - 2-3 seconds hold   PATIENT EDUCATION: Education details: See tx section above for details  Person educated: Patient Education method: Engineer, structural, Teach back, Handouts  Education comprehension: States and demonstrates understanding, Additional Education required    HOME EXERCISE PROGRAM: Wrist Program:  Access Code: [email or web address] URL: [email or web address] Date: 08/02/2023 Prepared by: Fannie Knee  OA Program: Access Code: [email or web address] URL: [email or web address] Date: 07/11/2023 Prepared by: Fannie Knee   GOALS: Goals reviewed with patient? Yes   SHORT TERM GOALS: (STG required if POC>30 days) Target Date: 07/28/2023  Pt will obtain protective, custom orthotic. Goal status: 07/11/2023 MET   2.  Pt will demo/state understanding of initial HEP to improve pain levels and prerequisite motion. Goal status: 08/15/2023: Met   LONG TERM GOALS: Target Date: 09/08/2023  Pt will improve functional ability by decreased impairment per Quick DASH assessment from 27% to 15% or better, for better quality of life. Goal status: INITIAL  2.  Pt will improve grip strength in right hand from tender 53 lbs to at least  nontender 56 lbs for functional use at home and in IADLs. Goal status: INITIAL  3.  Pt will improve A/ROM in right hand full fist from loose fist with index finger extended to at least a tight full fist, to have functional motion for tasks like reach and grasp.  Goal status: INITIAL  4.  Pt will improve A/ROM in left hand index finger total active motion from limited range of motion due to broken DIP joint to at least 210 degrees total active motion, to have functional motion for tasks like reach and grasp.  Goal status: INITIAL  5.  Pt will improve coordination skills in bilateral hands, as seen by within functional limit score on nine-hole peg testing to have increased functional ability to carry out fine motor tasks (fasteners, etc.) and more complex, coordinated IADLs (meal prep, sports, etc.).  Goal status: INITIAL  6.  Pt will decrease pain at rest from 3-5/10 to 1-3/10 or better to have better sleep and occupational participation in daily roles. Goal status: INITIAL  ASSESSMENT:  CLINICAL IMPRESSION: 08/15/23: Doing very well with mobility and both extremities.  Pain relatively low, we will upgrade to manual therapy techniques like joint mobilizations, as well as continue weaning from orthoses for functional activities.  08/09/23: Right wrist and forearm greatly improving, left hand also doing better and now will start weaning from left index finger orthosis for active range of motion.  Carry on  08/02/23 Eval: Shelley Figueroa returns today with a new order for therapy evaluation for a  new right distal radius fracture status post ORIF.  As she was here semi-recently for evaluation of bilateral hand arthritis and left index finger fx (07/11/23), OT included those measurements and today's evaluation for baselines and will continue to monitor her for these problems as well.  She has new onset of stiffness, weakness, swelling, pain in the right wrist and arm which has further decreased her  functional ability.  She will benefit from outpatient occupational therapy now to start working on this new issue as well as to continue working on her other more chronic issues.   PLAN:  OT FREQUENCY: 1-2x/week  OT DURATION: 6 weeks through 09/15/2023 and up to 10 total visits as needed  PLANNED INTERVENTIONS: 97168 OT Re-evaluation, 97535 self care/ADL training, 16109 therapeutic exercise, 97530 therapeutic activity, 97112 neuromuscular re-education, 97140 manual therapy, 97035 ultrasound, 97039 fluidotherapy, 97010 moist heat, 97010 cryotherapy, 97760 Orthotics management and training, 60454 Splinting (initial encounter), [phone] Subsequent splinting/medication, Dry needling, energy conservation, coping strategies training, patient/family education, and DME and/or AE instructions   CONSULTED AND AGREED WITH PLAN OF CARE: Patient  PLAN FOR NEXT SESSION:   Try gentle joint mobilizations, work on light proprioceptive activities and light functional activities, consider therapy putty for left hand in 2 weeks and next week for her right hand.   Fannie Knee, OTR/L, CHT 08/15/2023, 11:18 AM

## 2023-08-15 ENCOUNTER — Encounter: Payer: Self-pay | Admitting: Rehabilitative and Restorative Service Providers"

## 2023-08-15 ENCOUNTER — Ambulatory Visit
Admission: RE | Admit: 2023-08-15 | Discharge: 2023-08-15 | Disposition: A | Discharge status: Home / Self Care | Payer: 59 | Source: Ambulatory Visit | Attending: Obstetrics & Gynecology | Admitting: Obstetrics & Gynecology

## 2023-08-15 ENCOUNTER — Ambulatory Visit (INDEPENDENT_AMBULATORY_CARE_PROVIDER_SITE_OTHER): Payer: 59 | Admitting: Rehabilitative and Restorative Service Providers"

## 2023-08-15 DIAGNOSIS — M25531 Pain in right wrist: Secondary | ICD-10-CM | POA: Diagnosis not present

## 2023-08-15 DIAGNOSIS — M25631 Stiffness of right wrist, not elsewhere classified: Secondary | ICD-10-CM | POA: Diagnosis not present

## 2023-08-15 DIAGNOSIS — R600 Localized edema: Secondary | ICD-10-CM | POA: Diagnosis not present

## 2023-08-15 DIAGNOSIS — M79642 Pain in left hand: Secondary | ICD-10-CM

## 2023-08-15 DIAGNOSIS — M25641 Stiffness of right hand, not elsewhere classified: Secondary | ICD-10-CM

## 2023-08-15 DIAGNOSIS — M6281 Muscle weakness (generalized): Secondary | ICD-10-CM | POA: Diagnosis not present

## 2023-08-15 DIAGNOSIS — M25642 Stiffness of left hand, not elsewhere classified: Secondary | ICD-10-CM

## 2023-08-15 DIAGNOSIS — Z1231 Encounter for screening mammogram for malignant neoplasm of breast: Secondary | ICD-10-CM

## 2023-08-15 DIAGNOSIS — R278 Other lack of coordination: Secondary | ICD-10-CM

## 2023-08-15 DIAGNOSIS — M79641 Pain in right hand: Secondary | ICD-10-CM

## 2023-08-18 ENCOUNTER — Other Ambulatory Visit: Payer: Self-pay | Admitting: Obstetrics & Gynecology

## 2023-08-18 DIAGNOSIS — R928 Other abnormal and inconclusive findings on diagnostic imaging of breast: Secondary | ICD-10-CM

## 2023-08-21 NOTE — Therapy (Signed)
 OUTPATIENT OCCUPATIONAL THERAPY TREATMENT NOTE  Patient Name: Shelley Figueroa MRN: [id number] DOB:November 07, 1975, 48 y.o., female Today's Date: 08/22/2023  PCP: Allayne Gitelman NP  REFERRING PROVIDER: Margarita Rana, MD   END OF SESSION:  OT End of Session - 08/22/23 1300     Visit Number 4    Number of Visits 10    Date for OT Re-Evaluation 09/15/23    Authorization Type [employer]    OT Start Time 1300    OT Stop Time 1348    OT Time Calculation (min) 48 min    Equipment Utilized During Treatment yellow putty    Activity Tolerance Patient tolerated treatment well;No increased pain;Patient limited by fatigue;Patient limited by pain    Behavior During Therapy [facility] for tasks assessed/performed              Past Medical History:  Diagnosis Date   Allergic rhinitis due to pollen    Anemia    with pregnancy   Anxiety    Arthritis    Asthma    Chronic cystitis    COVID-19 virus infection 05/12/2021   Depression    Dyspareunia in female    Dyspnea on exertion    per pt hard to recover after exercise (cardiologist has echo and cardiac CT ordered when schedule is opened up)   Elevated LFTs 08/16/2022   resolved   Essential hypertension    cardioloigst-  dr t. [facility]-- FH premature CAD--- 08-18-2014 normal stress echo   Foot pain 02/15/2022   History of chronic bronchitis    History of HPV infection    remote   History of kidney stones    History of recurrent UTIs    OA (osteoarthritis)    b. hands and b. feet   Pure hypercholesterolemia 09/20/2018   Renal calculus, left    per pt non-obstructive   Past Surgical History:  Procedure Laterality Date   CESAREAN SECTION  2004, 2005   CYSTOSCOPY N/A 11/06/2018   Procedure: CYSTOSCOPY;  Surgeon: Jerene Bears, MD;  Location: [facility];  Service: Gynecology;  Laterality: N/A;   HYSTEROSCOPY WITH D & C N/A 04/27/2022   Procedure: DILATATION AND CURETTAGE /HYSTEROSCOPY/MYOSURE POLYP RESECTION;  Surgeon: Jerene Bears, MD;  Location: [facility];  Service: Gynecology;  Laterality: N/A;   INTRAUTERINE DEVICE (IUD) INSERTION N/A 11/06/2018   Procedure: INTRAUTERINE DEVICE (IUD) INSERTION WITH ULTRASOUND GUIDANCE;  Surgeon: Jerene Bears, MD;  Location: [facility] [place];  Service: Gynecology;  Laterality: N/A;   INTRAUTERINE DEVICE (IUD) INSERTION N/A 04/27/2022   Procedure: INTRAUTERINE DEVICE (IUD) INSERTION;  Surgeon: Jerene Bears, MD;  Location: [facility];  Service: Gynecology;  Laterality: N/A;   IUD REMOVAL N/A 11/06/2018   Procedure: INTRAUTERINE DEVICE (IUD) REMOVAL;  Surgeon: Jerene Bears, MD;  Location: [facility];  Service: Gynecology;  Laterality: N/A;  Mirena IUD removal and reinsertion with ultrasound guidance.   IUD REMOVAL N/A 04/27/2022   Procedure: INTRAUTERINE DEVICE (IUD) REMOVAL;  Surgeon: Jerene Bears, MD;  Location: [facility];  Service: Gynecology;  Laterality: N/A;   KNEE ARTHROSCOPY Right 1995   with left knee realignment   KNEE SURGERY Left 1991   NASAL SINUS SURGERY  2015   wtih septoplasty   ORIF WRIST FRACTURE Right 07/18/2023   Procedure: OPEN REDUCTION INTERNAL FIXATION (ORIF) WRIST FRACTURE;  Surgeon: Sheral Apley, MD;  Location: [facility];  Service: Orthopedics;  Laterality: Right;  SEPTOPLASTY     TONSILLECTOMY  1987   Patient Active Problem List   Diagnosis Date Noted   Closed fracture of right distal radius 07/18/2023   Elevated LFTs 08/16/2022   Encounter for removal of intrauterine contraceptive device (IUD) 04/27/2022   Foot pain 02/15/2022   Joint pain in both hands 01/29/2021   Family history of coronary artery disease in father 07/30/2019   Contrast media allergy 07/30/2019   Insomnia 06/15/2018   Hyperlipidemia 04/06/2018   Encounter for annual general medical examination with abnormal findings in adult 12/28/2016   Asthma, chronic 05/28/2014   Essential hypertension  05/28/2014   ADJ DISORDER WITH MIXED ANXIETY & DEPRESSED MOOD 07/15/2009   Allergic rhinitis 07/06/2009    ONSET DATE: DOS: 07/18/23, DOI 07/12/23  REFERRING DIAG:  W09.811,B14.782 (ICD-10-CM) - Joint pain in both hands    THERAPY DIAG:  Localized edema  Muscle weakness (generalized)  Pain in right wrist  Stiffness of right wrist, not elsewhere classified  Stiffness of right hand, not elsewhere classified  Pain in left hand  Other lack of coordination  Pain in right hand  Stiffness of left hand, not elsewhere classified  Rationale for Evaluation and Treatment: Rehabilitation  PERTINENT HISTORY: New fall and distal radius fracture with subsequent surgery  07/18/23. This is being evaluated in OT 08/02/23.   Was previously being seen in OT for hand arthritis and that info is as follows (that POC will be put on hold): Finger fracture in 05/12/2023, chronic arthritis, plantar fasciitis She states she broke her Lt IF on 05/12/23, and it went untreated and has a nonhealing fracture for which she just started wearing a prefabricated metal anger splint.  She also has chronic bil hand pain R> L, primarily in the base of her thumbs as well as her tip joints of her fingers with some overt Heberden's nodes. She states neg for auto-immune issues.  Also here for PT eval for plantar fascitis.   PRECAUTIONS: None; RED FLAGS: None   WEIGHT BEARING RESTRICTIONS: Yes less than 5 pounds in left hand recommended for the next month and nothing painful   SUBJECTIVE:   SUBJECTIVE STATEMENT: Now approx 5 weeks post-op Rt DRF and ORIF and 6 weeks immobilized Lt IF fx. She states her orthosis and brace continue to rub and irritate. She also states arthritis is more painful than wrist or Lt IF.    PAIN:  Are you having pain?   Yes: NPRS scale: constant pain 1/10 on average depending on activity.  Pain location: Rt wrist/thumb Pain description: Aching Aggravating factors: Repetitive or heavy  gripping Relieving factors: Rest and heat   PATIENT GOALS: To improve pain in both of her hands and also adequately heal her finger fracture  NEXT MD VISIT: 08/10/2023    OBJECTIVE: (All objective assessments below are from updated evaluation on: 08/02/23 unless otherwise specified.)   HAND DOMINANCE: Right   ADLs: Overall ADLs: States decreased ability to grab, hold household objects, pain and difficulty to open containers, perform FMS tasks (manipulate fasteners on clothing)   FUNCTIONAL OUTCOME MEASURES: 08/02/23: Quick DASH: 72% issues now after DRF   07/11/23: Quick DASH 27% impairment today  (Higher % Score  =  More Impairment)     UPPER EXTREMITY ROM     Shoulder to Wrist AROM Right 07/11/23 Left 07/11/23 Right  08/02/23 Rt 08/09/23 Rt 08/15/23 Rt 08/22/23  Forearm supination 87 88 56 72  85  Forearm pronation  80 80 74 84  85  Wrist flexion 71 76 21 28 39 39 (45 post manual therapy)   Wrist extension 73 77 26 29 45 48 (57 post manual therapy)   Rad dev    19 22   Ulnar dev    28 25   (Blank rows = not tested)   Hand AROM Right 07/11/23 Left 07/11/23 Rt 08/02/23 Rt 08/09/23 Rt 08/15/23 Rt 08/22/23  Full Fist Ability (or Gap to Distal Palmar Crease) Full fist but IF is tight  Unable with left hand due to finger fracture Fingers barely touch palm today Full fist full   Thumb Opposition  (Kapandji Scale)  9.5/10 10/10 4/10 8/10 8/10   Thumb MCP J    0-46  0-64 0-54  Thumb IP J    0-2  0-27 0-44  Radial Abduction   34*  36* 45*  TAM IF      85 + 106 + 64 = 255*  (Blank rows = not tested)   UPPER EXTREMITY MMT:    Eval:  NT at eval due to recent and still healing injuries. Will be tested when appropriate.   MMT Right TBD Left TBD  Shoulder flexion    Shoulder abduction    Shoulder adduction    Shoulder extension    Shoulder internal rotation    Shoulder external rotation    Middle trapezius    Lower trapezius    Elbow flexion    Elbow extension    Forearm  supination    Forearm pronation    Wrist flexion    Wrist extension    Wrist ulnar deviation    Wrist radial deviation    (Blank rows = not tested)  HAND FUNCTION: 08/22/23: Grip Rt: 26.7#,  Lt: 46#   Eval 08/02/23: not tested due to healing fxs   07/11/23:  Observed weakness in affected bil hands.  Grip strength Right: 53.6 lbs tender, Left: NT lbs   COORDINATION: 08/02/23 Eval: Observed coordination impairments with affected bil hands. TBD baseline measures.  9 Hole Peg Test Right: TBDsec, Left: TBD sec (TBD sec is WFL)   EDEMA:   08/02/23: Mildly swollen in right distal radius compared to left- 16.8cm right wrist vs 15cm left wrist  OBSERVATIONS:   08/02/23 Eval: Right wrist surgical area has some bruising and ecchymosis, otherwise looks clean and covered by Steri-Strips.  Left index finger distal phalanx largely nontender mild angulation, x-rays seem to show interval healing.   07/11/23 Eval: Some overt Heberden's nodes, tenderness about the CMC joints of the thumb, some overt stiffness especially with broken left index finger   TODAY'S TREATMENT:  08/22/23: She starts with active range of motion for exercise as well as new measures which shows continued stubborn stiffness in wrist flexion and extension, though good improvements at the forearm and hands.  We review her arthritis recommendations and exercises together, and to test whether stretches have been helping her wrist, OT performs gentle joint mobilizations at the right wrist followed by manual stretches in flexion and extension and then takes repeat measures.  These measures do show some significant improvements, so she is encouraged to continue stretching 4 or more times a day carefully with light but the long slow stretches.  She also was given therapy putty as she tolerates gripping and pinching now with no significant pain.  New therapy putty activities were educated to her to improve strength and stability of the right hand and  arm, though she should do them cautiously to avoid arthritis flareup.  She is also educated on radial nerve glides in the right arm as her "rubbing" sensation is actually most likely a radial nerve irritation.  She tolerates these well and states that this irritation feels better afterward.    Exercises - Wrist Flexion Stretch  - 4 x daily - 3-5 reps - 15 sec hold - Wrist Prayer Stretch  - 4 x daily - 3-5 reps - 15 sec hold - SPATULA stretch   - 2-4 x daily - 3 reps - 15 sec hold - Full Fist  - 2-3 x daily - 5 reps - Finger Extension "Pizza!"   - 2-3 x daily - 5 reps - Thumb Press  - 2-3 x daily - 5 reps - Thumb Opposition with Putty  - 2-3 x daily - 5 reps - Standing Radial Nerve Glide  - 4-6 x daily - 1 sets - 10-15 reps   PATIENT EDUCATION: Education details: See tx section above for details  Person educated: Patient Education method: Verbal Instruction, Teach back, Handouts  Education comprehension: States and demonstrates understanding, Additional Education required    HOME EXERCISE PROGRAM: Wrist Program:  Access Code: [email or web address] URL: [email or web address] Date: 08/02/2023 Prepared by: Fannie Knee  OA Program: Access Code: [email or web address] URL: [email or web address] Date: 07/11/2023 Prepared by: Fannie Knee   GOALS: Goals reviewed with patient? Yes   SHORT TERM GOALS: (STG required if POC>30 days) Target Date: 07/28/2023  Pt will obtain protective, custom orthotic. Goal status: 07/11/2023 MET   2.  Pt will demo/state understanding of initial HEP to improve pain levels and prerequisite motion. Goal status: 08/15/2023: Met   LONG TERM GOALS: Target Date: 09/08/2023  Pt will improve functional ability by decreased impairment per Quick DASH assessment from 27% to 15% or better, for better quality of life. Goal status: INITIAL  2.  Pt will improve grip strength in right hand from tender 53 lbs to at least nontender 56 lbs for functional use  at home and in IADLs. Goal status: INITIAL  3.  Pt will improve A/ROM in right hand full fist from loose fist with index finger extended to at least a tight full fist, to have functional motion for tasks like reach and grasp.  Goal status: INITIAL  4.  Pt will improve A/ROM in left hand index finger total active motion from limited range of motion due to broken DIP joint to at least 210 degrees total active motion, to have functional motion for tasks like reach and grasp.  Goal status: INITIAL  5.  Pt will improve coordination skills in bilateral hands, as seen by within functional limit score on nine-hole peg testing to have increased functional ability to carry out fine motor tasks (fasteners, etc.) and more complex, coordinated IADLs (meal prep, sports, etc.).  Goal status: INITIAL  6.  Pt will decrease pain at rest from 3-5/10 to 1-3/10 or better to have better sleep and occupational participation in daily roles. Goal status: INITIAL  ASSESSMENT:  CLINICAL IMPRESSION: 08/22/23: Her joints of the right wrist did feel supple today with passive range of motion and joint mobilizations, so OT is hopeful she should not need a dynamic mobilization orthosis, and she was encouraged to stretch more often and longer and more thoughtfully, in order to make progress.  We can still make the orthosis if this is not possible for her, or her body is simply staying stubbornly stiff.    PLAN:  OT FREQUENCY: 1-2x/week  OT DURATION: 6 weeks through 09/15/2023 and up to 10  total visits as needed  PLANNED INTERVENTIONS: 97168 OT Re-evaluation, 97535 self care/ADL training, 98119 therapeutic exercise, 97530 therapeutic activity, 97112 neuromuscular re-education, 97140 manual therapy, 97035 ultrasound, 97039 fluidotherapy, 97010 moist heat, 97010 cryotherapy, 97760 Orthotics management and training, 14782 Splinting (initial encounter), [phone] Subsequent splinting/medication, Dry needling, energy conservation,  coping strategies training, patient/family education, and DME and/or AE instructions   CONSULTED AND AGREED WITH PLAN OF CARE: Patient  PLAN FOR NEXT SESSION:   Check new nerve gliding and therapy putty activities, check range of motion if wrist is stubbornly stiff consider dynamic mobilization orthosis.   Fannie Knee, OTR/L, CHT 08/22/2023, 5:23 PM

## 2023-08-22 ENCOUNTER — Encounter: Payer: Self-pay | Admitting: Rehabilitative and Restorative Service Providers"

## 2023-08-22 ENCOUNTER — Ambulatory Visit (INDEPENDENT_AMBULATORY_CARE_PROVIDER_SITE_OTHER): Admitting: Rehabilitative and Restorative Service Providers"

## 2023-08-22 DIAGNOSIS — R600 Localized edema: Secondary | ICD-10-CM | POA: Diagnosis not present

## 2023-08-22 DIAGNOSIS — M25641 Stiffness of right hand, not elsewhere classified: Secondary | ICD-10-CM

## 2023-08-22 DIAGNOSIS — R278 Other lack of coordination: Secondary | ICD-10-CM

## 2023-08-22 DIAGNOSIS — M79642 Pain in left hand: Secondary | ICD-10-CM

## 2023-08-22 DIAGNOSIS — M25531 Pain in right wrist: Secondary | ICD-10-CM

## 2023-08-22 DIAGNOSIS — M6281 Muscle weakness (generalized): Secondary | ICD-10-CM | POA: Diagnosis not present

## 2023-08-22 DIAGNOSIS — M25642 Stiffness of left hand, not elsewhere classified: Secondary | ICD-10-CM

## 2023-08-22 DIAGNOSIS — M25631 Stiffness of right wrist, not elsewhere classified: Secondary | ICD-10-CM | POA: Diagnosis not present

## 2023-08-22 DIAGNOSIS — M79641 Pain in right hand: Secondary | ICD-10-CM

## 2023-08-25 ENCOUNTER — Ambulatory Visit
Admission: RE | Admit: 2023-08-25 | Discharge: 2023-08-25 | Disposition: A | Discharge status: Home / Self Care | Source: Ambulatory Visit | Attending: Obstetrics & Gynecology | Admitting: Obstetrics & Gynecology

## 2023-08-25 DIAGNOSIS — R928 Other abnormal and inconclusive findings on diagnostic imaging of breast: Secondary | ICD-10-CM

## 2023-08-28 ENCOUNTER — Other Ambulatory Visit: Payer: Self-pay

## 2023-08-28 ENCOUNTER — Ambulatory Visit: Payer: 59 | Attending: Obstetrics & Gynecology

## 2023-08-28 DIAGNOSIS — M6281 Muscle weakness (generalized): Secondary | ICD-10-CM | POA: Insufficient documentation

## 2023-08-28 DIAGNOSIS — R279 Unspecified lack of coordination: Secondary | ICD-10-CM | POA: Diagnosis present

## 2023-08-28 DIAGNOSIS — R293 Abnormal posture: Secondary | ICD-10-CM | POA: Insufficient documentation

## 2023-08-28 DIAGNOSIS — M62838 Other muscle spasm: Secondary | ICD-10-CM | POA: Insufficient documentation

## 2023-08-28 DIAGNOSIS — N941 Unspecified dyspareunia: Secondary | ICD-10-CM | POA: Diagnosis not present

## 2023-08-28 DIAGNOSIS — R102 Pelvic and perineal pain: Secondary | ICD-10-CM | POA: Insufficient documentation

## 2023-08-28 NOTE — Patient Instructions (Addendum)
 Moisturizers They are used in the vagina to hydrate the mucous membrane that make up the vaginal canal. Designed to keep a more normal acid balance (ph) Once placed in the vagina, it will last between two to three days.  Use 2-3 times per week at bedtime  Ingredients to avoid is glycerin and fragrance, can increase chance of infection Should not be used just before sex due to causing irritation Most are gels administered either in a tampon-shaped applicator or as a vaginal suppository. They are non-hormonal.   Types of Moisturizers(internal use)  Vitamin E vaginal suppositories- Whole foods, Amazon Moist Again Coconut oil- can break down condoms, any grocery store (prefer organic) Julva- (Do no use if taking  Tamoxifen) amazon Yes moisturizer- amazon NeuEve Silk , NeuEve Silver for menopausal or over 65 (if have severe vaginal atrophy or cancer treatments use NeuEve Silk for  1 month than move to [employer])- [employer], [place].com Olive and Bee intimate cream- www.oliveandbee.com.au Mae vaginal moisturizer- Amazon Aloe Good Clean Love Hyaluronic acid Hyalofemme Reveree hyaluronic acid inserts   Creams to use externally on the Vulva area Marathon Oil (good for for cancer patients that had radiation to the area)- [place] or [employer].[email or web address]://[email or web address]/ Vulva Balm/ V-magic cream by medicine mama- amazon Julva-amazon Vital "V Wild Yam salve ( help moisturize and help with thinning vulvar area, does have Beeswax MoodMaid Botanical Pro-Meno Wild Yam Cream- Amazon Desert Harvest Gele Cleo by Zane Herald labial moisturizer (Amazon),  Coconut or olive oil aloe Good Clean Love Enchanted Rose by intimate rose  Things to avoid in the vaginal area Do not use things to irritate the vulvar area No lotions just specialized creams for the vulva area- Neogyn, V-magic,  No soaps; can use Aveeno or Calendula cleanser, unscented Dove if needed. Must be gentle No deodorants No douches Good to  sleep without underwear to let the vaginal area to air out No scrubbing: spread the lips to let warm water rinse over labias and pat dry         Lubrication Used for intercourse to reduce friction Avoid ones that have glycerin, nonoxynol-9, petroleum, propylene glycol, chlorhexidine gluconate, warming gels, tingling gels, icing or cooling gel, scented Avoid parabens due to a preservative similar to female sex hormone May need to be reapplied once or several times during sexual activity Can be applied to both partners genitals prior to vaginal penetration to minimize friction or irritation Prevent irritation and mucosal tears that cause post coital pain and increased the risk of vaginal and urinary tract infections Oil-based lubricants cannot be used with condoms due to breaking them down.  Least likely to irritate vaginal tissue.  Plant based-lubes are safe Silicone-based lubrication are thicker and last long and used for post-menopausal women  Vaginal Lubricators Here is a list of some suggested lubricators you can use for intercourse. Use the most hypoallergenic product.  You can place on you or your partner.  Slippery Stuff ( water based) Sylk or Sliquid Natural H2O ( good  if frequent UTI's)- walmart, amazon Sliquid organics silk-(aloe and silicone based ) [employer] (www.blossom-organics.com)- (aloe based ) Coconut oil, olive oil -not good with condoms  PJur Woman Nude- (water based) amazon Uberlube- ( silicon) Amazon Aloe Vera- Sprouts has an organic one Yes lubricant- (water based and has plant oil based similar to silicone) [employer] Platinum-Silicone, Target, [facility] Olive and Bee intimate cream-  www.oliveandbee.com.au Pink - International [employer] Erosense Sync- walmart, amazon Coconu- coconu.com Desert [employer] Good Clean Love lubricants  Things to avoid in lubricants are glycerin, warming gels, tingling gels, icing or cooling  gels, and scented gels.  Also  avoid Vaseline. KY jelly,  and Astroglide contain chlorhexidine which kills good bacteria(lactobacilli)  Things to avoid in the vaginal area Do not use things to irritate the vulvar area No lotions- see below Soaps you  can use :Aveeno, Calendula, Good Clean Love cleanser if needed. Must be gentle No deodorants No douches Good to sleep without underwear to let the vaginal area to air out No scrubbing: spread the lips to let warm water rinse over labias and pat dry  Creams that can be used on the Vulva Area V [employer], walmart Vital V Wild Yam Salve Julva- [employer] Botanical Pro-Meno Wild Yam Cream Coconut oil, olive oil Cleo by [employer] labial moisturizer -Dana Corporation,  [place]ert [place] Releveum ( lidocaine) or Desert [employer] Corporation Yes Moisturizer     Dilators: -Intimate Rose -VWell

## 2023-08-28 NOTE — Therapy (Signed)
 OUTPATIENT PHYSICAL THERAPY FEMALE PELVIC EVALUATION   Patient Name: Shelley Figueroa MRN: [id number] DOB:[birth year], 48 y.o., female Today's Date: 08/28/2023  END OF SESSION:  PT End of Session - 08/28/23 1621     Visit Number 2    Date for PT Re-Evaluation 02/12/24    Authorization Type [employer] 10% coinsurance    PT Start Time 1615    PT Stop Time 1726    PT Time Calculation (min) 71 min    Activity Tolerance Patient tolerated treatment well    Behavior During Therapy WFL for tasks assessed/performed             Past Medical History:  Diagnosis Date   Allergic rhinitis due to pollen    Anemia    with pregnancy   Anxiety    Arthritis    Asthma    Chronic cystitis    COVID-19 virus infection 05/12/2021   Depression    Dyspareunia in female    Dyspnea on exertion    per pt hard to recover after exercise (cardiologist has echo and cardiac CT ordered when schedule is opened up)   Elevated LFTs 08/16/2022   resolved   Essential hypertension    cardioloigst-  dr t. [facility]-- FH premature CAD--- 08-18-2014 normal stress echo   Foot pain 02/15/2022   History of chronic bronchitis    History of HPV infection    remote   History of kidney stones    History of recurrent UTIs    OA (osteoarthritis)    b. hands and b. feet   Pure hypercholesterolemia 09/20/2018   Renal calculus, left    per pt non-obstructive   Past Surgical History:  Procedure Laterality Date   CESAREAN SECTION  2004, 2005   CYSTOSCOPY N/A 11/06/2018   Procedure: CYSTOSCOPY;  Surgeon: Jerene Bears, MD;  Location: [facility];  Service: Gynecology;  Laterality: N/A;   HYSTEROSCOPY WITH D & C N/A 04/27/2022   Procedure: DILATATION AND CURETTAGE /HYSTEROSCOPY/MYOSURE POLYP RESECTION;  Surgeon: Jerene Bears, MD;  Location: [facility];  Service: Gynecology;  Laterality: N/A;   INTRAUTERINE DEVICE (IUD) INSERTION N/A 11/06/2018   Procedure: INTRAUTERINE DEVICE (IUD)  INSERTION WITH ULTRASOUND GUIDANCE;  Surgeon: Jerene Bears, MD;  Location: [facility];  Service: Gynecology;  Laterality: N/A;   INTRAUTERINE DEVICE (IUD) INSERTION N/A 04/27/2022   Procedure: INTRAUTERINE DEVICE (IUD) INSERTION;  Surgeon: Jerene Bears, MD;  Location: [facility];  Service: Gynecology;  Laterality: N/A;   IUD REMOVAL N/A 11/06/2018   Procedure: INTRAUTERINE DEVICE (IUD) REMOVAL;  Surgeon: Jerene Bears, MD;  Location: [facility];  Service: Gynecology;  Laterality: N/A;  Mirena IUD removal and reinsertion with ultrasound guidance.   IUD REMOVAL N/A 04/27/2022   Procedure: INTRAUTERINE DEVICE (IUD) REMOVAL;  Surgeon: Jerene Bears, MD;  Location: [facility];  Service: Gynecology;  Laterality: N/A;   KNEE ARTHROSCOPY Right 1995   with left knee realignment   KNEE SURGERY Left 1991   NASAL SINUS SURGERY  2015   wtih septoplasty   ORIF WRIST FRACTURE Right 07/18/2023   Procedure: OPEN REDUCTION INTERNAL FIXATION (ORIF) WRIST FRACTURE;  Surgeon: Sheral Apley, MD;  Location: [facility];  Service: Orthopedics;  Laterality: Right;   SEPTOPLASTY     TONSILLECTOMY  1987   Patient Active Problem List   Diagnosis Date Noted   Closed fracture of right distal radius 07/18/2023   Elevated LFTs  08/16/2022   Encounter for removal of intrauterine contraceptive device (IUD) 04/27/2022   Foot pain 02/15/2022   Joint pain in both hands 01/29/2021   Family history of coronary artery disease in father 07/30/2019   Contrast media allergy 07/30/2019   Insomnia 06/15/2018   Hyperlipidemia 04/06/2018   Encounter for annual general medical examination with abnormal findings in adult 12/28/2016   Asthma, chronic 05/28/2014   Essential hypertension 05/28/2014   ADJ DISORDER WITH MIXED ANXIETY & DEPRESSED MOOD 07/15/2009   Allergic rhinitis 07/06/2009    PCP: Doreene Nest, NP  REFERRING PROVIDER: Jerene Bears,  MD   REFERRING DIAG: N94.10 (ICD-10-CM) - Dyspareunia in female  THERAPY DIAG:  Pelvic pain  Other muscle spasm  Muscle weakness (generalized)  Abnormal posture  Unspecified lack of coordination  Rationale for Evaluation and Treatment: Rehabilitation  ONSET DATE: entire life  SUBJECTIVE:                                                                                                                                                                                           SUBJECTIVE STATEMENT: Pt states that she has always had pain with vaginal penetration. She has had 2 sexual partners and pain with both. She has just been told to just use lubricant. She is using estrogen cream 3x/day. She states she and partner are having intercourse every other day which has helped reduce pain. She has discovered that she is asexual about a year and a half ago. She states that she has vibrator and will have an orgasm when she needs to. She and her partner have explored many different options for intimacy and she feels like she just doesn't want intimacy. She has used dilators in the past and feels like they are not helpful. She has been on birth control essentially since she was 31. When she was still having menstrual cycles she states that they were fairly regular and not abnormally heavy. She has consistent breast pain.   PAIN:  Are you having pain? Yes NPRS scale: 10/10 Pain location: Vaginal  Pain type: burning, sharp, and throbbing Pain description: intermittent   Aggravating factors: intercourse, vaginal exams, tampon Relieving factors: no vaginal penetration or intimate activity  PRECAUTIONS: None  RED FLAGS: None   WEIGHT BEARING RESTRICTIONS: No  FALLS:  Has patient fallen in last 6 months? No  OCCUPATION: vice president of sales, works for home  ACTIVITY LEVEL : daily walks (1.5 miles), Systems analyst 3x/week 45 minutes   PLOF: Independent  PATIENT GOALS: to have less  painful intercourse with partner   PERTINENT HISTORY:  2 c-sections, chronic cystitis,  anxiety, asthma, anxiety, multiple IUD placements/removals all very painful  BOWEL MOVEMENT: Pain with bowel movement: No Type of bowel movement:Frequency 1x/day and Strain lately more with pain medications, but the last 8 years she has been much better Fully empty rectum: Yes:   Leakage: No Pads: No Fiber supplement/laxative Yes   URINATION: Pain with urination: No Fully empty bladder: Yes:   Stream: Strong Urgency: Yes  Frequency: every couple of hours Leakage: Laughing - occasional  Pads: No  INTERCOURSE:  Ability to have vaginal penetration Yes  Pain with intercourse: Initial Penetration, During Penetration, Deep Penetration, and After Intercourse DrynessYes - feels like she will get wet and aroused 1x/month Climax: able to have orgasm, but only with external stimulation Marinoff Scale: 2/3 Lubricant: using a lot of lubricant  PREGNANCY: Vaginal deliveries 0 Tearing No Episiotomy No C-section deliveries 2 Currently pregnant No  PROLAPSE: None   OBJECTIVE:  Note: Objective measures were completed at Evaluation unless otherwise noted.  08/28/23:   PATIENT SURVEYS:   PFIQ-7: 56  COGNITION: Overall cognitive status: Within functional limits for tasks assessed     SENSATION: Light touch: Appears intact  FUNCTIONAL TESTS: Squat: WNL Single leg stance:  Rt: pelvic drop  Lt: pelvic drop Curl-up test: deferred   GAIT: Assistive device utilized: None Comments: WNL  POSTURE: rounded shoulders, forward head, decreased lumbar lordosis, increased thoracic kyphosis, and posterior pelvic tilt   LUMBARAROM/PROM: WNL   PALPATION:   General: WNL  Pelvic Alignment: deferred  Abdominal: apical breathing pattern with decreased rib cage mobility                External Perineal Exam: deferred                             Internal Pelvic Floor: deferred  Patient  confirms identification and approves PT to assess internal pelvic floor and treatment Yes  PELVIC [id number]   MMT eval  Vaginal   Internal Anal Sphincter   External Anal Sphincter   Puborectalis   Diastasis Recti   (Blank rows = not tested)        TONE: deferred  PROLAPSE: deferred  TODAY'S TREATMENT:                                                                                                                              DATE:   08/28/23 EVAL  Neuromuscular re-education: Pt education on down training and autonomic nervous system  Pt education on pain neuroscience education and role of partner in vaginal sensitization Therapeutic activities: Vaginal moisturizers Vaginal lubricants Pt education on dilators and looked at different sample sets - benefit of high quality dilator  Benefit of vibration of vaginal and vulvar tissues     PATIENT EDUCATION:  Education details: See above Person educated: Patient Education method: Explanation, Demonstration, Tactile cues, Verbal cues, and Handouts Education comprehension: verbalized understanding  HOME EXERCISE PROGRAM: Written  handout   ASSESSMENT:  CLINICAL IMPRESSION: Patient is a 48 y.o. female who was seen today for physical therapy evaluation and treatment for dyspareunia. Objective findings limited this session due to extensive subjective findings, but due include abnormal posture, decreased functional core weakness, and apical breathing pattern with decreased rib cage mobility; we will plan to perform internal pelvic floor muscle exam next session and begin manual treatment accordingly. Based on subjective history, we discussed that signs and symptoms may be pointing to some hormonal contribution that could be benefiting from vaginal estrogen. However, it appears that there is a high level of vaginal tissue restriction and nervous system up regulation that are also contributing to symptoms. We discussed starting regular  internal vaginal moisturizer in addition to the estrogen cream that she is using 3x/week, possibly using after intercourse to see if it helps to soothe tissues. Pt education provided on down training for nervous system and pain neuroscience education to assist with reducing pain during intercourse. She was given a list of good vaginal lubricants and samples of silicone based lubricant to try in order to reduce friction. We went of different dilator sets and discussed how this will be an important part of recovery; she reports all dilators were different than what was previously used. Believe using high quality dilators in conjunction with guided exercises and physical therapy. She will continue to benefit from skilled PT intervention in order to improve pain with intercourse, improve discomfort in vulvar vaginal tissues, and improve quality of life.   OBJECTIVE IMPAIRMENTS: decreased activity tolerance, decreased coordination, decreased endurance, decreased mobility, decreased strength, increased muscle spasms, impaired tone, postural dysfunction, and pain.   ACTIVITY LIMITATIONS:  vaginal penetration  PARTICIPATION LIMITATIONS: interpersonal relationship and gynecological exams   PERSONAL FACTORS: 3+ comorbidities: medical history  are also affecting patient's functional outcome.   REHAB POTENTIAL: Fair chronic dyspareunia and asexual preference  CLINICAL DECISION MAKING: Evolving/moderate complexity  EVALUATION COMPLEXITY: Moderate   GOALS: Goals reviewed with patient? Yes  SHORT TERM GOALS: Target date: 09/25/2023   Pt will be independent with HEP.   Baseline: Goal status: INITIAL  2.  Pt will report 25% improvement in pain with vaginal penetration.  Baseline:  Goal status: INITIAL  3.  Pt will begin using dilators at least 3x/week in order to start vaginal desensitization program on her own.  Baseline:  Goal status: INITIAL  4.  Pt will perform dialy vaginal moisturization.   Baseline:  Goal status: INITIAL  5.  Pt will be independent with diaphragmatic breathing and down training activities in order to improve pelvic floor relaxation.  Baseline:  Goal status: INITIAL   LONG TERM GOALS: Target date: 02/12/24  Pt will be independent with advanced HEP.   Baseline:  Goal status: INITIAL  2.  Pt will report 50% improvement in pain with vaginal penetration. Baseline:  Goal status: INITIAL  3.  Pt will be independent with dilator activities and progression to larger sizes.  Baseline:  Goal status: INITIAL  4.  Pt will demonstrate normal pelvic floor muscle A/ROM and tissue quality.  Baseline:  Goal status: INITIAL  5.  Pt will be independent with vagal toning activities for improved down training.  Baseline:  Goal status: INITIAL   PLAN:  PT FREQUENCY: 1-2x/week  PT DURATION: 6 months  PLANNED INTERVENTIONS: 97110-Therapeutic exercises, 97530- Therapeutic activity, 97112- Neuromuscular re-education, 97535- Self Care, 16109- Manual therapy, Dry Needling, and Biofeedback  PLAN FOR NEXT SESSION: Internal vaginal pelvic floor muscle exam; diaphragmatic breathing  breathing and initial down training exercises; abdominal scar tissue exam and mobilization.    Julio Alm, PT, DPT03/31/256:54 PM

## 2023-08-30 NOTE — Therapy (Signed)
 OUTPATIENT OCCUPATIONAL THERAPY TREATMENT NOTE  Patient Name: Shelley Figueroa MRN: [id number] DOB:1975-11-08, 48 y.o., female Today's Date: 08/31/2023  PCP: Allayne Gitelman NP  REFERRING PROVIDER: Margarita Rana, MD   END OF SESSION:  OT End of Session - 08/31/23 1306     Visit Number 5    Number of Visits 10    Date for OT Re-Evaluation 09/15/23    Authorization Type [employer]    OT Start Time 1306    OT Stop Time 1349    OT Time Calculation (min) 43 min    Equipment Utilized During Treatment --    Activity Tolerance Patient tolerated treatment well;No increased pain;Patient limited by fatigue;Patient limited by pain    Behavior During Therapy [facility] for tasks assessed/performed             Past Medical History:  Diagnosis Date   Allergic rhinitis due to pollen    Anemia    with pregnancy   Anxiety    Arthritis    Asthma    Chronic cystitis    COVID-19 virus infection 05/12/2021   Depression    Dyspareunia in female    Dyspnea on exertion    per pt hard to recover after exercise (cardiologist has echo and cardiac CT ordered when schedule is opened up)   Elevated LFTs 08/16/2022   resolved   Essential hypertension    cardioloigst-  dr t. [facility]-- FH premature CAD--- 08-18-2014 normal stress echo   Foot pain 02/15/2022   History of chronic bronchitis    History of HPV infection    remote   History of kidney stones    History of recurrent UTIs    OA (osteoarthritis)    b. hands and b. feet   Pure hypercholesterolemia 09/20/2018   Renal calculus, left    per pt non-obstructive   Past Surgical History:  Procedure Laterality Date   CESAREAN SECTION  2004, 2005   CYSTOSCOPY N/A 11/06/2018   Procedure: CYSTOSCOPY;  Surgeon: Jerene Bears, MD;  Location: [facility];  Service: Gynecology;  Laterality: N/A;   HYSTEROSCOPY WITH D & C N/A 04/27/2022   Procedure: DILATATION AND CURETTAGE /HYSTEROSCOPY/MYOSURE POLYP RESECTION;  Surgeon: Jerene Bears, MD;   Location: [facility];  Service: Gynecology;  Laterality: N/A;   INTRAUTERINE DEVICE (IUD) INSERTION N/A 11/06/2018   Procedure: INTRAUTERINE DEVICE (IUD) INSERTION WITH ULTRASOUND GUIDANCE;  Surgeon: Jerene Bears, MD;  Location: [facility];  Service: Gynecology;  Laterality: N/A;   INTRAUTERINE DEVICE (IUD) INSERTION N/A 04/27/2022   Procedure: INTRAUTERINE DEVICE (IUD) INSERTION;  Surgeon: Jerene Bears, MD;  Location: [facility];  Service: Gynecology;  Laterality: N/A;   IUD REMOVAL N/A 11/06/2018   Procedure: INTRAUTERINE DEVICE (IUD) REMOVAL;  Surgeon: Jerene Bears, MD;  Location: [facility];  Service: Gynecology;  Laterality: N/A;  Mirena IUD removal and reinsertion with ultrasound guidance.   IUD REMOVAL N/A 04/27/2022   Procedure: INTRAUTERINE DEVICE (IUD) REMOVAL;  Surgeon: Jerene Bears, MD;  Location: [facility];  Service: Gynecology;  Laterality: N/A;   KNEE ARTHROSCOPY Right 1995   with left knee realignment   KNEE SURGERY Left 1991   NASAL SINUS SURGERY  2015   wtih septoplasty   ORIF WRIST FRACTURE Right 07/18/2023   Procedure: OPEN REDUCTION INTERNAL FIXATION (ORIF) WRIST FRACTURE;  Surgeon: Sheral Apley, MD;  Location: [facility];  Service: Orthopedics;  Laterality: Right;   SEPTOPLASTY  TONSILLECTOMY  1987   Patient Active Problem List   Diagnosis Date Noted   Closed fracture of right distal radius 07/18/2023   Elevated LFTs 08/16/2022   Encounter for removal of intrauterine contraceptive device (IUD) 04/27/2022   Foot pain 02/15/2022   Joint pain in both hands 01/29/2021   Family history of coronary artery disease in father 07/30/2019   Contrast media allergy 07/30/2019   Insomnia 06/15/2018   Hyperlipidemia 04/06/2018   Encounter for annual general medical examination with abnormal findings in adult 12/28/2016   Asthma, chronic 05/28/2014   Essential hypertension 05/28/2014   ADJ  DISORDER WITH MIXED ANXIETY & DEPRESSED MOOD 07/15/2009   Allergic rhinitis 07/06/2009    ONSET DATE: DOS: 07/18/23, DOI 07/12/23  REFERRING DIAG:  L24.401,U27.253 (ICD-10-CM) - Joint pain in both hands    THERAPY DIAG:  Muscle weakness (generalized)  Other lack of coordination  Pain in left hand  Stiffness of right hand, not elsewhere classified  Pain in right wrist  Stiffness of right wrist, not elsewhere classified  Pain in right hand  Stiffness of left hand, not elsewhere classified  Rationale for Evaluation and Treatment: Rehabilitation  PERTINENT HISTORY: New fall and distal radius fracture with subsequent surgery  07/18/23. This is being evaluated in OT 08/02/23.   Was previously being seen in OT for hand arthritis and that info is as follows (that POC will be put on hold): Finger fracture in 05/12/2023, chronic arthritis, plantar fasciitis She states she broke her Lt IF on 05/12/23, and it went untreated and has a nonhealing fracture for which she just started wearing a prefabricated metal anger splint.  She also has chronic bil hand pain R> L, primarily in the base of her thumbs as well as her tip joints of her fingers with some overt Heberden's nodes. She states neg for auto-immune issues.  Also here for PT eval for plantar fascitis.   PRECAUTIONS: None; RED FLAGS: None   WEIGHT BEARING RESTRICTIONS: Yes less than 5 pounds in left hand recommended for the next month and nothing painful   SUBJECTIVE:   SUBJECTIVE STATEMENT: Now approx 6 weeks post-op Rt DRF and ORIF and 7 weeks immobilized Lt IF fx. She states seeing her surgeon who told her she could discharge her orthosis at this time.  She is not having any significant pain coming in     PAIN:  Are you having pain?   Yes: NPRS scale: constant pain  1/10 on average depending on activity.  Pain location: Rt wrist/thumb Pain description: Aching Aggravating factors: Repetitive or heavy gripping Relieving  factors: Rest and heat   PATIENT GOALS: To improve pain in both of her hands and also adequately heal her finger fracture  NEXT MD VISIT: 08/10/2023    OBJECTIVE: (All objective assessments below are from updated evaluation on: 08/02/23 unless otherwise specified.)   HAND DOMINANCE: Right   ADLs: Overall ADLs: States decreased ability to grab, hold household objects, pain and difficulty to open containers, perform FMS tasks (manipulate fasteners on clothing)   FUNCTIONAL OUTCOME MEASURES: 08/02/23: Quick DASH: 72% issues now after DRF   07/11/23: Quick DASH 27% impairment today  (Higher % Score  =  More Impairment)     UPPER EXTREMITY ROM     Shoulder to Wrist AROM Right 07/11/23 Left 07/11/23 Right  08/02/23 Rt 08/09/23 Rt 08/15/23 Rt 08/22/23 Rt 08/31/23  Forearm supination 87 88 56 72  85   Forearm pronation  80 80 74 84  85  Wrist flexion 71 76 21 28 39 39 (45 post manual therapy)  39 (48 post manual)   Wrist extension 73 77 26 29 45 48 (57 post manual therapy)  60 (59 after manual therapy)   Rad dev    19 22  28   Ulnar dev    28 25  28   (Blank rows = not tested)   Hand AROM Right 07/11/23 Left 07/11/23 Rt 08/02/23 Rt 08/09/23 Rt 08/15/23 Rt 08/22/23  Full Fist Ability (or Gap to Distal Palmar Crease) Full fist but IF is tight  Unable with left hand due to finger fracture Fingers barely touch palm today Full fist full   Thumb Opposition  (Kapandji Scale)  9.5/10 10/10 4/10 8/10 8/10   Thumb MCP J    0-46  0-64 0-54  Thumb IP J    0-2  0-27 0-44  Radial Abduction   34*  36* 45*  TAM IF      85 + 106 + 64 = 255* (LT IF)  (Blank rows = not tested)   UPPER EXTREMITY MMT:     MMT Right 08/31/23  Elbow flexion 5/5  Elbow extension 5/5  Forearm supination 4/5 tender  Forearm pronation 4+/5  Wrist flexion 4-/5 tender to thumb  Wrist extension 4-/5 tender to ulnar head  (Blank rows = not tested)  HAND FUNCTION: 08/31/23: Grip Rt: 37#   08/22/23: Grip Rt: 26.7#,  Lt: 46#    Eval 08/02/23: not tested due to healing fxs   07/11/23:  Observed weakness in affected bil hands.  Grip strength Right: 53.6 lbs tender, Left: NT lbs   COORDINATION: 08/31/23: 9HPT: Rt 24sec WFL today   EDEMA:   08/02/23: Mildly swollen in right distal radius compared to left- 16.8cm right wrist vs 15cm left wrist  OBSERVATIONS:   08/02/23 Eval: Right wrist surgical area has some bruising and ecchymosis, otherwise looks clean and covered by Steri-Strips.  Left index finger distal phalanx largely nontender mild angulation, x-rays seem to show interval healing.   07/11/23 Eval: Some overt Heberden's nodes, tenderness about the CMC joints of the thumb, some overt stiffness especially with broken left index finger   TODAY'S TREATMENT:  08/31/23: She starts with active range of motion for new measures which shows excellent wrist extension improvements also in deviations.  Wrist flexion is somewhat tight today, so OT performs manual therapy IASTM to the dorsum of the forearm as well as manual therapy joint mobilizations to promote wrist flexion, and afterwards wrist flexion is significantly improved.  Nerve gliding and therapy putty activities were quickly reviewed, and she states these are going well.  Next, to help her wean from her orthosis, OT provides a compressive wrist wrap for self-care/safety and to help prevent pain.  She was told that she still can wear her orthosis for the next 2 to 3 weeks as needed for heavier tasks or when outside of the home.  She was also given light strengthening for the wrist as bolded below, isometrically, nonpainfully.  These new strengthening were performed with her and she tolerates well with just some mild fatigue and no significant pain.   Exercises - Wrist Flexion Stretch  - 4 x daily - 3-5 reps - 15 sec hold - Wrist Prayer Stretch  - 4 x daily - 3-5 reps - 15 sec hold - Full Fist  - 2-3 x daily - 5 reps - Thumb Press  - 2-3 x daily - 5 reps - Thumb Opposition  with  Putty  - 2-3 x daily - 5 reps - Standing Radial Nerve Glide  - 4-6 x daily - 1 sets - 10-15 reps - Isometric Wrist Extension Pronated  - 2-3 x daily - 4-5 x weekly - 5 reps - 10 seconds hold - Isometric Wrist Extension Neutral  - 2-3 x daily - 4-5 x weekly - 1 sets - 5-10 reps - 10 sec hold - Seated Isometric Wrist Radial Deviation with Manual Resistance  - 4-6 x daily - 1 sets - 10-15 reps - Seated Isometric Wrist Flexion Supinated with Manual Resistance  - 4-6 x daily - 1 sets - 10-15 reps    PATIENT EDUCATION: Education details: See tx section above for details  Person educated: Patient Education method: Engineer, structural, Teach back, Handouts  Education comprehension: States and demonstrates understanding, Additional Education required    HOME EXERCISE PROGRAM: Wrist Program:  Access Code: [email or web address] URL: [email or web address] Date: 08/02/2023 Prepared by: Fannie Knee  OA Program: Access Code: [email or web address] URL: [email or web address] Date: 07/11/2023 Prepared by: Fannie Knee   GOALS: Goals reviewed with patient? Yes   SHORT TERM GOALS: (STG required if POC>30 days) Target Date: 07/28/2023  Pt will obtain protective, custom orthotic. Goal status: 07/11/2023 MET   2.  Pt will demo/state understanding of initial HEP to improve pain levels and prerequisite motion. Goal status: 08/15/2023: Met   LONG TERM GOALS: Target Date: 09/08/2023  Pt will improve functional ability by decreased impairment per Quick DASH assessment from 27% to 15% or better, for better quality of life. Goal status: INITIAL  2.  Pt will improve grip strength in right hand from tender 53 lbs to at least nontender 56 lbs for functional use at home and in IADLs. Goal status: INITIAL  3.  Pt will improve A/ROM in right hand full fist from loose fist with index finger extended to at least a tight full fist, to have functional motion for tasks like reach and grasp.  Goal  status: INITIAL  4.  Pt will improve A/ROM in left hand index finger total active motion from limited range of motion due to broken DIP joint to at least 210 degrees total active motion, to have functional motion for tasks like reach and grasp.  Goal status: INITIAL  5.  Pt will improve coordination skills in bilateral hands, as seen by within functional limit score on nine-hole peg testing to have increased functional ability to carry out fine motor tasks (fasteners, etc.) and more complex, coordinated IADLs (meal prep, sports, etc.).  Goal status: INITIAL  6.  Pt will decrease pain at rest from 3-5/10 to 1-3/10 or better to have better sleep and occupational participation in daily roles. Goal status: INITIAL  ASSESSMENT:  CLINICAL IMPRESSION: 08/31/23: Her doctor has confidence in the surgery and her recovery as he is allowing her to wean from her orthosis at 6 weeks postop completely.  OT does advise her to use some caution as heavier activities will not be good this early on.  She is tolerating light isometric strength very well.    PLAN:  OT FREQUENCY: 1-2x/week  OT DURATION: 6 weeks through 09/15/2023 and up to 10 total visits as needed  PLANNED INTERVENTIONS: 97168 OT Re-evaluation, 97535 self care/ADL training, 46962 therapeutic exercise, 97530 therapeutic activity, 97112 neuromuscular re-education, 97140 manual therapy, 97035 ultrasound, 97039 fluidotherapy, 97010 moist heat, 97010 cryotherapy, 97760 Orthotics management and training, 95284 Splinting (initial encounter), [id number] Subsequent splinting/medication, Dry needling, energy conservation, coping strategies training, patient/family education, and DME and/or  AE instructions   CONSULTED AND AGREED WITH PLAN OF CARE: Patient  PLAN FOR NEXT SESSION:   Review new isometrics on the wrist, upgrade to more resistive functional activities, and PR he wants tolerated-though this is typically done closer to 8 weeks postop.  Continue to  ensure that motion is improving and still consider a mobilization orthosis if needed   Fannie Knee, OTR/L, CHT 08/31/2023, 5:34 PM

## 2023-08-31 ENCOUNTER — Ambulatory Visit (INDEPENDENT_AMBULATORY_CARE_PROVIDER_SITE_OTHER): Admitting: Rehabilitative and Restorative Service Providers"

## 2023-08-31 ENCOUNTER — Encounter: Payer: Self-pay | Admitting: Rehabilitative and Restorative Service Providers"

## 2023-08-31 DIAGNOSIS — M79642 Pain in left hand: Secondary | ICD-10-CM | POA: Diagnosis not present

## 2023-08-31 DIAGNOSIS — R278 Other lack of coordination: Secondary | ICD-10-CM

## 2023-08-31 DIAGNOSIS — M6281 Muscle weakness (generalized): Secondary | ICD-10-CM | POA: Diagnosis not present

## 2023-08-31 DIAGNOSIS — M25631 Stiffness of right wrist, not elsewhere classified: Secondary | ICD-10-CM

## 2023-08-31 DIAGNOSIS — M25641 Stiffness of right hand, not elsewhere classified: Secondary | ICD-10-CM | POA: Diagnosis not present

## 2023-08-31 DIAGNOSIS — M79641 Pain in right hand: Secondary | ICD-10-CM

## 2023-08-31 DIAGNOSIS — M25531 Pain in right wrist: Secondary | ICD-10-CM

## 2023-08-31 DIAGNOSIS — M25642 Stiffness of left hand, not elsewhere classified: Secondary | ICD-10-CM

## 2023-09-04 ENCOUNTER — Ambulatory Visit: Payer: 59 | Attending: Obstetrics & Gynecology

## 2023-09-04 DIAGNOSIS — R278 Other lack of coordination: Secondary | ICD-10-CM | POA: Insufficient documentation

## 2023-09-04 DIAGNOSIS — R102 Pelvic and perineal pain: Secondary | ICD-10-CM | POA: Diagnosis present

## 2023-09-04 DIAGNOSIS — M62838 Other muscle spasm: Secondary | ICD-10-CM | POA: Insufficient documentation

## 2023-09-04 DIAGNOSIS — R279 Unspecified lack of coordination: Secondary | ICD-10-CM | POA: Diagnosis present

## 2023-09-04 DIAGNOSIS — M6281 Muscle weakness (generalized): Secondary | ICD-10-CM | POA: Diagnosis present

## 2023-09-04 DIAGNOSIS — R293 Abnormal posture: Secondary | ICD-10-CM | POA: Diagnosis present

## 2023-09-04 NOTE — Therapy (Signed)
 OUTPATIENT OCCUPATIONAL THERAPY TREATMENT NOTE  Patient Name: Shelley Figueroa MRN: [id number] DOB:1975-09-21, 48 y.o., female Today's Date: 09/05/2023  PCP: Allayne Gitelman NP  REFERRING PROVIDER: Margarita Rana, MD   END OF SESSION:  OT End of Session - 09/05/23 1020     Visit Number 6    Number of Visits 10    Date for OT Re-Evaluation 09/15/23    Authorization Type [employer]    OT Start Time 1020    OT Stop Time 1059    OT Time Calculation (min) 39 min    Activity Tolerance Patient tolerated treatment well;No increased pain;Patient limited by fatigue;Patient limited by pain    Behavior During Therapy [facility] for tasks assessed/performed              Past Medical History:  Diagnosis Date   Allergic rhinitis due to pollen    Anemia    with pregnancy   Anxiety    Arthritis    Asthma    Chronic cystitis    COVID-19 virus infection 05/12/2021   Depression    Dyspareunia in female    Dyspnea on exertion    per pt hard to recover after exercise (cardiologist has echo and cardiac CT ordered when schedule is opened up)   Elevated LFTs 08/16/2022   resolved   Essential hypertension    cardioloigst-  dr t. [facility]-- FH premature CAD--- 08-18-2014 normal stress echo   Foot pain 02/15/2022   History of chronic bronchitis    History of HPV infection    remote   History of kidney stones    History of recurrent UTIs    OA (osteoarthritis)    b. hands and b. feet   Pure hypercholesterolemia 09/20/2018   Renal calculus, left    per pt non-obstructive   Past Surgical History:  Procedure Laterality Date   CESAREAN SECTION  2004, 2005   CYSTOSCOPY N/A 11/06/2018   Procedure: CYSTOSCOPY;  Surgeon: Jerene Bears, MD;  Location: [facility];  Service: Gynecology;  Laterality: N/A;   HYSTEROSCOPY WITH D & C N/A 04/27/2022   Procedure: DILATATION AND CURETTAGE /HYSTEROSCOPY/MYOSURE POLYP RESECTION;  Surgeon: Jerene Bears, MD;  Location: [facility];   Service: Gynecology;  Laterality: N/A;   INTRAUTERINE DEVICE (IUD) INSERTION N/A 11/06/2018   Procedure: INTRAUTERINE DEVICE (IUD) INSERTION WITH ULTRASOUND GUIDANCE;  Surgeon: Jerene Bears, MD;  Location: [facility] [place];  Service: Gynecology;  Laterality: N/A;   INTRAUTERINE DEVICE (IUD) INSERTION N/A 04/27/2022   Procedure: INTRAUTERINE DEVICE (IUD) INSERTION;  Surgeon: Jerene Bears, MD;  Location: [facility];  Service: Gynecology;  Laterality: N/A;   IUD REMOVAL N/A 11/06/2018   Procedure: INTRAUTERINE DEVICE (IUD) REMOVAL;  Surgeon: Jerene Bears, MD;  Location: [facility];  Service: Gynecology;  Laterality: N/A;  Mirena IUD removal and reinsertion with ultrasound guidance.   IUD REMOVAL N/A 04/27/2022   Procedure: INTRAUTERINE DEVICE (IUD) REMOVAL;  Surgeon: Jerene Bears, MD;  Location: [facility];  Service: Gynecology;  Laterality: N/A;   KNEE ARTHROSCOPY Right 1995   with left knee realignment   KNEE SURGERY Left 1991   NASAL SINUS SURGERY  2015   wtih septoplasty   ORIF WRIST FRACTURE Right 07/18/2023   Procedure: OPEN REDUCTION INTERNAL FIXATION (ORIF) WRIST FRACTURE;  Surgeon: Sheral Apley, MD;  Location: [facility];  Service: Orthopedics;  Laterality: Right;   SEPTOPLASTY     TONSILLECTOMY  1987  Patient Active Problem List   Diagnosis Date Noted   Closed fracture of right distal radius 07/18/2023   Elevated LFTs 08/16/2022   Encounter for removal of intrauterine contraceptive device (IUD) 04/27/2022   Foot pain 02/15/2022   Joint pain in both hands 01/29/2021   Family history of coronary artery disease in father 07/30/2019   Contrast media allergy 07/30/2019   Insomnia 06/15/2018   Hyperlipidemia 04/06/2018   Encounter for annual general medical examination with abnormal findings in adult 12/28/2016   Asthma, chronic 05/28/2014   Essential hypertension 05/28/2014   ADJ DISORDER WITH MIXED ANXIETY & DEPRESSED  MOOD 07/15/2009   Allergic rhinitis 07/06/2009    ONSET DATE: DOS: 07/18/23, DOI 07/12/23  REFERRING DIAG:  J47.829,F62.130 (ICD-10-CM) - Joint pain in both hands    THERAPY DIAG:  Muscle weakness (generalized)  Other lack of coordination  Stiffness of right wrist, not elsewhere classified  Pain in right hand  Pain in right wrist  Stiffness of right hand, not elsewhere classified  Rationale for Evaluation and Treatment: Rehabilitation  PERTINENT HISTORY: New fall and distal radius fracture with subsequent surgery  07/18/23. This is being evaluated in OT 08/02/23.   Was previously being seen in OT for hand arthritis and that info is as follows (that POC will be put on hold): Finger fracture in 05/12/2023, chronic arthritis, plantar fasciitis She states she broke her Lt IF on 05/12/23, and it went untreated and has a nonhealing fracture for which she just started wearing a prefabricated metal anger splint.  She also has chronic bil hand pain R> L, primarily in the base of her thumbs as well as her tip joints of her fingers with some overt Heberden's nodes. She states neg for auto-immune issues.  Also here for PT eval for plantar fascitis.   PRECAUTIONS: None; RED FLAGS: None   WEIGHT BEARING RESTRICTIONS: Yes less than 5 pounds in left hand recommended for the next month and nothing painful   SUBJECTIVE:   SUBJECTIVE STATEMENT: Now approx 7 weeks post-op Rt DRF and ORIF and 8 weeks immobilized Lt IF fx. She states  feeling pain since last session along dorsum of wrist from base of MCs to lateral elbow.  She has been wearing her orthosis and brace more. She admits to using her arm more than ever now.      PAIN:  Are you having pain?   Yes: NPRS scale: constant pain   5-6/10 on average depending on activity.  Pain location: Rt wrist/thumb Pain description: Aching Aggravating factors: Repetitive or heavy gripping Relieving factors: Rest and heat   PATIENT GOALS: To improve  pain in both of her hands and also adequately heal her finger fracture  NEXT MD VISIT: 08/10/2023    OBJECTIVE: (All objective assessments below are from updated evaluation on: 08/02/23 unless otherwise specified.)   HAND DOMINANCE: Right   ADLs: Overall ADLs: States decreased ability to grab, hold household objects, pain and difficulty to open containers, perform FMS tasks (manipulate fasteners on clothing)   FUNCTIONAL OUTCOME MEASURES: 08/02/23: Quick DASH: 72% issues now after DRF   07/11/23: Quick DASH 27% impairment today  (Higher % Score  =  More Impairment)     UPPER EXTREMITY ROM     Shoulder to Wrist AROM Right 07/11/23 Left 07/11/23 Right  08/02/23 Rt 08/09/23 Rt 08/15/23 Rt 08/22/23 Rt 08/31/23 Rt 09/05/23  Forearm supination 87 88 56 72  85    Forearm pronation  80 80 74 84  85  Wrist flexion 71 76 21 28 39 39 (45 post manual therapy)  39 (48 post manual)  49  Wrist extension 73 77 26 29 45 48 (57 post manual therapy)  60 (59 after manual therapy)  55  Rad dev    19 22  28    Ulnar dev    28 25  28    (Blank rows = not tested)   Hand AROM Right 07/11/23 Left 07/11/23 Rt 08/02/23 Rt 08/09/23 Rt 08/15/23 Rt 08/22/23  Full Fist Ability (or Gap to Distal Palmar Crease) Full fist but IF is tight  Unable with left hand due to finger fracture Fingers barely touch palm today Full fist full   Thumb Opposition  (Kapandji Scale)  9.5/10 10/10 4/10 8/10 8/10   Thumb MCP J    0-46  0-64 0-54  Thumb IP J    0-2  0-27 0-44  Radial Abduction   34*  36* 45*  TAM IF      85 + 106 + 64 = 255* (LT IF)  (Blank rows = not tested)   UPPER EXTREMITY MMT:     MMT Right 08/31/23 Rt TBD  Elbow flexion 5/5   Elbow extension 5/5   Forearm supination 4/5 tender   Forearm pronation 4+/5 TBD  Wrist flexion 4-/5 tender to thumb   Wrist extension 4-/5 tender to ulnar head   (Blank rows = not tested)  HAND FUNCTION: 09/05/23: Rt grip: 33#   08/31/23: Grip Rt: 37#   08/22/23: Grip Rt: 26.7#,   Lt: 46#   Eval 08/02/23: not tested due to healing fxs   07/11/23:  Observed weakness in affected bil hands.  Grip strength Right: 53.6 lbs tender, Left: NT lbs   COORDINATION: 08/31/23: 9HPT: Rt 24sec WFL today   EDEMA:   08/02/23: Mildly swollen in right distal radius compared to left- 16.8cm right wrist vs 15cm left wrist  OBSERVATIONS:   08/02/23 Eval: Right wrist surgical area has some bruising and ecchymosis, otherwise looks clean and covered by Steri-Strips.  Left index finger distal phalanx largely nontender mild angulation, x-rays seem to show interval healing.   07/11/23 Eval: Some overt Heberden's nodes, tenderness about the CMC joints of the thumb, some overt stiffness especially with broken left index finger   TODAY'S TREATMENT:  09/05/23: As she had exacerbation of pain, OT checks her motion and strength which seems to be just as good as before with a mild decrease in grip strength.  She presents as an exacerbation of tendinitis through the extensor carpi radialis brevis that connects to the base of the third metacarpal.  We discussed this possible tennis elbow and that is likely due to weaning from orthosis, being more active and starting strengthening programs.  To help manage it, OT performs manual therapy dry needling with her consent, and afterward she feels much better and relief of pain.  OT also does gentle joint mobilizations at the wrist in case she has any binding of the carpals.  We discussed using heat and self massage when she has a flareup of pain, and also backing off from hard or painful strengthening with isometrics or with therapy putty activities.  OT finishes by doing long slow stretches with slight traction, which she tolerates well, has no significant pain, states pain is down to a 1 or 2 out of 10 at the end of the session and is pleased.    Trigger Point Dry Needling  Initial Treatment: Pt instructed on Dry Needling rational,  procedures, and possible side  effects. Pt instructed to expect mild to moderate muscle soreness later in the day and/or into the next day.  Pt instructed in methods to reduce muscle soreness. Pt instructed to continue prescribed HEP. Patient verbalized understanding of these instructions and education.   Patient Verbal Consent Given: Yes Education Handout Provided: Yes Muscles Treated: Right ECRB Electrical Stimulation Performed: No Treatment Response/Outcome: Relief of pain, no significant bleeding or bruising etc.   PATIENT EDUCATION: Education details: See tx section above for details  Person educated: Patient Education method: Engineer, structural, Teach back, Handouts  Education comprehension: States and demonstrates understanding, Additional Education required    HOME EXERCISE PROGRAM: Wrist Program:  Access Code: [email or web address] URL: [email or web address] Date: 08/02/2023 Prepared by: Fannie Knee  OA Program: Access Code: [email or web address] URL: [email or web address] Date: 07/11/2023 Prepared by: Fannie Knee   GOALS: Goals reviewed with patient? Yes   SHORT TERM GOALS: (STG required if POC>30 days) Target Date: 07/28/2023  Pt will obtain protective, custom orthotic. Goal status: 07/11/2023 MET   2.  Pt will demo/state understanding of initial HEP to improve pain levels and prerequisite motion. Goal status: 08/15/2023: Met   LONG TERM GOALS: Target Date: 09/08/2023  Pt will improve functional ability by decreased impairment per Quick DASH assessment from 27% to 15% or better, for better quality of life. Goal status: Initial  2.  Pt will improve grip strength in right hand from tender 53 lbs to at least nontender 56 lbs for functional use at home and in IADLs. Goal status: Initial  3.  Pt will improve A/ROM in right hand full fist from loose fist with index finger extended to at least a tight full fist, to have functional motion for tasks like reach and grasp.  Goal  status: Initial  4.  Pt will improve A/ROM in left hand index finger total active motion from limited range of motion due to broken DIP joint to at least 210 degrees total active motion, to have functional motion for tasks like reach and grasp.  Goal status: Initial  5.  Pt will improve coordination skills in bilateral hands, as seen by within functional limit score on nine-hole peg testing to have increased functional ability to carry out fine motor tasks (fasteners, etc.) and more complex, coordinated IADLs (meal prep, sports, etc.).  Goal status: Initial  6.  Pt will decrease pain at rest from 3-5/10 to 1-3/10 or better to have better sleep and occupational participation in daily roles. Goal status: Initial  ASSESSMENT:  CLINICAL IMPRESSION: 09/05/23: She had an exacerbation of pain coming in today, and was flustered and upset.  By the end of the session her pain was mostly gone and she understood the problem and was asked to be mindful and cautious but continue on with stretching and strengthening as tolerated.  08/31/23: Her doctor has confidence in the surgery and her recovery as he is allowing her to wean from her orthosis at 6 weeks postop completely.  OT does advise her to use some caution as heavier activities will not be good this early on.  She is tolerating light isometric strength very well.    PLAN:  OT FREQUENCY: 1-2x/week  OT DURATION: 6 weeks through 09/15/2023 and up to 10 total visits as needed  PLANNED INTERVENTIONS: 97168 OT Re-evaluation, 97535 self care/ADL training, 78295 therapeutic exercise, 97530 therapeutic activity, 97112 neuromuscular re-education, 97140 manual therapy, 97035 ultrasound, 97039 fluidotherapy, 97010 moist heat, 97010 cryotherapy, 97760 Orthotics management and training, 62130 Splinting (initial  encounter), [phone] Subsequent splinting/medication, Dry needling, energy conservation, coping strategies training, patient/family education, and DME and/or AE  instructions   CONSULTED AND AGREED WITH PLAN OF CARE: Patient  PLAN FOR NEXT SESSION:   We will need to perform a progress note to determine the need for additional sessions.   Fannie Knee, OTR/L, CHT 09/05/2023, 12:53 PM

## 2023-09-04 NOTE — Patient Instructions (Signed)
 Vaginal trainers  Prior to Use:   Wash the vaginal trainer with soap and water before and after each use.   Use a water-soluble lubricant like Slippery Stuff or Surgulibe.   Avoid using Vaseline, coconut oil, or other oil-based lubricants. They are not water-soluble and can be irritating to the tissues in the vagina.   Do not use silicon-based lubricants with a silicon vaginal trainer. Using a siliconbased lubricant with a silicon device can contribute to break down of the material.  Setting Up Your Space   Work in a comfortable room lying on your back on a bed or couch with your knees bent and knees relaxed open. Use pillows underneath your knees as they are relaxed open and to support your upper back and head. Place a towel underneath your bottom to collect any lubricant.   Place your vaginal trainers and lubricant on a towel next to you within arm's reach for easy access.   Starting to use your trainer:  o Take 10-20 deep breaths to quiet your nervous system  o Perform stretches to help relax your hips and pelvic floor such as child's pose, cat/cow, or happy baby pose  Using Your Trainers   Coat the smallest vaginal trainer, or the size you are most comfortable using, with lubricant   Place the tip of the trainer at the opening to the vagina.   Take a few deep breaths to adjust to the sensation of the lubricant and trainer.   Slowly insert the rounded end of the trainer into your vaginal opening as far as you are comfortable. Pause and breathe if you experience pain, tension, or muscle guarding at any time. Once you feel comfortable gently slide trainer deeper into the vaginal canal as far as it will go without causing pain or discomfort.  Progressing with your Trainers   Gently press the trainer toward the bottom and sides of the vaginal opening to give it a gentle stretch. Pause and breathe at each spot and tension melts away.   Once fully inserted, turn the trainer clockwise and  counterclockwise to produce different sensations   Slowly move the trainer in and out as you breathe and focus on staying as relaxed as possible  To progress to the next size gradually, once one trainer is completely pain-free and comfortable to use, insert that smaller trainer first for 5-10 minutes and then follow with the next largest size trainer for 5-10 minutes. Gradually decrease the length of time using the smaller trainer as you increase the length of time using the larger trainer.   Move at your pace ad what's comfortable for you.  Wrapping up your session   Use trainers for 5-10 minutes every other day or 3 times a week   Wash and dry your vaginal trainers after use  Other considerations: Try to approach using vaginal trainers from a place of curiosity instead of judgment - what can my body do today, vs. why can't it do this, or I should be able to do this. Try letting go of that idea that it should be different, and try to meet yourself where you are at, without the pressure to change anything Do you bring your vaginal trainers into PT? Sometimes, bringing your trainers into sessions with your PT and having them talk you through the process while you are in control of the trainer can be helpful. Maybe they can help you find ways to make insertion a bit easier for you, or they can  help remind you to breathe. If you aren't doing this, I definitely recommend talking to a PT about it. Sometimes knowing the physical tools you have can help with the mental game. One thing that can be helpful to do before jumping to dilating is called "cupping". It is just taking your hand and holding your palm to your vulva and breathing. Doing this before doing any type of trainer training can be helpful as it lets you take a second and check in, vs jumping right in. Kind of like a warm up to your workout! Try different tools and see if you like another one more. Some of our patients prefer crystal wands or  plastic trainers to silicone, some prefer starting with a vibrating pelvic wand instead of a trainer, look at different options and see what interests you. You can also try different lubricants. And don't feel as though you need to jump right into inserting anything. The first few times (or minutes of the session) may just be about putting it at the entrance without inserting, and that's ok! We have also had patients find success with an external vibrator on their pubic bone while they use trainers as this can help distract nerves and increase muscle relaxation. This can be helpful to normalize the trainers. Leave the one you are currently using and the one you want to progress to somewhere you see it every day, like the bathroom counter or the bedside table. Seeing them every day can make them less intimidating. The more you do something, the more routine it becomes, so setting a vaginal trainers schedule and sticking to it can help make it less intimidating. Last thing that could be helpful to you is to set yourself up a relaxing environment when you use your vaginal trainers. Play your favorite calming music, light your favorite candle, incense, or turn on your diffuser, wear your coziest t-shirt and socks, prop your legs on pillows, anything that feels like a big exhale. Don't distract yourself with tv or a movie. Stay tuned into your body to help maintain relaxation Listening to relaxing music or meditations can also be helpful. Preferences for guided meditations can be so different from person to person so find one you feel relaxed/safe with!

## 2023-09-04 NOTE — Therapy (Signed)
 OUTPATIENT PHYSICAL THERAPY FEMALE PELVIC TREATMENT   Patient Name: Shelley Figueroa MRN: [id number] DOB:June 01, 1975, 48 y.o., female Today's Date: 09/05/2023  END OF SESSION:  PT End of Session - 09/04/23 1618     Visit Number 3    Date for PT Re-Evaluation 02/12/24    Authorization Type [employer] 10% coinsurance    PT Start Time 1615    PT Stop Time 1655    PT Time Calculation (min) 40 min    Activity Tolerance Patient tolerated treatment well    Behavior During Therapy [facility] for tasks assessed/performed              Past Medical History:  Diagnosis Date   Allergic rhinitis due to pollen    Anemia    with pregnancy   Anxiety    Arthritis    Asthma    Chronic cystitis    COVID-19 virus infection 05/12/2021   Depression    Dyspareunia in female    Dyspnea on exertion    per pt hard to recover after exercise (cardiologist has echo and cardiac CT ordered when schedule is opened up)   Elevated LFTs 08/16/2022   resolved   Essential hypertension    cardioloigst-  dr t. [facility]-- FH premature CAD--- 08-18-2014 normal stress echo   Foot pain 02/15/2022   History of chronic bronchitis    History of HPV infection    remote   History of kidney stones    History of recurrent UTIs    OA (osteoarthritis)    b. hands and b. feet   Pure hypercholesterolemia 09/20/2018   Renal calculus, left    per pt non-obstructive   Past Surgical History:  Procedure Laterality Date   CESAREAN SECTION  2004, 2005   CYSTOSCOPY N/A 11/06/2018   Procedure: CYSTOSCOPY;  Surgeon: Jerene Bears, MD;  Location: [facility];  Service: Gynecology;  Laterality: N/A;   HYSTEROSCOPY WITH D & C N/A 04/27/2022   Procedure: DILATATION AND CURETTAGE /HYSTEROSCOPY/MYOSURE POLYP RESECTION;  Surgeon: Jerene Bears, MD;  Location: [facility];  Service: Gynecology;  Laterality: N/A;   INTRAUTERINE DEVICE (IUD) INSERTION N/A 11/06/2018   Procedure: INTRAUTERINE DEVICE (IUD)  INSERTION WITH ULTRASOUND GUIDANCE;  Surgeon: Jerene Bears, MD;  Location: [facility] [place];  Service: Gynecology;  Laterality: N/A;   INTRAUTERINE DEVICE (IUD) INSERTION N/A 04/27/2022   Procedure: INTRAUTERINE DEVICE (IUD) INSERTION;  Surgeon: Jerene Bears, MD;  Location: [facility] [place];  Service: Gynecology;  Laterality: N/A;   IUD REMOVAL N/A 11/06/2018   Procedure: INTRAUTERINE DEVICE (IUD) REMOVAL;  Surgeon: Jerene Bears, MD;  Location: [facility];  Service: Gynecology;  Laterality: N/A;  Mirena IUD removal and reinsertion with ultrasound guidance.   IUD REMOVAL N/A 04/27/2022   Procedure: INTRAUTERINE DEVICE (IUD) REMOVAL;  Surgeon: Jerene Bears, MD;  Location: [facility];  Service: Gynecology;  Laterality: N/A;   KNEE ARTHROSCOPY Right 1995   with left knee realignment   KNEE SURGERY Left 1991   NASAL SINUS SURGERY  2015   wtih septoplasty   ORIF WRIST FRACTURE Right 07/18/2023   Procedure: OPEN REDUCTION INTERNAL FIXATION (ORIF) WRIST FRACTURE;  Surgeon: Sheral Apley, MD;  Location: [facility];  Service: Orthopedics;  Laterality: Right;   SEPTOPLASTY     TONSILLECTOMY  1987   Patient Active Problem List   Diagnosis Date Noted   Closed fracture of right distal radius 07/18/2023   Elevated  LFTs 08/16/2022   Encounter for removal of intrauterine contraceptive device (IUD) 04/27/2022   Foot pain 02/15/2022   Joint pain in both hands 01/29/2021   Family history of coronary artery disease in father 07/30/2019   Contrast media allergy 07/30/2019   Insomnia 06/15/2018   Hyperlipidemia 04/06/2018   Encounter for annual general medical examination with abnormal findings in adult 12/28/2016   Asthma, chronic 05/28/2014   Essential hypertension 05/28/2014   ADJ DISORDER WITH MIXED ANXIETY & DEPRESSED MOOD 07/15/2009   Allergic rhinitis 07/06/2009    PCP: Doreene Nest, NP  REFERRING PROVIDER: Jerene Bears,  MD   REFERRING DIAG: N94.10 (ICD-10-CM) - Dyspareunia in female  THERAPY DIAG:  Muscle weakness (generalized)  Other lack of coordination  Pelvic pain  Other muscle spasm  Abnormal posture  Rationale for Evaluation and Treatment: Rehabilitation  ONSET DATE: entire life  SUBJECTIVE:                                                                                                                                                                                           SUBJECTIVE STATEMENT: Pt states that she tried uberlube and states that it wasn't slippery enough and almost burned. She did try vitamin E suppository and states that she has not noticed any change.   PAIN:  Are you having pain? Yes NPRS scale: 10/10 Pain location: Vaginal  Pain type: burning, sharp, and throbbing Pain description: intermittent   Aggravating factors: intercourse, vaginal exams, tampon Relieving factors: no vaginal penetration or intimate activity  PRECAUTIONS: None  RED FLAGS: None   WEIGHT BEARING RESTRICTIONS: No  FALLS:  Has patient fallen in last 6 months? No  OCCUPATION: vice president of sales, works for home  ACTIVITY LEVEL : daily walks (1.5 miles), Systems analyst 3x/week 45 minutes   PLOF: Independent  PATIENT GOALS: to have less painful intercourse with partner   PERTINENT HISTORY:  2 c-sections, chronic cystitis, anxiety, asthma, anxiety, multiple IUD placements/removals all very painful  BOWEL MOVEMENT: Pain with bowel movement: No Type of bowel movement:Frequency 1x/day and Strain lately more with pain medications, but the last 8 years she has been much better Fully empty rectum: Yes:   Leakage: No Pads: No Fiber supplement/laxative Yes   URINATION: Pain with urination: No Fully empty bladder: Yes:   Stream: Strong Urgency: Yes  Frequency: every couple of hours Leakage: Laughing - occasional  Pads: No  INTERCOURSE:  Ability to have vaginal penetration  Yes  Pain with intercourse: Initial Penetration, During Penetration, Deep Penetration, and After Intercourse DrynessYes - feels like she will get wet and aroused 1x/month [place]ax: able  to have orgasm, but only with external stimulation Marinoff Scale: 2/3 Lubricant: using a lot of lubricant  PREGNANCY: Vaginal deliveries 0 Tearing No Episiotomy No C-section deliveries 2 Currently pregnant No  PROLAPSE: None   OBJECTIVE:  Note: Objective measures were completed at Evaluation unless otherwise noted. 09/04/23 Abdominal: significant tightness in Rt lower quadrant up to umbilical level                External Perineal Exam: WNL                             Internal Pelvic Floor: tenderness and tightness throughout; more tightness present on Lt compared to Rt; different sensations of uncomfortable pressure and burning - with prolonged pressure burning would start  Patient confirms identification and approves PT to assess internal pelvic floor and treatment Yes  PELVIC [id number]   MMT eval  Vaginal 4/5, 10 second hold, 12 repeats  Diastasis Recti 1.5 finger separation with distortion   (Blank rows = not tested)        TONE: high  PROLAPSE: Mild anterior/posterior vaginal wall laxity   08/28/23:   PATIENT SURVEYS:   PFIQ-7: 43  COGNITION: Overall cognitive status: Within functional limits for tasks assessed     SENSATION: Light touch: Appears intact  FUNCTIONAL TESTS: Squat: WNL Single leg stance:  Rt: pelvic drop  Lt: pelvic drop Curl-up test: deferred   GAIT: Assistive device utilized: None Comments: WNL  POSTURE: rounded shoulders, forward head, decreased lumbar lordosis, increased thoracic kyphosis, and posterior pelvic tilt   LUMBARAROM/PROM: WNL   PALPATION:   General: WNL  Pelvic Alignment: deferred    TODAY'S TREATMENT:                                                                                                                               DATE:  09/04/23 Manual: Pt provides verbal consent for internal vaginal/rectal pelvic floor exam. Internal pelvic floor muscle assessment Superficial and deep pelvic floor muscle release Neuromuscular re-education: Superficial and deep pelvic floor muscle desensitization, Lt>Rt Diaphragmatic breathing and reverse kegel for improved circulation and relaxation  Relaxation body scan  Therapeutic activities: Dilator training - hand out given Discussed lidocaine lubricants     08/28/23 EVAL  Neuromuscular re-education: Pt education on down training and autonomic nervous system  Pt education on pain neuroscience education and role of partner in vaginal sensitization Therapeutic activities: Vaginal moisturizers Vaginal lubricants Pt education on dilators and looked at different sample sets - benefit of high quality dilator  Benefit of vibration of vaginal and vulvar tissues     PATIENT EDUCATION:  Education details: See above Person educated: Patient Education method: Explanation, Demonstration, Tactile cues, Verbal cues, and Handouts Education comprehension: verbalized understanding  HOME EXERCISE PROGRAM: Written handout   ASSESSMENT:  CLINICAL IMPRESSION: Pt has not seen any change since first session. We performed internal pelvic floor muscle exam  today with notable findings of burning and pain throughout superficial and deep pelvic floor, Rt>Lt. We went over how to perform reverse kegel with inhale. Due to tension and pain, do not want her working on any active contractions at this time, but believe reverse kegel will be beneficial for improved relaxation proprioception and improved circulation. We went over dilators and how to begin progressing; hand out given. She will continue to benefit from skilled PT intervention in order to improve pain with intercourse, improve discomfort in vulvar vaginal tissues, and improve quality of life.   OBJECTIVE IMPAIRMENTS: decreased  activity tolerance, decreased coordination, decreased endurance, decreased mobility, decreased strength, increased muscle spasms, impaired tone, postural dysfunction, and pain.   ACTIVITY LIMITATIONS:  vaginal penetration  PARTICIPATION LIMITATIONS: interpersonal relationship and gynecological exams   PERSONAL FACTORS: 3+ comorbidities: medical history  are also affecting patient's functional outcome.   REHAB POTENTIAL: Fair chronic dyspareunia and asexual preference  CLINICAL DECISION MAKING: Evolving/moderate complexity  EVALUATION COMPLEXITY: Moderate   GOALS: Goals reviewed with patient? Yes  SHORT TERM GOALS: Target date: 09/25/2023   Pt will be independent with HEP.   Baseline: Goal status: INITIAL  2.  Pt will report 25% improvement in pain with vaginal penetration.  Baseline:  Goal status: INITIAL  3.  Pt will begin using dilators at least 3x/week in order to start vaginal desensitization program on her own.  Baseline:  Goal status: INITIAL  4.  Pt will perform dialy vaginal moisturization.  Baseline:  Goal status: INITIAL  5.  Pt will be independent with diaphragmatic breathing and down training activities in order to improve pelvic floor relaxation.  Baseline:  Goal status: INITIAL   LONG TERM GOALS: Target date: 02/12/24  Pt will be independent with advanced HEP.   Baseline:  Goal status: INITIAL  2.  Pt will report 50% improvement in pain with vaginal penetration. Baseline:  Goal status: INITIAL  3.  Pt will be independent with dilator activities and progression to larger sizes.  Baseline:  Goal status: INITIAL  4.  Pt will demonstrate normal pelvic floor muscle A/ROM and tissue quality.  Baseline:  Goal status: INITIAL  5.  Pt will be independent with vagal toning activities for improved down training.  Baseline:  Goal status: INITIAL   PLAN:  PT FREQUENCY: 1-2x/week  PT DURATION: 6 months  PLANNED INTERVENTIONS: 97110-Therapeutic  exercises, 97530- Therapeutic activity, 97112- Neuromuscular re-education, 97535- Self Care, 16109- Manual therapy, Dry Needling, and Biofeedback  PLAN FOR NEXT SESSION: Internal vaginal pelvic floor muscle exam; diaphragmatic breathing breathing and initial down training exercises; abdominal scar tissue exam and mobilization.    Julio Alm, PT, DPT04/08/259:29 AM

## 2023-09-05 ENCOUNTER — Ambulatory Visit (INDEPENDENT_AMBULATORY_CARE_PROVIDER_SITE_OTHER): Admitting: Rehabilitative and Restorative Service Providers"

## 2023-09-05 ENCOUNTER — Encounter: Payer: Self-pay | Admitting: Rehabilitative and Restorative Service Providers"

## 2023-09-05 ENCOUNTER — Encounter: Admitting: Rehabilitative and Restorative Service Providers"

## 2023-09-05 DIAGNOSIS — R278 Other lack of coordination: Secondary | ICD-10-CM

## 2023-09-05 DIAGNOSIS — M79641 Pain in right hand: Secondary | ICD-10-CM

## 2023-09-05 DIAGNOSIS — M6281 Muscle weakness (generalized): Secondary | ICD-10-CM | POA: Diagnosis not present

## 2023-09-05 DIAGNOSIS — M25641 Stiffness of right hand, not elsewhere classified: Secondary | ICD-10-CM

## 2023-09-05 DIAGNOSIS — M25531 Pain in right wrist: Secondary | ICD-10-CM

## 2023-09-05 DIAGNOSIS — M25631 Stiffness of right wrist, not elsewhere classified: Secondary | ICD-10-CM | POA: Diagnosis not present

## 2023-09-05 NOTE — Patient Instructions (Signed)

## 2023-09-12 ENCOUNTER — Ambulatory Visit: Payer: 59

## 2023-09-12 DIAGNOSIS — M6281 Muscle weakness (generalized): Secondary | ICD-10-CM

## 2023-09-12 DIAGNOSIS — R102 Pelvic and perineal pain: Secondary | ICD-10-CM

## 2023-09-12 DIAGNOSIS — R293 Abnormal posture: Secondary | ICD-10-CM

## 2023-09-12 DIAGNOSIS — M62838 Other muscle spasm: Secondary | ICD-10-CM

## 2023-09-12 DIAGNOSIS — R278 Other lack of coordination: Secondary | ICD-10-CM

## 2023-09-12 NOTE — Therapy (Signed)
 OUTPATIENT PHYSICAL THERAPY FEMALE PELVIC TREATMENT   Patient Name: Shelley Figueroa MRN: [id number] DOB:June 23, 1975, 48 y.o., female Today's Date: 09/12/2023  END OF SESSION:  PT End of Session - 09/12/23 1621     Visit Number 4    Date for PT Re-Evaluation 02/12/24    Authorization Type [employer] 10% coinsurance    PT Start Time 1615    PT Stop Time 1655    PT Time Calculation (min) 40 min    Activity Tolerance Patient tolerated treatment well    Behavior During Therapy WFL for tasks assessed/performed              Past Medical History:  Diagnosis Date   Allergic rhinitis due to pollen    Anemia    with pregnancy   Anxiety    Arthritis    Asthma    Chronic cystitis    COVID-19 virus infection 05/12/2021   Depression    Dyspareunia in female    Dyspnea on exertion    per pt hard to recover after exercise (cardiologist has echo and cardiac CT ordered when schedule is opened up)   Elevated LFTs 08/16/2022   resolved   Essential hypertension    cardioloigst-  dr t. Theodis Fiscal-- FH premature CAD--- 08-18-2014 normal stress echo   Foot pain 02/15/2022   History of chronic bronchitis    History of HPV infection    remote   History of kidney stones    History of recurrent UTIs    OA (osteoarthritis)    b. hands and b. feet   Pure hypercholesterolemia 09/20/2018   Renal calculus, left    per pt non-obstructive   Past Surgical History:  Procedure Laterality Date   CESAREAN SECTION  2004, 2005   CYSTOSCOPY N/A 11/06/2018   Procedure: CYSTOSCOPY;  Surgeon: Lillian Rein, MD;  Location: [facility];  Service: Gynecology;  Laterality: N/A;   HYSTEROSCOPY WITH D & C N/A 04/27/2022   Procedure: DILATATION AND CURETTAGE /HYSTEROSCOPY/MYOSURE POLYP RESECTION;  Surgeon: Lillian Rein, MD;  Location: [facility];  Service: Gynecology;  Laterality: N/A;   INTRAUTERINE DEVICE (IUD) INSERTION N/A 11/06/2018   Procedure: INTRAUTERINE DEVICE (IUD)  INSERTION WITH ULTRASOUND GUIDANCE;  Surgeon: Lillian Rein, MD;  Location: [facility];  Service: Gynecology;  Laterality: N/A;   INTRAUTERINE DEVICE (IUD) INSERTION N/A 04/27/2022   Procedure: INTRAUTERINE DEVICE (IUD) INSERTION;  Surgeon: Lillian Rein, MD;  Location: [facility];  Service: Gynecology;  Laterality: N/A;   IUD REMOVAL N/A 11/06/2018   Procedure: INTRAUTERINE DEVICE (IUD) REMOVAL;  Surgeon: Lillian Rein, MD;  Location: [facility];  Service: Gynecology;  Laterality: N/A;  Mirena IUD removal and reinsertion with ultrasound guidance.   IUD REMOVAL N/A 04/27/2022   Procedure: INTRAUTERINE DEVICE (IUD) REMOVAL;  Surgeon: Lillian Rein, MD;  Location: [facility];  Service: Gynecology;  Laterality: N/A;   KNEE ARTHROSCOPY Right 1995   with left knee realignment   KNEE SURGERY Left 1991   NASAL SINUS SURGERY  2015   wtih septoplasty   ORIF WRIST FRACTURE Right 07/18/2023   Procedure: OPEN REDUCTION INTERNAL FIXATION (ORIF) WRIST FRACTURE;  Surgeon: Saundra Curl, MD;  Location: [facility];  Service: Orthopedics;  Laterality: Right;   SEPTOPLASTY     TONSILLECTOMY  1987   Patient Active Problem List   Diagnosis Date Noted   Closed fracture of right distal radius 07/18/2023   Elevated  LFTs 08/16/2022   Encounter for removal of intrauterine contraceptive device (IUD) 04/27/2022   Foot pain 02/15/2022   Joint pain in both hands 01/29/2021   Family history of coronary artery disease in father 07/30/2019   Contrast media allergy 07/30/2019   Insomnia 06/15/2018   Hyperlipidemia 04/06/2018   Encounter for annual general medical examination with abnormal findings in adult 12/28/2016   Asthma, chronic 05/28/2014   Essential hypertension 05/28/2014   ADJ DISORDER WITH MIXED ANXIETY & DEPRESSED MOOD 07/15/2009   Allergic rhinitis 07/06/2009    PCP: Gabriel John, NP  REFERRING PROVIDER: Lillian Rein,  MD   REFERRING DIAG: N94.10 (ICD-10-CM) - Dyspareunia in female  THERAPY DIAG:  Muscle weakness (generalized)  Other lack of coordination  Pelvic pain  Abnormal posture  Other muscle spasm  Rationale for Evaluation and Treatment: Rehabilitation  ONSET DATE: entire life  SUBJECTIVE:                                                                                                                                                                                           SUBJECTIVE STATEMENT: Pt states that she felt a little anxious and worn out after pelvic work last week which is how she normally feels after pelvic exams. She has started using first dilator and has no discomfort. She had intercourse several times this week and it was only very painful once. She feels like she differentiates between uncomfortable and pain, but vaginal penetration is never comfortable or enjoyable.   PAIN:  Are you having pain? Yes NPRS scale: 10/10 Pain location: Vaginal  Pain type: burning, sharp, and throbbing Pain description: intermittent   Aggravating factors: intercourse, vaginal exams, tampon Relieving factors: no vaginal penetration or intimate activity  PRECAUTIONS: None  RED FLAGS: None   WEIGHT BEARING RESTRICTIONS: No  FALLS:  Has patient fallen in last 6 months? No  OCCUPATION: vice president of sales, works for home  ACTIVITY LEVEL : daily walks (1.5 miles), Systems analyst 3x/week 45 minutes   PLOF: Independent  PATIENT GOALS: to have less painful intercourse with partner   PERTINENT HISTORY:  2 c-sections, chronic cystitis, anxiety, asthma, anxiety, multiple IUD placements/removals all very painful  BOWEL MOVEMENT: Pain with bowel movement: No Type of bowel movement:Frequency 1x/day and Strain lately more with pain medications, but the last 8 years she has been much better Fully empty rectum: Yes:   Leakage: No Pads: No Fiber supplement/laxative Yes    URINATION: Pain with urination: No Fully empty bladder: Yes:   Stream: Strong Urgency: Yes  Frequency: every couple of hours Leakage: Laughing - occasional  Pads: No  INTERCOURSE:  Ability to have vaginal penetration Yes  Pain with intercourse: Initial Penetration, During Penetration, Deep Penetration, and After Intercourse DrynessYes - feels like she will get wet and aroused 1x/month [place]ax: able to have orgasm, but only with external stimulation Marinoff Scale: 2/3 Lubricant: using a lot of lubricant  PREGNANCY: Vaginal deliveries 0 Tearing No Episiotomy No C-section deliveries 2 Currently pregnant No  PROLAPSE: None   OBJECTIVE:  Note: Objective measures were completed at Evaluation unless otherwise noted. 09/04/23 Abdominal: significant tightness in Rt lower quadrant up to umbilical level                External Perineal Exam: WNL                             Internal Pelvic Floor: tenderness and tightness throughout; more tightness present on Lt compared to Rt; different sensations of uncomfortable pressure and burning - with prolonged pressure burning would start  Patient confirms identification and approves PT to assess internal pelvic floor and treatment Yes  PELVIC NWG:[id number]   MMT eval  Vaginal 4/5, 10 second hold, 12 repeats  Diastasis Recti 1.5 finger separation with distortion   (Blank rows = not tested)        TONE: high  PROLAPSE: Mild anterior/posterior vaginal wall laxity   08/28/23:   PATIENT SURVEYS:   PFIQ-7: 43  COGNITION: Overall cognitive status: Within functional limits for tasks assessed     SENSATION: Light touch: Appears intact  FUNCTIONAL TESTS: Squat: WNL Single leg stance:  Rt: pelvic drop  Lt: pelvic drop Curl-up test: deferred   GAIT: Assistive device utilized: None Comments: WNL  POSTURE: rounded shoulders, forward head, decreased lumbar lordosis, increased thoracic kyphosis, and posterior pelvic  tilt   LUMBARAROM/PROM: WNL   PALPATION:   General: WNL  Pelvic Alignment: deferred    TODAY'S TREATMENT:                                                                                                                              DATE:  09/12/23 Manual: Trigger Point Dry Needling  Initial Treatment: Pt instructed on Dry Needling rational, procedures, and possible side effects. Pt instructed to expect mild to moderate muscle soreness later in the day and/or into the next day.  Pt instructed in methods to reduce muscle soreness. Pt instructed to continue prescribed HEP. Patient was educated on signs and symptoms of infection and other risk factors and advised to seek medical attention should they occur.  Patient verbalized understanding of these instructions and education.   Patient Verbal Consent Given: Yes Education Handout Provided: Yes Muscles Treated: L4-S1 multifidi Electrical Stimulation Performed: No Treatment Response/Outcome: significant twitch response and release, Rt>Lt  Soft tissue mobilization to bil lumbar paraspinals  Neuromuscular re-education: Diaphragmatic breathing  Child's pose 10 breaths Happy baby 10 breaths Butterfly 10 breaths Cat cow 2 x 10 Therapeutic activities: Dilator progression,  start using size 2, progress when not having any discomfort    09/04/23 Manual: Pt provides verbal consent for internal vaginal/rectal pelvic floor exam. Internal pelvic floor muscle assessment Superficial and deep pelvic floor muscle release Neuromuscular re-education: Superficial and deep pelvic floor muscle desensitization, Lt>Rt Diaphragmatic breathing and reverse kegel for improved circulation and relaxation  Relaxation body scan  Therapeutic activities: Dilator training - hand out given Discussed lidocaine lubricants     08/28/23 EVAL  Neuromuscular re-education: Pt education on down training and autonomic nervous system  Pt education on pain  neuroscience education and role of partner in vaginal sensitization Therapeutic activities: Vaginal moisturizers Vaginal lubricants Pt education on dilators and looked at different sample sets - benefit of high quality dilator  Benefit of vibration of vaginal and vulvar tissues     PATIENT EDUCATION:  Education details: See above Person educated: Patient Education method: Explanation, Demonstration, Tactile cues, Verbal cues, and Handouts Education comprehension: verbalized understanding  HOME EXERCISE PROGRAM: [id number]  ASSESSMENT:  CLINICAL IMPRESSION: Pt doing well with starting to work on dilators. We discussed that progress may be slower working through dilators as she is also having painful intercourse, but it will likely still be beneficial especially if husband can work on dilators with her as well. Due to a lot of restriction and pain in her low back, we performed dry needling and soft tissue mobilization to this area to help release pelvic floor as well. We followed this by performing down training activities. We also discussed taking what she is focusing on with active relaxation in standing and work on that during intercourse. She will continue to benefit from skilled PT intervention in order to improve pain with intercourse, improve discomfort in vulvar vaginal tissues, and improve quality of life.   OBJECTIVE IMPAIRMENTS: decreased activity tolerance, decreased coordination, decreased endurance, decreased mobility, decreased strength, increased muscle spasms, impaired tone, postural dysfunction, and pain.   ACTIVITY LIMITATIONS:  vaginal penetration  PARTICIPATION LIMITATIONS: interpersonal relationship and gynecological exams   PERSONAL FACTORS: 3+ comorbidities: medical history  are also affecting patient's functional outcome.   REHAB POTENTIAL: Fair chronic dyspareunia and asexual preference  CLINICAL DECISION MAKING: Evolving/moderate complexity  EVALUATION  COMPLEXITY: Moderate   GOALS: Goals reviewed with patient? Yes  SHORT TERM GOALS: Target date: 09/25/2023   Pt will be independent with HEP.   Baseline: Goal status: INITIAL  2.  Pt will report 25% improvement in pain with vaginal penetration.  Baseline:  Goal status: INITIAL  3.  Pt will begin using dilators at least 3x/week in order to start vaginal desensitization program on her own.  Baseline:  Goal status: INITIAL  4.  Pt will perform dialy vaginal moisturization.  Baseline:  Goal status: INITIAL  5.  Pt will be independent with diaphragmatic breathing and down training activities in order to improve pelvic floor relaxation.  Baseline:  Goal status: INITIAL   LONG TERM GOALS: Target date: 02/12/24  Pt will be independent with advanced HEP.   Baseline:  Goal status: INITIAL  2.  Pt will report 50% improvement in pain with vaginal penetration. Baseline:  Goal status: INITIAL  3.  Pt will be independent with dilator activities and progression to larger sizes.  Baseline:  Goal status: INITIAL  4.  Pt will demonstrate normal pelvic floor muscle A/ROM and tissue quality.  Baseline:  Goal status: INITIAL  5.  Pt will be independent with vagal toning activities for improved down training.  Baseline:  Goal status: INITIAL  PLAN:  PT FREQUENCY: 1-2x/week  PT DURATION: 6 months  PLANNED INTERVENTIONS: 97110-Therapeutic exercises, 97530- Therapeutic activity, 97112- Neuromuscular re-education, 97535- Self Care, [id number]- Manual therapy, Dry Needling, and Biofeedback  PLAN FOR NEXT SESSION: Internal vaginal pelvic floor muscle exam; diaphragmatic breathing breathing and initial down training exercises; abdominal scar tissue exam and mobilization.    Verlena Glenn, PT, DPT04/15/255:07 PM

## 2023-09-12 NOTE — Therapy (Signed)
 OUTPATIENT OCCUPATIONAL THERAPY TREATMENT & progress NOTE  Patient Name: Shelley Figueroa MRN: [id number] DOB:1975/09/02, 48 y.o., female Today's Date: 09/13/2023  PCP: Allayne Gitelman NP  REFERRING PROVIDER: Margarita Rana, MD          OT Progress Note Reporting Period 08/02/2023 to 09/13/2023 09/13/23: She has now met most of her long-term goals, however she still has some weakness of grip and some lingering weakness through her arm.  She also had a recent exacerbation of pain and is concerned that this could happen again.  She would like a few more OT visits to continue working on functional ability and functional strength.  PLAN:  OT FREQUENCY: 1x/week  OT DURATION: 4 weeks through 09/15/2023 - 10/13/23 and up to 11 total visits as needed    See note below for Objective Data and Assessment of Progress/Goals.           END OF SESSION:  OT End of Session - 09/13/23 1017     Visit Number 7    Number of Visits 10    Date for OT Re-Evaluation 10/13/23    Authorization Type [employer]    OT Start Time 1017    OT Stop Time 1100    OT Time Calculation (min) 43 min    Activity Tolerance Patient tolerated treatment well;No increased pain;Patient limited by fatigue;Patient limited by pain    Behavior During Therapy [facility] for tasks assessed/performed              Past Medical History:  Diagnosis Date   Allergic rhinitis due to pollen    Anemia    with pregnancy   Anxiety    Arthritis    Asthma    Chronic cystitis    COVID-19 virus infection 05/12/2021   Depression    Dyspareunia in female    Dyspnea on exertion    per pt hard to recover after exercise (cardiologist has echo and cardiac CT ordered when schedule is opened up)   Elevated LFTs 08/16/2022   resolved   Essential hypertension    cardioloigst-  dr t. [facility]-- FH premature CAD--- 08-18-2014 normal stress echo   Foot pain 02/15/2022   History of chronic bronchitis    History of HPV infection    remote    History of kidney stones    History of recurrent UTIs    OA (osteoarthritis)    b. hands and b. feet   Pure hypercholesterolemia 09/20/2018   Renal calculus, left    per pt non-obstructive   Past Surgical History:  Procedure Laterality Date   CESAREAN SECTION  2004, 2005   CYSTOSCOPY N/A 11/06/2018   Procedure: CYSTOSCOPY;  Surgeon: Jerene Bears, MD;  Location: [facility];  Service: Gynecology;  Laterality: N/A;   HYSTEROSCOPY WITH D & C N/A 04/27/2022   Procedure: DILATATION AND CURETTAGE /HYSTEROSCOPY/MYOSURE POLYP RESECTION;  Surgeon: Jerene Bears, MD;  Location: [facility];  Service: Gynecology;  Laterality: N/A;   INTRAUTERINE DEVICE (IUD) INSERTION N/A 11/06/2018   Procedure: INTRAUTERINE DEVICE (IUD) INSERTION WITH ULTRASOUND GUIDANCE;  Surgeon: Jerene Bears, MD;  Location: [facility] [place];  Service: Gynecology;  Laterality: N/A;   INTRAUTERINE DEVICE (IUD) INSERTION N/A 04/27/2022   Procedure: INTRAUTERINE DEVICE (IUD) INSERTION;  Surgeon: Jerene Bears, MD;  Location: [facility] ;  Service: Gynecology;  Laterality: N/A;   IUD REMOVAL N/A 11/06/2018   Procedure: INTRAUTERINE DEVICE (IUD) REMOVAL;  Surgeon: Jerene Bears, MD;  Location: [facility];  Service: Gynecology;  Laterality: N/A;  Mirena IUD removal and reinsertion with ultrasound guidance.   IUD REMOVAL N/A 04/27/2022   Procedure: INTRAUTERINE DEVICE (IUD) REMOVAL;  Surgeon: Jerene Bears, MD;  Location: [facility];  Service: Gynecology;  Laterality: N/A;   KNEE ARTHROSCOPY Right 1995   with left knee realignment   KNEE SURGERY Left 1991   NASAL SINUS SURGERY  2015   wtih septoplasty   ORIF WRIST FRACTURE Right 07/18/2023   Procedure: OPEN REDUCTION INTERNAL FIXATION (ORIF) WRIST FRACTURE;  Surgeon: Sheral Apley, MD;  Location: [facility];  Service: Orthopedics;  Laterality: Right;   SEPTOPLASTY     TONSILLECTOMY  1987    Patient Active Problem List   Diagnosis Date Noted   Closed fracture of right distal radius 07/18/2023   Elevated LFTs 08/16/2022   Encounter for removal of intrauterine contraceptive device (IUD) 04/27/2022   Foot pain 02/15/2022   Joint pain in both hands 01/29/2021   Family history of coronary artery disease in father 07/30/2019   Contrast media allergy 07/30/2019   Insomnia 06/15/2018   Hyperlipidemia 04/06/2018   Encounter for annual general medical examination with abnormal findings in adult 12/28/2016   Asthma, chronic 05/28/2014   Essential hypertension 05/28/2014   ADJ DISORDER WITH MIXED ANXIETY & DEPRESSED MOOD 07/15/2009   Allergic rhinitis 07/06/2009    ONSET DATE: DOS: 07/18/23, DOI 07/12/23  REFERRING DIAG:  Z61.096,E45.409 (ICD-10-CM) - Joint pain in both hands    THERAPY DIAG:  Muscle weakness (generalized)  Other lack of coordination  Pain in right wrist  Stiffness of right wrist, not elsewhere classified  Stiffness of right hand, not elsewhere classified  Pain in right hand  Rationale for Evaluation and Treatment: Rehabilitation  PERTINENT HISTORY: New fall and distal radius fracture with subsequent surgery  07/18/23. This is being evaluated in OT 08/02/23.   Was previously being seen in OT for hand arthritis and that info is as follows (that POC will be put on hold): Finger fracture in 05/12/2023, chronic arthritis, plantar fasciitis She states she broke her Lt IF on 05/12/23, and it went untreated and has a nonhealing fracture for which she just started wearing a prefabricated metal anger splint.  She also has chronic bil hand pain R> L, primarily in the base of her thumbs as well as her tip joints of her fingers with some overt Heberden's nodes. She states neg for auto-immune issues.  Also here for PT eval for plantar fascitis.   PRECAUTIONS: None; RED FLAGS: None   WEIGHT BEARING RESTRICTIONS: Yes less than 5 pounds in left hand recommended for the  next month and nothing painful   SUBJECTIVE:   SUBJECTIVE STATEMENT: Now approx 8 weeks post-op Rt DRF and ORIF and 8 weeks immobilized Lt IF fx. She states pain is greatly improved, she has been working on more functional activities, and again weaned away from all bracing.  She has been able to perform isometric strengthening.  She states her left hand has no significant problems other than arthritis now.     PAIN:  Are you having pain?   Yes: NPRS scale: constant pain   1/10 on average depending on activity.  Pain location: Rt wrist/thumb Pain description: Aching Aggravating factors: Repetitive or heavy gripping Relieving factors: Rest and heat   PATIENT GOALS: To improve pain in both of her hands and also adequately heal her finger fracture  NEXT MD VISIT: 08/10/2023  OBJECTIVE: (All objective assessments below are from updated evaluation on: 08/02/23 unless otherwise specified.)   HAND DOMINANCE: Right   ADLs: Overall ADLs: States decreased ability to grab, hold household objects, pain and difficulty to open containers, perform FMS tasks (manipulate fasteners on clothing)   FUNCTIONAL OUTCOME MEASURES: 09/13/23: 16 % Quick DASH   08/02/23: Quick DASH: 72% issues now after DRF   07/11/23: Quick DASH 27% impairment today  (Higher % Score  =  More Impairment)     UPPER EXTREMITY ROM     Shoulder to Wrist AROM Right 07/11/23 Left 07/11/23 Right  08/02/23 Rt 08/09/23 Rt 08/22/23 Rt 09/13/23  Forearm supination 87 88 56 72 85   Forearm pronation  80 80 74 84 85   Wrist flexion 71 76 21 28 39 (45 post manual therapy)  58  Wrist extension 73 77 26 29 48 (57 post manual therapy)  62  Rad dev    19    Ulnar dev    28    (Blank rows = not tested)   Hand AROM Bil hands  09/13/23  Full Fist Ability (or Gap to Distal Palmar Crease) Bilateral hands have full fist, full thumb opposition now.  (Blank rows = not tested)   UPPER EXTREMITY MMT:     MMT Right 08/31/23 Rt 09/13/23   Elbow flexion 5/5   Elbow extension 5/5   Forearm supination 4/5 tender 4+/5  Forearm pronation 4+/5 5/5  Wrist flexion 4-/5 tender to thumb 4+/5 thumb tender  Wrist extension 4-/5 tender to ulnar head 5/5  (Blank rows = not tested)  HAND FUNCTION: 09/13/23: Grip: Rt: 39;#; Lt: 75#    09/05/23: Rt grip: 33#   08/31/23: Grip Rt: 37#   08/22/23: Grip Rt: 26.7#,  Lt: 46#   Eval 08/02/23: not tested due to healing fxs   07/11/23:  Observed weakness in affected bil hands.  Grip strength Right: 53.6 lbs tender, Left: NT lbs   COORDINATION: 08/31/23: 9HPT: Rt 24sec WFL today   EDEMA:   08/02/23: Mildly swollen in right distal radius compared to left- 16.8cm right wrist vs 15cm left wrist  OBSERVATIONS:   08/02/23 Eval: Right wrist surgical area has some bruising and ecchymosis, otherwise looks clean and covered by Steri-Strips.  Left index finger distal phalanx largely nontender mild angulation, x-rays seem to show interval healing.   07/11/23 Eval: Some overt Heberden's nodes, tenderness about the CMC joints of the thumb, some overt stiffness especially with broken left index finger   TODAY'S TREATMENT:  09/13/23: Pt performs AROM, gripping, and strength with right hand and wrist against therapist's resistance for exercise/activities as well as new measures today. OT also discusses home and functional tasks with the pt and reviews goals.  She has now met most of her goals, but her grip strength is still a bit weak, and she does have some lingering stiffness in the wrist as well as some decreased endurance. OT also reviews home exercises and provides updated recommendations and upgrades as bolded below. Pt states understanding and tolerates upgrades well.  She also would like to continue with therapy for several more weeks to try to boost her endurance and functional ability to return to all IADL activities.   Exercises - Standing Radial Nerve Glide  - 4-6 x daily - 1 sets - 10-15 reps - HOOK  Stretch  - 4 x daily - 3-5 reps - 15-20 sec hold - Seated Wrist Flexion Stretch  - 3 x daily -  3 reps - 15 hold - Wrist Prayer Stretch  - 3 x daily - 3 reps - 15 sec hold - Stretch Thumb DOWNWARD  - 3 x daily - 3 reps - 15 sec hold - Thumb Webspace Stretch  - 3 x daily - 3 reps - 15 sec hold - Towel Roll Grip with Forearm in Neutral  - 3 x daily - 5 reps - 10 sec hold - Standing Shoulder Row with Anchored Resistance  - 2-4 x daily - 1-2 sets - 10-15 reps - Standing Bicep Curls with Resistance  - 2-4 x daily - 1-2 sets - 10-15 reps - CLX Tricep Pressdowns  - 4-6 x daily - 1 sets - 10-15 reps - Hammer Stretch or Strength   - 2-4 x daily - 1-2 sets - 10-15 reps - Wrist Extension with Resistance  - 2-4 x daily - 1-2 sets - 10-15 reps - Wrist Flexion with Resistance  - 2-4 x daily - 1-2 sets - 10-15 reps   PATIENT EDUCATION: Education details: See tx section above for details  Person educated: Patient Education method: Engineer, structural, Teach back, Handouts  Education comprehension: States and demonstrates understanding, Additional Education required    HOME EXERCISE PROGRAM: OA Program: Access Code: [email or web address] URL: [email or web address] Date: 07/11/2023 Prepared by: Leartis Proud   GOALS: Goals reviewed with patient? Yes   SHORT TERM GOALS: (STG required if POC>30 days) Target Date: 07/28/2023  Pt will obtain protective, custom orthotic. Goal status: 07/11/2023 MET   2.  Pt will demo/state understanding of initial HEP to improve pain levels and prerequisite motion. Goal status: 08/15/2023: Met   LONG TERM GOALS: Target Date: 10/13/2023  Pt will improve functional ability by decreased impairment per Quick DASH assessment from 27% to 15% or better, for better quality of life. Goal status:09/13/23: now 16%   2.  Pt will improve grip strength in right hand from tender 53 lbs to at least nontender 56 lbs for functional use at home and in IADLs. Goal status:09/13/23:  Not MET yet... due to wrist fx   3.  Pt will improve A/ROM in Lt hand full fist from loose fist with index finger extended to at least a tight full fist, to have functional motion for tasks like reach and grasp.  Goal status: 09/13/23: MET in both hands now   4.  Pt will improve A/ROM in left hand index finger total active motion from limited range of motion due to broken DIP joint to at least 210 degrees total active motion, to have functional motion for tasks like reach and grasp.  Goal status:09/13/23: Met on 08/22/23  5.  Pt will improve coordination skills in bilateral hands, as seen by within functional limit score on nine-hole peg testing to have increased functional ability to carry out fine motor tasks (fasteners, etc.) and more complex, coordinated IADLs (meal prep, sports, etc.).  Goal status: 09/13/23: MET  6.  Pt will decrease pain at rest from 3-5/10 to 1-3/10 or better to have better sleep and occupational participation in daily roles. Goal status: 09/13/23: MET  ASSESSMENT:  CLINICAL IMPRESSION: 09/13/23: She has now met most of her long-term goals, however she still has some weakness of grip and some lingering weakness through her arm.  She also had a recent exacerbation of pain and is concerned that this could happen again.  She would like a few more OT visits to continue working on functional ability and functional strength.     PLAN:  OT  FREQUENCY: 1x/week  OT DURATION: 4 weeks through 09/15/2023 - 10/13/23 and up to 11 total visits as needed  PLANNED INTERVENTIONS: 97168 OT Re-evaluation, 97535 self care/ADL training, 16109 therapeutic exercise, 97530 therapeutic activity, 97112 neuromuscular re-education, 97140 manual therapy, 97035 ultrasound, 97039 fluidotherapy, 97010 moist heat, 97010 cryotherapy, 97760 Orthotics management and training, 60454 Splinting (initial encounter), [id number] Subsequent splinting/medication, Dry needling, energy conservation, coping strategies  training, patient/family education, and DME and/or AE instructions   CONSULTED AND AGREED WITH PLAN OF CARE: Patient  PLAN FOR NEXT SESSION:   Reviewed new whole arm strengthening, progress conservatively with wrist support as needed.  Work on specific IADLs as helpful.   Leartis Proud, OTR/L, CHT 09/13/2023, 12:57 PM

## 2023-09-12 NOTE — Patient Instructions (Signed)

## 2023-09-13 ENCOUNTER — Encounter: Payer: Self-pay | Admitting: Rehabilitative and Restorative Service Providers"

## 2023-09-13 ENCOUNTER — Ambulatory Visit (INDEPENDENT_AMBULATORY_CARE_PROVIDER_SITE_OTHER): Admitting: Rehabilitative and Restorative Service Providers"

## 2023-09-13 DIAGNOSIS — M25631 Stiffness of right wrist, not elsewhere classified: Secondary | ICD-10-CM | POA: Diagnosis not present

## 2023-09-13 DIAGNOSIS — M25641 Stiffness of right hand, not elsewhere classified: Secondary | ICD-10-CM

## 2023-09-13 DIAGNOSIS — M25531 Pain in right wrist: Secondary | ICD-10-CM

## 2023-09-13 DIAGNOSIS — R278 Other lack of coordination: Secondary | ICD-10-CM | POA: Diagnosis not present

## 2023-09-13 DIAGNOSIS — M6281 Muscle weakness (generalized): Secondary | ICD-10-CM

## 2023-09-13 DIAGNOSIS — M79641 Pain in right hand: Secondary | ICD-10-CM

## 2023-09-18 ENCOUNTER — Ambulatory Visit: Payer: 59

## 2023-09-18 DIAGNOSIS — R293 Abnormal posture: Secondary | ICD-10-CM

## 2023-09-18 DIAGNOSIS — R279 Unspecified lack of coordination: Secondary | ICD-10-CM

## 2023-09-18 DIAGNOSIS — M6281 Muscle weakness (generalized): Secondary | ICD-10-CM | POA: Diagnosis not present

## 2023-09-18 DIAGNOSIS — M62838 Other muscle spasm: Secondary | ICD-10-CM

## 2023-09-18 NOTE — Therapy (Signed)
 OUTPATIENT PHYSICAL THERAPY FEMALE PELVIC TREATMENT   Patient Name: Shelley Figueroa MRN: [id number] DOB:[birth year], 48 y.o., female Today's Date: 09/18/2023  END OF SESSION:  PT End of Session - 09/18/23 1618     Visit Number 5    Date for PT Re-Evaluation 02/12/24    Authorization Type [employer] 10% coinsurance    PT Start Time 1616    PT Stop Time 1700    PT Time Calculation (min) 44 min    Activity Tolerance Patient tolerated treatment well    Behavior During Therapy [facility] for tasks assessed/performed              Past Medical History:  Diagnosis Date   Allergic rhinitis due to pollen    Anemia    with pregnancy   Anxiety    Arthritis    Asthma    Chronic cystitis    COVID-19 virus infection 05/12/2021   Depression    Dyspareunia in female    Dyspnea on exertion    per pt hard to recover after exercise (cardiologist has echo and cardiac CT ordered when schedule is opened up)   Elevated LFTs 08/16/2022   resolved   Essential hypertension    cardioloigst-  dr t. Theodis Fiscal-- FH premature CAD--- 08-18-2014 normal stress echo   Foot pain 02/15/2022   History of chronic bronchitis    History of HPV infection    remote   History of kidney stones    History of recurrent UTIs    OA (osteoarthritis)    b. hands and b. feet   Pure hypercholesterolemia 09/20/2018   Renal calculus, left    per pt non-obstructive   Past Surgical History:  Procedure Laterality Date   CESAREAN SECTION  2004, 2005   CYSTOSCOPY N/A 11/06/2018   Procedure: CYSTOSCOPY;  Surgeon: Lillian Rein, MD;  Location: [facility] [place];  Service: Gynecology;  Laterality: N/A;   HYSTEROSCOPY WITH D & C N/A 04/27/2022   Procedure: DILATATION AND CURETTAGE /HYSTEROSCOPY/MYOSURE POLYP RESECTION;  Surgeon: Lillian Rein, MD;  Location: [facility];  Service: Gynecology;  Laterality: N/A;   INTRAUTERINE DEVICE (IUD) INSERTION N/A 11/06/2018   Procedure: INTRAUTERINE DEVICE (IUD)  INSERTION WITH ULTRASOUND GUIDANCE;  Surgeon: Lillian Rein, MD;  Location: [facility] [place];  Service: Gynecology;  Laterality: N/A;   INTRAUTERINE DEVICE (IUD) INSERTION N/A 04/27/2022   Procedure: INTRAUTERINE DEVICE (IUD) INSERTION;  Surgeon: Lillian Rein, MD;  Location: [facility] [place];  Service: Gynecology;  Laterality: N/A;   IUD REMOVAL N/A 11/06/2018   Procedure: INTRAUTERINE DEVICE (IUD) REMOVAL;  Surgeon: Lillian Rein, MD;  Location: [facility];  Service: Gynecology;  Laterality: N/A;  Mirena  IUD removal and reinsertion with ultrasound guidance.   IUD REMOVAL N/A 04/27/2022   Procedure: INTRAUTERINE DEVICE (IUD) REMOVAL;  Surgeon: Lillian Rein, MD;  Location: [facility];  Service: Gynecology;  Laterality: N/A;   KNEE ARTHROSCOPY Right 1995   with left knee realignment   KNEE SURGERY Left 1991   NASAL SINUS SURGERY  2015   wtih septoplasty   ORIF WRIST FRACTURE Right 07/18/2023   Procedure: OPEN REDUCTION INTERNAL FIXATION (ORIF) WRIST FRACTURE;  Surgeon: Saundra Curl, MD;  Location: [facility];  Service: Orthopedics;  Laterality: Right;   SEPTOPLASTY     TONSILLECTOMY  1987   Patient Active Problem List   Diagnosis Date Noted   Closed fracture of right distal radius 07/18/2023   Elevated  LFTs 08/16/2022   Encounter for removal of intrauterine contraceptive device (IUD) 04/27/2022   Foot pain 02/15/2022   Joint pain in both hands 01/29/2021   Family history of coronary artery disease in father 07/30/2019   Contrast media allergy 07/30/2019   Insomnia 06/15/2018   Hyperlipidemia 04/06/2018   Encounter for annual general medical examination with abnormal findings in adult 12/28/2016   Asthma, chronic 05/28/2014   Essential hypertension 05/28/2014   ADJ DISORDER WITH MIXED ANXIETY & DEPRESSED MOOD 07/15/2009   Allergic rhinitis 07/06/2009    PCP: Gabriel John, NP  REFERRING PROVIDER: Lillian Rein,  MD   REFERRING DIAG: N94.10 (ICD-10-CM) - Dyspareunia in female  THERAPY DIAG:  Muscle weakness (generalized)  Other muscle spasm  Unspecified lack of coordination  Abnormal posture  Rationale for Evaluation and Treatment: Rehabilitation  ONSET DATE: entire life  SUBJECTIVE:                                                                                                                                                                                           SUBJECTIVE STATEMENT: Pt states that she got a good nights sleep last night for the first time in over a month. She started working with the level 2 dilator and she notices that the most uncomfortable place is right at the opening when she tilts the dilator from side to side.   PAIN:  Are you having pain? Yes NPRS scale: 10/10 Pain location: Vaginal  Pain type: burning, sharp, and throbbing Pain description: intermittent   Aggravating factors: intercourse, vaginal exams, tampon Relieving factors: no vaginal penetration or intimate activity  PRECAUTIONS: None  RED FLAGS: None   WEIGHT BEARING RESTRICTIONS: No  FALLS:  Has patient fallen in last 6 months? No  OCCUPATION: vice president of sales, works for home  ACTIVITY LEVEL : daily walks (1.5 miles), Systems analyst 3x/week 45 minutes   PLOF: Independent  PATIENT GOALS: to have less painful intercourse with partner   PERTINENT HISTORY:  2 c-sections, chronic cystitis, anxiety, asthma, anxiety, multiple IUD placements/removals all very painful  BOWEL MOVEMENT: Pain with bowel movement: No Type of bowel movement:Frequency 1x/day and Strain lately more with pain medications, but the last 8 years she has been much better Fully empty rectum: Yes:   Leakage: No Pads: No Fiber supplement/laxative Yes   URINATION: Pain with urination: No Fully empty bladder: Yes:   Stream: Strong Urgency: Yes  Frequency: every couple of hours Leakage: Laughing -  occasional  Pads: No  INTERCOURSE:  Ability to have vaginal penetration Yes  Pain with intercourse: Initial Penetration, During Penetration, Deep Penetration, and  After Intercourse DrynessYes - feels like she will get wet and aroused 1x/month [place]: able to have orgasm, but only with external stimulation Marinoff Scale: 2/3 Lubricant: using a lot of lubricant  PREGNANCY: Vaginal deliveries 0 Tearing No Episiotomy No C-section deliveries 2 Currently pregnant No  PROLAPSE: None   OBJECTIVE:  Note: Objective measures were completed at Evaluation unless otherwise noted. 09/04/23 Abdominal: significant tightness in Rt lower quadrant up to umbilical level                External Perineal Exam: WNL                             Internal Pelvic Floor: tenderness and tightness throughout; more tightness present on Lt compared to Rt; different sensations of uncomfortable pressure and burning - with prolonged pressure burning would start  Patient confirms identification and approves PT to assess internal pelvic floor and treatment Yes  PELVIC [id number]   MMT eval  Vaginal 4/5, 10 second hold, 12 repeats  Diastasis Recti 1.5 finger separation with distortion   (Blank rows = not tested)        TONE: high  PROLAPSE: Mild anterior/posterior vaginal wall laxity   08/28/23:   PATIENT SURVEYS:   PFIQ-7: 43  COGNITION: Overall cognitive status: Within functional limits for tasks assessed     SENSATION: Light touch: Appears intact  FUNCTIONAL TESTS: Squat: WNL Single leg stance:  Rt: pelvic drop  Lt: pelvic drop Curl-up test: deferred   GAIT: Assistive device utilized: None Comments: WNL  POSTURE: rounded shoulders, forward head, decreased lumbar lordosis, increased thoracic kyphosis, and posterior pelvic tilt   LUMBARAROM/PROM: WNL   PALPATION:   General: WNL  Pelvic Alignment: deferred    TODAY'S TREATMENT:                                                                                                                               DATE:  09/18/23 Manual: Pt provides verbal consent for internal vaginal/rectal pelvic floor exam. In supine: Myofascial release using 2 fingers to perineal body and bil transverse perineal with skin rolling and pulling/pushing technique Bil levator ani (mid muscle) release, Lt>Rt Bil adductor soft tissue mobilization  Therapeutic activities: Progressing to size 3 dilator after using size 2 in same session Work on introital pain not allowing pain to get above 2 points higher than baseline on VAS Pt education on dry needling to the perineal body to help release fascial restriction   09/12/23 Manual: Trigger Point Dry Needling  Initial Treatment: Pt instructed on Dry Needling rational, procedures, and possible side effects. Pt instructed to expect mild to moderate muscle soreness later in the day and/or into the next day.  Pt instructed in methods to reduce muscle soreness. Pt instructed to continue prescribed HEP. Patient was educated on signs and symptoms of infection and other risk factors and advised to seek medical attention  should they occur.  Patient verbalized understanding of these instructions and education.   Patient Verbal Consent Given: Yes Education Handout Provided: Yes Muscles Treated: L4-S1 multifidi Electrical Stimulation Performed: No Treatment Response/Outcome: significant twitch response and release, Rt>Lt  Soft tissue mobilization to bil lumbar paraspinals  Neuromuscular re-education: Diaphragmatic breathing  Child's pose 10 breaths Happy baby 10 breaths Butterfly 10 breaths Cat cow 2 x 10 Therapeutic activities: Dilator progression, start using size 2, progress when not having any discomfort    09/04/23 Manual: Pt provides verbal consent for internal vaginal/rectal pelvic floor exam. Internal pelvic floor muscle assessment Superficial and deep pelvic floor muscle  release Neuromuscular re-education: Superficial and deep pelvic floor muscle desensitization, Lt>Rt Diaphragmatic breathing and reverse kegel for improved circulation and relaxation  Relaxation body scan  Therapeutic activities: Dilator training - hand out given Discussed lidocaine  lubricants      PATIENT EDUCATION:  Education details: See above Person educated: Patient Education method: Explanation, Demonstration, Tactile cues, Verbal cues, and Handouts Education comprehension: verbalized understanding  HOME EXERCISE PROGRAM: [id number]  ASSESSMENT:  CLINICAL IMPRESSION: Pt continuing to do well with dilators, but reports her most significant pain is at entrance. She demonstrated the most sensitivity and pain in this area during internal techniques today with feeling raw and stinging/burning. This continues to seem hormonal, but there is also restriction here that we performed various myofascial techniques on with some improvement. Lt mid-muscle puborectalis most restricted and trigger point palpated. She tolerated release techniques to this area well. Due to fascial restriction superficially and adductor tightness, soft tissue mobilization performed to bil adductors with good tolerance but some discomfort. We did discuss working on moving up to 3rd dilator as her comfort allows. She will continue to benefit from skilled PT intervention in order to improve pain with intercourse, improve discomfort in vulvar vaginal tissues, and improve quality of life.   OBJECTIVE IMPAIRMENTS: decreased activity tolerance, decreased coordination, decreased endurance, decreased mobility, decreased strength, increased muscle spasms, impaired tone, postural dysfunction, and pain.   ACTIVITY LIMITATIONS:  vaginal penetration  PARTICIPATION LIMITATIONS: interpersonal relationship and gynecological exams   PERSONAL FACTORS: 3+ comorbidities: medical history  are also affecting patient's functional outcome.    REHAB POTENTIAL: Fair chronic dyspareunia and asexual preference  CLINICAL DECISION MAKING: Evolving/moderate complexity  EVALUATION COMPLEXITY: Moderate   GOALS: Goals reviewed with patient? Yes  SHORT TERM GOALS: Target date: 09/25/2023   Pt will be independent with HEP.   Baseline: Goal status: INITIAL  2.  Pt will report 25% improvement in pain with vaginal penetration.  Baseline:  Goal status: INITIAL  3.  Pt will begin using dilators at least 3x/week in order to start vaginal desensitization program on her own.  Baseline:  Goal status: INITIAL  4.  Pt will perform dialy vaginal moisturization.  Baseline:  Goal status: INITIAL  5.  Pt will be independent with diaphragmatic breathing and down training activities in order to improve pelvic floor relaxation.  Baseline:  Goal status: INITIAL   LONG TERM GOALS: Target date: 02/12/24  Pt will be independent with advanced HEP.   Baseline:  Goal status: INITIAL  2.  Pt will report 50% improvement in pain with vaginal penetration. Baseline:  Goal status: INITIAL  3.  Pt will be independent with dilator activities and progression to larger sizes.  Baseline:  Goal status: INITIAL  4.  Pt will demonstrate normal pelvic floor muscle A/ROM and tissue quality.  Baseline:  Goal status: INITIAL  5.  Pt will  be independent with vagal toning activities for improved down training.  Baseline:  Goal status: INITIAL   PLAN:  PT FREQUENCY: 1-2x/week  PT DURATION: 6 months  PLANNED INTERVENTIONS: 97110-Therapeutic exercises, 97530- Therapeutic activity, 97112- Neuromuscular re-education, 97535- Self Care, [id number]- Manual therapy, Dry Needling, and Biofeedback  PLAN FOR NEXT SESSION: Internal vaginal pelvic floor muscle exam; diaphragmatic breathing breathing and initial down training exercises; abdominal scar tissue exam and mobilization.    Verlena Glenn, PT, DPT04/21/255:08 PM

## 2023-09-26 NOTE — Therapy (Signed)
 OUTPATIENT OCCUPATIONAL THERAPY TREATMENT NOTE  Patient Name: Shelley Figueroa MRN: [id number] DOB:[birth year], 48 y.o., female Today's Date: 09/26/2023  PCP: Tresea Frost NP  REFERRING PROVIDER: Randal Bury, MD     END OF SESSION:     Past Medical History:  Diagnosis Date   Allergic rhinitis due to pollen    Anemia    with pregnancy   Anxiety    Arthritis    Asthma    Chronic cystitis    COVID-19 virus infection 05/12/2021   Depression    Dyspareunia in female    Dyspnea on exertion    per pt hard to recover after exercise (cardiologist has echo and cardiac CT ordered when schedule is opened up)   Elevated LFTs 08/16/2022   resolved   Essential hypertension    cardioloigst-  dr t. Theodis Fiscal-- FH premature CAD--- 08-18-2014 normal stress echo   Foot pain 02/15/2022   History of chronic bronchitis    History of HPV infection    remote   History of kidney stones    History of recurrent UTIs    OA (osteoarthritis)    b. hands and b. feet   Pure hypercholesterolemia 09/20/2018   Renal calculus, left    per pt non-obstructive   Past Surgical History:  Procedure Laterality Date   CESAREAN SECTION  2004, 2005   CYSTOSCOPY N/A 11/06/2018   Procedure: CYSTOSCOPY;  Surgeon: Lillian Rein, MD;  Location: [facility];  Service: Gynecology;  Laterality: N/A;   HYSTEROSCOPY WITH D & C N/A 04/27/2022   Procedure: DILATATION AND CURETTAGE /HYSTEROSCOPY/MYOSURE POLYP RESECTION;  Surgeon: Lillian Rein, MD;  Location: [facility];  Service: Gynecology;  Laterality: N/A;   INTRAUTERINE DEVICE (IUD) INSERTION N/A 11/06/2018   Procedure: INTRAUTERINE DEVICE (IUD) INSERTION WITH ULTRASOUND GUIDANCE;  Surgeon: Lillian Rein, MD;  Location: [facility] [place];  Service: Gynecology;  Laterality: N/A;   INTRAUTERINE DEVICE (IUD) INSERTION N/A 04/27/2022   Procedure: INTRAUTERINE DEVICE (IUD) INSERTION;  Surgeon: Lillian Rein, MD;  Location: [facility]  [place];  Service: Gynecology;  Laterality: N/A;   IUD REMOVAL N/A 11/06/2018   Procedure: INTRAUTERINE DEVICE (IUD) REMOVAL;  Surgeon: Lillian Rein, MD;  Location: [facility];  Service: Gynecology;  Laterality: N/A;  Mirena  IUD removal and reinsertion with ultrasound guidance.   IUD REMOVAL N/A 04/27/2022   Procedure: INTRAUTERINE DEVICE (IUD) REMOVAL;  Surgeon: Lillian Rein, MD;  Location: [facility];  Service: Gynecology;  Laterality: N/A;   KNEE ARTHROSCOPY Right 1995   with left knee realignment   KNEE SURGERY Left 1991   NASAL SINUS SURGERY  2015   wtih septoplasty   ORIF WRIST FRACTURE Right 07/18/2023   Procedure: OPEN REDUCTION INTERNAL FIXATION (ORIF) WRIST FRACTURE;  Surgeon: Saundra Curl, MD;  Location: [facility];  Service: Orthopedics;  Laterality: Right;   SEPTOPLASTY     TONSILLECTOMY  1987   Patient Active Problem List   Diagnosis Date Noted   Closed fracture of right distal radius 07/18/2023   Elevated LFTs 08/16/2022   Encounter for removal of intrauterine contraceptive device (IUD) 04/27/2022   Foot pain 02/15/2022   Joint pain in both hands 01/29/2021   Family history of coronary artery disease in father 07/30/2019   Contrast media allergy 07/30/2019   Insomnia 06/15/2018   Hyperlipidemia 04/06/2018   Encounter for annual general medical examination with abnormal findings in adult 12/28/2016   Asthma, chronic 05/28/2014  Essential hypertension 05/28/2014   ADJ DISORDER WITH MIXED ANXIETY & DEPRESSED MOOD 07/15/2009   Allergic rhinitis 07/06/2009    ONSET DATE: DOS: 07/18/23, DOI 07/12/23  REFERRING DIAG:  B14.782,N56.213 (ICD-10-CM) - Joint pain in both hands    THERAPY DIAG:  No diagnosis found.  Rationale for Evaluation and Treatment: Rehabilitation  PERTINENT HISTORY: New fall and distal radius fracture with subsequent surgery  07/18/23. This is being evaluated in OT 08/02/23.   Was previously being seen  in OT for hand arthritis and that info is as follows (that POC will be put on hold): Finger fracture in 05/12/2023, chronic arthritis, plantar fasciitis She states she broke her Lt IF on 05/12/23, and it went untreated and has a nonhealing fracture for which she just started wearing a prefabricated metal anger splint.  She also has chronic bil hand pain R> L, primarily in the base of her thumbs as well as her tip joints of her fingers with some overt Heberden's nodes. She states neg for auto-immune issues.  Also here for PT eval for plantar fascitis.   PRECAUTIONS: None; RED FLAGS: None   WEIGHT BEARING RESTRICTIONS: Yes less than 5 pounds in left hand recommended for the next month and nothing painful   SUBJECTIVE:   SUBJECTIVE STATEMENT: Now approx 10 weeks post-op Rt DRF and ORIF.  She states ***   pain is greatly improved, she has been working on more functional activities, and again weaned away from all bracing.  She has been able to perform isometric strengthening.  She states her left hand has no significant problems other than arthritis now.     PAIN:  Are you having pain?   Yes: NPRS scale: constant pain  *** 1/10 on average depending on activity.  Pain location: Rt wrist/thumb Pain description: Aching Aggravating factors: Repetitive or heavy gripping Relieving factors: Rest and heat   PATIENT GOALS: To improve pain in both of her hands and also adequately heal her finger fracture  NEXT MD VISIT: 08/10/2023    OBJECTIVE: (All objective assessments below are from updated evaluation on: 08/02/23 unless otherwise specified.)   HAND DOMINANCE: Right   ADLs: Overall ADLs: States decreased ability to grab, hold household objects, pain and difficulty to open containers, perform FMS tasks (manipulate fasteners on clothing)   FUNCTIONAL OUTCOME MEASURES: 09/13/23: 16 % Quick DASH   08/02/23: Quick DASH: 72% issues now after DRF   07/11/23: Quick DASH 27% impairment today   (Higher % Score  =  More Impairment)     UPPER EXTREMITY ROM     Shoulder to Wrist AROM Right 07/11/23 Left 07/11/23 Right  08/02/23 Rt 08/09/23 Rt 08/22/23 Rt 09/13/23 Rt 09/27/23  Forearm supination 87 88 56 72 85    Forearm pronation  80 80 74 84 85    Wrist flexion 71 76 21 28 39 (45 post manual therapy)  58 ***  Wrist extension 73 77 26 29 48 (57 post manual therapy)  62 ***  Rad dev    19     Ulnar dev    28     (Blank rows = not tested)   Hand AROM Bil hands  09/13/23  Full Fist Ability (or Gap to Distal Palmar Crease) Bilateral hands have full fist, full thumb opposition now.  (Blank rows = not tested)   UPPER EXTREMITY MMT:     MMT Right 08/31/23 Rt 09/13/23  Elbow flexion 5/5   Elbow extension 5/5   Forearm supination 4/5 tender  4+/5  Forearm pronation 4+/5 5/5  Wrist flexion 4-/5 tender to thumb 4+/5 thumb tender  Wrist extension 4-/5 tender to ulnar head 5/5  (Blank rows = not tested)  HAND FUNCTION: 09/27/23: Grip Rt: ***#   09/13/23: Grip: Rt: 39;#; Lt: 75#    09/05/23: Rt grip: 33#   08/31/23: Grip Rt: 37#   08/22/23: Grip Rt: 26.7#,  Lt: 46#   Eval 08/02/23: not tested due to healing fxs   07/11/23:  Observed weakness in affected bil hands.  Grip strength Right: 53.6 lbs tender, Left: NT lbs   COORDINATION: 08/31/23: 9HPT: Rt 24sec WFL today   EDEMA:   08/02/23: Mildly swollen in right distal radius compared to left- 16.8cm right wrist vs 15cm left wrist  OBSERVATIONS:   08/02/23 Eval: Right wrist surgical area has some bruising and ecchymosis, otherwise looks clean and covered by Steri-Strips.  Left index finger distal phalanx largely nontender mild angulation, x-rays seem to show interval healing.   07/11/23 Eval: Some overt Heberden's nodes, tenderness about the CMC joints of the thumb, some overt stiffness especially with broken left index finger   TODAY'S TREATMENT:  09/27/23: *** Reviewed new whole arm strengthening, progress conservatively with wrist  support as needed.  Work on specific IADLs as helpful.   Exercises - Standing Radial Nerve Glide  - 4-6 x daily - 1 sets - 10-15 reps - HOOK Stretch  - 4 x daily - 3-5 reps - 15-20 sec hold - Seated Wrist Flexion Stretch  - 3 x daily - 3 reps - 15 hold - Wrist Prayer Stretch  - 3 x daily - 3 reps - 15 sec hold - Stretch Thumb DOWNWARD  - 3 x daily - 3 reps - 15 sec hold - Thumb Webspace Stretch  - 3 x daily - 3 reps - 15 sec hold - Towel Roll Grip with Forearm in Neutral  - 3 x daily - 5 reps - 10 sec hold - Standing Shoulder Row with Anchored Resistance  - 2-4 x daily - 1-2 sets - 10-15 reps - Standing Bicep Curls with Resistance  - 2-4 x daily - 1-2 sets - 10-15 reps - CLX Tricep Pressdowns  - 4-6 x daily - 1 sets - 10-15 reps - Hammer Stretch or Strength   - 2-4 x daily - 1-2 sets - 10-15 reps - Wrist Extension with Resistance  - 2-4 x daily - 1-2 sets - 10-15 reps - Wrist Flexion with Resistance  - 2-4 x daily - 1-2 sets - 10-15 reps   PATIENT EDUCATION: Education details: See tx section above for details  Person educated: Patient Education method: Engineer, structural, Teach back, Handouts  Education comprehension: States and demonstrates understanding, Additional Education required    HOME EXERCISE PROGRAM: OA Program: Access Code: [email or web address] URL: [email or web address] Date: 07/11/2023 Prepared by: Leartis Proud   GOALS: Goals reviewed with patient? Yes   SHORT TERM GOALS: (STG required if POC>30 days) Target Date: 07/28/2023  Pt will obtain protective, custom orthotic. Goal status: 07/11/2023 MET   2.  Pt will demo/state understanding of initial HEP to improve pain levels and prerequisite motion. Goal status: 08/15/2023: Met   LONG TERM GOALS: Target Date: 10/13/2023  Pt will improve functional ability by decreased impairment per Quick DASH assessment from 27% to 15% or better, for better quality of life. Goal status:09/13/23: now 16%   2.  Pt will  improve grip strength in right hand from tender 53 lbs to at least nontender 56  lbs for functional use at home and in IADLs. Goal status:09/13/23: Not MET yet... due to wrist fx   3.  Pt will improve A/ROM in Lt hand full fist from loose fist with index finger extended to at least a tight full fist, to have functional motion for tasks like reach and grasp.  Goal status: 09/13/23: MET in both hands now   4.  Pt will improve A/ROM in left hand index finger total active motion from limited range of motion due to broken DIP joint to at least 210 degrees total active motion, to have functional motion for tasks like reach and grasp.  Goal status:09/13/23: Met on 08/22/23  5.  Pt will improve coordination skills in bilateral hands, as seen by within functional limit score on nine-hole peg testing to have increased functional ability to carry out fine motor tasks (fasteners, etc.) and more complex, coordinated IADLs (meal prep, sports, etc.).  Goal status: 09/13/23: MET  6.  Pt will decrease pain at rest from 3-5/10 to 1-3/10 or better to have better sleep and occupational participation in daily roles. Goal status: 09/13/23: MET  ASSESSMENT:  CLINICAL IMPRESSION: 09/27/23: ***  09/13/23: She has now met most of her long-term goals, however she still has some weakness of grip and some lingering weakness through her arm.  She also had a recent exacerbation of pain and is concerned that this could happen again.  She would like a few more OT visits to continue working on functional ability and functional strength.     PLAN:  OT FREQUENCY: 1x/week  OT DURATION: 4 weeks through 09/15/2023 - 10/13/23 and up to 11 total visits as needed  PLANNED INTERVENTIONS: 97168 OT Re-evaluation, 97535 self care/ADL training, 95284 therapeutic exercise, 97530 therapeutic activity, 97112 neuromuscular re-education, 97140 manual therapy, 97035 ultrasound, 97039 fluidotherapy, 97010 moist heat, 97010 cryotherapy, 97760  Orthotics management and training, 13244 Splinting (initial encounter), [id number] Subsequent splinting/medication, Dry needling, energy conservation, coping strategies training, patient/family education, and DME and/or AE instructions   CONSULTED AND AGREED WITH PLAN OF CARE: Patient  PLAN FOR NEXT SESSION:   ***  Leartis Proud, OTR/L, CHT 09/26/2023, 8:27 AM

## 2023-09-27 ENCOUNTER — Encounter: Payer: Self-pay | Admitting: Rehabilitative and Restorative Service Providers"

## 2023-09-27 ENCOUNTER — Ambulatory Visit (INDEPENDENT_AMBULATORY_CARE_PROVIDER_SITE_OTHER): Admitting: Rehabilitative and Restorative Service Providers"

## 2023-09-27 DIAGNOSIS — R278 Other lack of coordination: Secondary | ICD-10-CM

## 2023-09-27 DIAGNOSIS — M25641 Stiffness of right hand, not elsewhere classified: Secondary | ICD-10-CM | POA: Diagnosis not present

## 2023-09-27 DIAGNOSIS — M6281 Muscle weakness (generalized): Secondary | ICD-10-CM

## 2023-09-27 DIAGNOSIS — M25631 Stiffness of right wrist, not elsewhere classified: Secondary | ICD-10-CM

## 2023-09-27 DIAGNOSIS — M25531 Pain in right wrist: Secondary | ICD-10-CM

## 2023-09-27 DIAGNOSIS — M79641 Pain in right hand: Secondary | ICD-10-CM

## 2023-10-05 ENCOUNTER — Encounter: Admitting: Rehabilitative and Restorative Service Providers"

## 2023-10-12 ENCOUNTER — Encounter: Admitting: Rehabilitative and Restorative Service Providers"

## 2023-10-18 ENCOUNTER — Encounter: Admitting: Rehabilitative and Restorative Service Providers"

## 2023-10-19 ENCOUNTER — Ambulatory Visit: Payer: Self-pay | Attending: Obstetrics & Gynecology

## 2023-10-19 DIAGNOSIS — M6281 Muscle weakness (generalized): Secondary | ICD-10-CM | POA: Insufficient documentation

## 2023-10-19 DIAGNOSIS — M62838 Other muscle spasm: Secondary | ICD-10-CM | POA: Diagnosis present

## 2023-10-19 DIAGNOSIS — R102 Pelvic and perineal pain: Secondary | ICD-10-CM | POA: Diagnosis present

## 2023-10-19 DIAGNOSIS — R278 Other lack of coordination: Secondary | ICD-10-CM | POA: Diagnosis present

## 2023-10-19 NOTE — Therapy (Signed)
 OUTPATIENT PHYSICAL THERAPY FEMALE PELVIC TREATMENT   Patient Name: Shelley Figueroa MRN: [id number] DOB:08/06/1975, 48 y.o., female Today's Date: 10/19/2023  END OF SESSION:  PT End of Session - 10/19/23 1236     Visit Number 5    Date for PT Re-Evaluation 02/12/24    Authorization Type [employer] 10% coinsurance    PT Start Time 1233    PT Stop Time 1311    PT Time Calculation (min) 38 min    Activity Tolerance Patient tolerated treatment well    Behavior During Therapy WFL for tasks assessed/performed              Past Medical History:  Diagnosis Date   Allergic rhinitis due to pollen    Anemia    with pregnancy   Anxiety    Arthritis    Asthma    Chronic cystitis    COVID-19 virus infection 05/12/2021   Depression    Dyspareunia in female    Dyspnea on exertion    per pt hard to recover after exercise (cardiologist has echo and cardiac CT ordered when schedule is opened up)   Elevated LFTs 08/16/2022   resolved   Essential hypertension    cardioloigst-  dr t. Theodis Fiscal-- FH premature CAD--- 08-18-2014 normal stress echo   Foot pain 02/15/2022   History of chronic bronchitis    History of HPV infection    remote   History of kidney stones    History of recurrent UTIs    OA (osteoarthritis)    b. hands and b. feet   Pure hypercholesterolemia 09/20/2018   Renal calculus, left    per pt non-obstructive   Past Surgical History:  Procedure Laterality Date   CESAREAN SECTION  2004, 2005   CYSTOSCOPY N/A 11/06/2018   Procedure: CYSTOSCOPY;  Surgeon: Lillian Rein, MD;  Location: [facility] [place];  Service: Gynecology;  Laterality: N/A;   HYSTEROSCOPY WITH D & C N/A 04/27/2022   Procedure: DILATATION AND CURETTAGE /HYSTEROSCOPY/MYOSURE POLYP RESECTION;  Surgeon: Lillian Rein, MD;  Location: [facility];  Service: Gynecology;  Laterality: N/A;   INTRAUTERINE DEVICE (IUD) INSERTION N/A 11/06/2018   Procedure: INTRAUTERINE DEVICE (IUD)  INSERTION WITH ULTRASOUND GUIDANCE;  Surgeon: Lillian Rein, MD;  Location: [facility] [place];  Service: Gynecology;  Laterality: N/A;   INTRAUTERINE DEVICE (IUD) INSERTION N/A 04/27/2022   Procedure: INTRAUTERINE DEVICE (IUD) INSERTION;  Surgeon: Lillian Rein, MD;  Location: [facility] [place];  Service: Gynecology;  Laterality: N/A;   IUD REMOVAL N/A 11/06/2018   Procedure: INTRAUTERINE DEVICE (IUD) REMOVAL;  Surgeon: Lillian Rein, MD;  Location: [facility];  Service: Gynecology;  Laterality: N/A;  Mirena  IUD removal and reinsertion with ultrasound guidance.   IUD REMOVAL N/A 04/27/2022   Procedure: INTRAUTERINE DEVICE (IUD) REMOVAL;  Surgeon: Lillian Rein, MD;  Location: [facility];  Service: Gynecology;  Laterality: N/A;   KNEE ARTHROSCOPY Right 1995   with left knee realignment   KNEE SURGERY Left 1991   NASAL SINUS SURGERY  2015   wtih septoplasty   ORIF WRIST FRACTURE Right 07/18/2023   Procedure: OPEN REDUCTION INTERNAL FIXATION (ORIF) WRIST FRACTURE;  Surgeon: Saundra Curl, MD;  Location: [facility];  Service: Orthopedics;  Laterality: Right;   SEPTOPLASTY     TONSILLECTOMY  1987   Patient Active Problem List   Diagnosis Date Noted   Closed fracture of right distal radius 07/18/2023   Elevated  LFTs 08/16/2022   Encounter for removal of intrauterine contraceptive device (IUD) 04/27/2022   Foot pain 02/15/2022   Joint pain in both hands 01/29/2021   Family history of coronary artery disease in father 07/30/2019   Contrast media allergy 07/30/2019   Insomnia 06/15/2018   Hyperlipidemia 04/06/2018   Encounter for annual general medical examination with abnormal findings in adult 12/28/2016   Asthma, chronic 05/28/2014   Essential hypertension 05/28/2014   ADJ DISORDER WITH MIXED ANXIETY & DEPRESSED MOOD 07/15/2009   Allergic rhinitis 07/06/2009    PCP: Gabriel John, NP  REFERRING PROVIDER: Lillian Rein,  MD   REFERRING DIAG: N94.10 (ICD-10-CM) - Dyspareunia in female  THERAPY DIAG:  Muscle weakness (generalized)  Other lack of coordination  Pelvic pain  Other muscle spasm  Rationale for Evaluation and Treatment: Rehabilitation  ONSET DATE: entire life  SUBJECTIVE:                                                                                                                                                                                           SUBJECTIVE STATEMENT: Pt states that she has had a lot of stress in her life and has not had a chance to work on HEP much in the last month. She has worked with dilators once. She feels like things may be a little bit better. Her sleep is improving. She finds the mindfulness very helpful and her watch is telling her that her sleep quality is better. She is noticing significant dryness - she's not sure if it's more than she's had or if she is just more aware of it.   PAIN:  Are you having pain? Yes NPRS scale: 10/10 Pain location: Vaginal  Pain type: burning, sharp, and throbbing Pain description: intermittent   Aggravating factors: intercourse, vaginal exams, tampon Relieving factors: no vaginal penetration or intimate activity  PRECAUTIONS: None  RED FLAGS: None   WEIGHT BEARING RESTRICTIONS: No  FALLS:  Has patient fallen in last 6 months? No  OCCUPATION: vice president of sales, works for home  ACTIVITY LEVEL : daily walks (1.5 miles), Systems analyst 3x/week 45 minutes   PLOF: Independent  PATIENT GOALS: to have less painful intercourse with partner   PERTINENT HISTORY:  2 c-sections, chronic cystitis, anxiety, asthma, anxiety, multiple IUD placements/removals all very painful  BOWEL MOVEMENT: Pain with bowel movement: No Type of bowel movement:Frequency 1x/day and Strain lately more with pain medications, but the last 8 years she has been much better Fully empty rectum: Yes:   Leakage: No Pads: No Fiber  supplement/laxative Yes   URINATION: Pain with urination: No Fully empty  bladder: Yes:   Stream: Strong Urgency: Yes  Frequency: every couple of hours Leakage: Laughing - occasional  Pads: No  INTERCOURSE:  Ability to have vaginal penetration Yes  Pain with intercourse: Initial Penetration, During Penetration, Deep Penetration, and After Intercourse DrynessYes - feels like she will get wet and aroused 1x/month Climax: able to have orgasm, but only with external stimulation Marinoff Scale: 2/3 Lubricant: using a lot of lubricant  PREGNANCY: Vaginal deliveries 0 Tearing No Episiotomy No C-section deliveries 2 Currently pregnant No  PROLAPSE: None   OBJECTIVE:  Note: Objective measures were completed at Evaluation unless otherwise noted. 09/04/23 Abdominal: significant tightness in Rt lower quadrant up to umbilical level                External Perineal Exam: WNL                             Internal Pelvic Floor: tenderness and tightness throughout; more tightness present on Lt compared to Rt; different sensations of uncomfortable pressure and burning - with prolonged pressure burning would start  Patient confirms identification and approves PT to assess internal pelvic floor and treatment Yes  PELVIC ZOX:[id number]   MMT eval  Vaginal 4/5, 10 second hold, 12 repeats  Diastasis Recti 1.5 finger separation with distortion   (Blank rows = not tested)        TONE: high  PROLAPSE: Mild anterior/posterior vaginal wall laxity   08/28/23:   PATIENT SURVEYS:   PFIQ-7: 43  COGNITION: Overall cognitive status: Within functional limits for tasks assessed     SENSATION: Light touch: Appears intact  FUNCTIONAL TESTS: Squat: WNL Single leg stance:  Rt: pelvic drop  Lt: pelvic drop Curl-up test: deferred   GAIT: Assistive device utilized: None Comments: WNL  POSTURE: rounded shoulders, forward head, decreased lumbar lordosis, increased thoracic kyphosis, and  posterior pelvic tilt   LUMBARAROM/PROM: WNL   PALPATION:   General: WNL  Pelvic Alignment: deferred    TODAY'S TREATMENT:                                                                                                                              DATE:  10/19/23 Neuromuscular re-education: Diaphragmatic breathing Supine with 10lb wt: 3 seconds x 4, 4 seconds x 4, 5 seconds x 4 Supine green band around lower rib cage: 3 seconds x 4, 4 seconds x 4, 5 seconds x 4 Side lying lower rib cage pressure and quick stretch to help improve lateral rib cage expansion Exercises: Seated lateral side bending 10 breaths bil Sitting P stretch with additional rotation and overhead reach 10 breaths bil Therapeutic activities: External/internal vaginal moisturizers - samples given Dilator review Discussed progress and how mindfulness has been helping sleep and giving her more stress relief, reviewing efficacy of various HEP items   09/18/23 Manual: Pt provides verbal consent for internal vaginal/rectal pelvic floor exam. In supine:  Myofascial release using 2 fingers to perineal body and bil transverse perineal with skin rolling and pulling/pushing technique Bil levator ani (mid muscle) release, Lt>Rt Bil adductor soft tissue mobilization  Therapeutic activities: Progressing to size 3 dilator after using size 2 in same session Work on introital pain not allowing pain to get above 2 points higher than baseline on VAS Pt education on dry needling to the perineal body to help release fascial restriction   09/12/23 Manual: Trigger Point Dry Needling  Initial Treatment: Pt instructed on Dry Needling rational, procedures, and possible side effects. Pt instructed to expect mild to moderate muscle soreness later in the day and/or into the next day.  Pt instructed in methods to reduce muscle soreness. Pt instructed to continue prescribed HEP. Patient was educated on signs and symptoms of infection  and other risk factors and advised to seek medical attention should they occur.  Patient verbalized understanding of these instructions and education.   Patient Verbal Consent Given: Yes Education Handout Provided: Yes Muscles Treated: L4-S1 multifidi Electrical Stimulation Performed: No Treatment Response/Outcome: significant twitch response and release, Rt>Lt  Soft tissue mobilization to bil lumbar paraspinals  Neuromuscular re-education: Diaphragmatic breathing  Child's pose 10 breaths Happy baby 10 breaths Butterfly 10 breaths Cat cow 2 x 10 Therapeutic activities: Dilator progression, start using size 2, progress when not having any discomfort      PATIENT EDUCATION:  Education details: See above Person educated: Patient Education method: Programmer, multimedia, Demonstration, Tactile cues, Verbal cues, and Handouts Education comprehension: verbalized understanding  HOME EXERCISE PROGRAM: [phone]  ASSESSMENT:  CLINICAL IMPRESSION: Pt has had very difficult month, but even with this she feels like she has made good progress with overall stress reduction, improved sleep quality, and several positive episodes of intimacy with partner. Today we focused on diaphragmatic breathing with weight, band, and manual pressure feedback in order to help improve rib cage expansion. 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 She did very well with these techniques, but demosntrates more difficulty and restriction on Lt side compared to Rt. She went over vaginal moisturizers again and samples given this session. She will continue to benefit from skilled PT intervention in order to improve pain with intercourse, improve discomfort in vulvar vaginal tissues, and improve quality of life.   OBJECTIVE IMPAIRMENTS: decreased activity tolerance, decreased coordination, decreased endurance, decreased  mobility, decreased strength, increased muscle spasms, impaired tone, postural dysfunction, and pain.   ACTIVITY LIMITATIONS: vaginal penetration  PARTICIPATION LIMITATIONS: interpersonal relationship and gynecological exams   PERSONAL FACTORS: 3+ comorbidities: medical history are also affecting patient's functional outcome.   REHAB POTENTIAL: Fair chronic dyspareunia and asexual preference  CLINICAL DECISION MAKING: Evolving/moderate complexity  EVALUATION COMPLEXITY: Moderate   GOALS: Goals reviewed with patient? Yes  SHORT TERM GOALS: Target date: 09/25/2023 - updated 10/19/23   Pt will be independent with HEP.   Baseline: Goal status: MET 10/19/23  2.  Pt will report 25% improvement in pain with vaginal penetration.  Baseline:  Goal status: IN PROGRESS 10/19/23  3.  Pt will begin using dilators at least 3x/week in order to start vaginal desensitization program on her own.  Baseline:  Goal status: IN PROGRESS 10/19/23  4.  Pt will perform dialy vaginal moisturization.  Baseline:  Goal status: IN PROGRESS 10/19/23  5.  Pt will be independent with diaphragmatic breathing and down training activities in order to improve  pelvic floor relaxation.  Baseline:  Goal status: IN PROGRESS 10/19/23   LONG TERM GOALS: Target date: 02/12/24 - updated 10/19/23  Pt will be independent with advanced HEP.   Baseline:  Goal status: IN PROGRESS 10/19/23  2.  Pt will report 50% improvement in pain with vaginal penetration. Baseline:  Goal status: IN PROGRESS 10/19/23  3.  Pt will be independent with dilator activities and progression to larger sizes.  Baseline:  Goal status: IN PROGRESS 10/19/23  4.  Pt will demonstrate normal pelvic floor muscle A/ROM and tissue quality.  Baseline:  Goal status: IN PROGRESS 10/19/23  5.  Pt will be independent with vagal toning activities for improved down training.  Baseline:  Goal status: IN PROGRESS 10/19/23   PLAN:  PT FREQUENCY:  1-2x/week  PT DURATION: 6 months  PLANNED INTERVENTIONS: 97110-Therapeutic exercises, 97530- Therapeutic activity, 97112- Neuromuscular re-education, 97535- Self Care, 16109- Manual therapy, Dry Needling, and Biofeedback  PLAN FOR NEXT SESSION: Internal vaginal pelvic floor muscle exam; diaphragmatic breathing breathing and initial down training exercises; abdominal scar tissue exam and mobilization.    Verlena Glenn, PT, DPT05/22/251:31 PM

## 2023-10-19 NOTE — Patient Instructions (Signed)

## 2023-10-25 ENCOUNTER — Other Ambulatory Visit: Payer: Self-pay | Admitting: Physician Assistant

## 2023-11-09 ENCOUNTER — Ambulatory Visit: Payer: Self-pay | Attending: Obstetrics & Gynecology

## 2023-11-09 DIAGNOSIS — M62838 Other muscle spasm: Secondary | ICD-10-CM | POA: Diagnosis present

## 2023-11-09 DIAGNOSIS — M6281 Muscle weakness (generalized): Secondary | ICD-10-CM | POA: Diagnosis present

## 2023-11-09 DIAGNOSIS — R278 Other lack of coordination: Secondary | ICD-10-CM | POA: Insufficient documentation

## 2023-11-09 DIAGNOSIS — R102 Pelvic and perineal pain: Secondary | ICD-10-CM | POA: Insufficient documentation

## 2023-11-09 NOTE — Therapy (Signed)
 OUTPATIENT PHYSICAL THERAPY FEMALE PELVIC TREATMENT   Patient Name: Shelley Figueroa MRN: [id number] DOB:10/19/1975, 48 y.o., female Today's Date: 11/09/2023  END OF SESSION:  PT End of Session - 11/09/23 1405     Visit Number 6    Date for PT Re-Evaluation 02/12/24    Authorization Type [employer] 10% coinsurance    PT Start Time 1404    PT Stop Time 1442    PT Time Calculation (min) 38 min    Activity Tolerance Patient tolerated treatment well    Behavior During Therapy WFL for tasks assessed/performed           Past Medical History:  Diagnosis Date   Allergic rhinitis due to pollen    Anemia    with pregnancy   Anxiety    Arthritis    Asthma    Chronic cystitis    COVID-19 virus infection 05/12/2021   Depression    Dyspareunia in female    Dyspnea on exertion    per pt hard to recover after exercise (cardiologist has echo and cardiac CT ordered when schedule is opened up)   Elevated LFTs 08/16/2022   resolved   Essential hypertension    cardioloigst-  dr t. Theodis Fiscal-- FH premature CAD--- 08-18-2014 normal stress echo   Foot pain 02/15/2022   History of chronic bronchitis    History of HPV infection    remote   History of kidney stones    History of recurrent UTIs    OA (osteoarthritis)    b. hands and b. feet   Pure hypercholesterolemia 09/20/2018   Renal calculus, left    per pt non-obstructive   Past Surgical History:  Procedure Laterality Date   CESAREAN SECTION  2004, 2005   CYSTOSCOPY N/A 11/06/2018   Procedure: CYSTOSCOPY;  Surgeon: Lillian Rein, MD;  Location: [facility];  Service: Gynecology;  Laterality: N/A;   HYSTEROSCOPY WITH D & C N/A 04/27/2022   Procedure: DILATATION AND CURETTAGE /HYSTEROSCOPY/MYOSURE POLYP RESECTION;  Surgeon: Lillian Rein, MD;  Location: [facility];  Service: Gynecology;  Laterality: N/A;   INTRAUTERINE DEVICE (IUD) INSERTION N/A 11/06/2018   Procedure: INTRAUTERINE DEVICE (IUD) INSERTION  WITH ULTRASOUND GUIDANCE;  Surgeon: Lillian Rein, MD;  Location: [facility];  Service: Gynecology;  Laterality: N/A;   INTRAUTERINE DEVICE (IUD) INSERTION N/A 04/27/2022   Procedure: INTRAUTERINE DEVICE (IUD) INSERTION;  Surgeon: Lillian Rein, MD;  Location: [facility];  Service: Gynecology;  Laterality: N/A;   IUD REMOVAL N/A 11/06/2018   Procedure: INTRAUTERINE DEVICE (IUD) REMOVAL;  Surgeon: Lillian Rein, MD;  Location: [facility];  Service: Gynecology;  Laterality: N/A;  Mirena  IUD removal and reinsertion with ultrasound guidance.   IUD REMOVAL N/A 04/27/2022   Procedure: INTRAUTERINE DEVICE (IUD) REMOVAL;  Surgeon: Lillian Rein, MD;  Location: [facility];  Service: Gynecology;  Laterality: N/A;   KNEE ARTHROSCOPY Right 1995   with left knee realignment   KNEE SURGERY Left 1991   NASAL SINUS SURGERY  2015   wtih septoplasty   ORIF WRIST FRACTURE Right 07/18/2023   Procedure: OPEN REDUCTION INTERNAL FIXATION (ORIF) WRIST FRACTURE;  Surgeon: Saundra Curl, MD;  Location: [facility];  Service: Orthopedics;  Laterality: Right;   SEPTOPLASTY     TONSILLECTOMY  1987   Patient Active Problem List   Diagnosis Date Noted   Closed fracture of right distal radius 07/18/2023   Elevated LFTs 08/16/2022  Encounter for removal of intrauterine contraceptive device (IUD) 04/27/2022   Foot pain 02/15/2022   Joint pain in both hands 01/29/2021   Family history of coronary artery disease in father 07/30/2019   Contrast media allergy 07/30/2019   Insomnia 06/15/2018   Hyperlipidemia 04/06/2018   Encounter for annual general medical examination with abnormal findings in adult 12/28/2016   Asthma, chronic 05/28/2014   Essential hypertension 05/28/2014   ADJ DISORDER WITH MIXED ANXIETY & DEPRESSED MOOD 07/15/2009   Allergic rhinitis 07/06/2009    PCP: Gabriel John, NP  REFERRING PROVIDER: Lillian Rein, MD   REFERRING  DIAG: N94.10 (ICD-10-CM) - Dyspareunia in female  THERAPY DIAG:  Muscle weakness (generalized)  Other lack of coordination  Pelvic pain  Other muscle spasm  Rationale for Evaluation and Treatment: Rehabilitation  ONSET DATE: entire life  SUBJECTIVE:                                                                                                                                                                                           SUBJECTIVE STATEMENT: Pt has had very stressful week. She had more painful intercourse due to not having used estrogen cream or worked with dilator for a week. However, she had pain free intercourse last night and was able to have an orgasm. She has found several of the vaginal moisturizers very helpful (medicine mamma and one other with tea tree that she cannot remember the name of). She is using the 4th dilator now. She states that she cannot get her rib cage to move.   PAIN:  Are you having pain? Yes NPRS scale: 10/10 Pain location: Vaginal  Pain type: burning, sharp, and throbbing Pain description: intermittent   Aggravating factors: intercourse, vaginal exams, tampon Relieving factors: no vaginal penetration or intimate activity  PRECAUTIONS: None  RED FLAGS: None   WEIGHT BEARING RESTRICTIONS: No  FALLS:  Has patient fallen in last 6 months? No  OCCUPATION: vice president of sales, works for home  ACTIVITY LEVEL : daily walks (1.5 miles), Systems analyst 3x/week 45 minutes   PLOF: Independent  PATIENT GOALS: to have less painful intercourse with partner   PERTINENT HISTORY:  2 c-sections, chronic cystitis, anxiety, asthma, anxiety, multiple IUD placements/removals all very painful  BOWEL MOVEMENT: Pain with bowel movement: No Type of bowel movement:Frequency 1x/day and Strain lately more with pain medications, but the last 8 years she has been much better Fully empty rectum: Yes:   Leakage: No Pads: No Fiber  supplement/laxative Yes   URINATION: Pain with urination: No Fully empty bladder: Yes:   Stream: Strong Urgency: Yes  Frequency: every  couple of hours Leakage: Laughing - occasional  Pads: No  INTERCOURSE:  Ability to have vaginal penetration Yes  Pain with intercourse: Initial Penetration, During Penetration, Deep Penetration, and After Intercourse DrynessYes - feels like she will get wet and aroused 1x/month Climax: able to have orgasm, but only with external stimulation Marinoff Scale: 2/3 Lubricant: using a lot of lubricant  PREGNANCY: Vaginal deliveries 0 Tearing No Episiotomy No C-section deliveries 2 Currently pregnant No  PROLAPSE: None   OBJECTIVE:  Note: Objective measures were completed at Evaluation unless otherwise noted. 09/04/23 Abdominal: significant tightness in Rt lower quadrant up to umbilical level                External Perineal Exam: WNL                             Internal Pelvic Floor: tenderness and tightness throughout; more tightness present on Lt compared to Rt; different sensations of uncomfortable pressure and burning - with prolonged pressure burning would start  Patient confirms identification and approves PT to assess internal pelvic floor and treatment Yes  PELVIC WUJ:[id number]   MMT eval  Vaginal 4/5, 10 second hold, 12 repeats  Diastasis Recti 1.5 finger separation with distortion   (Blank rows = not tested)        TONE: high  PROLAPSE: Mild anterior/posterior vaginal wall laxity   08/28/23:   PATIENT SURVEYS:   PFIQ-7: 43  COGNITION: Overall cognitive status: Within functional limits for tasks assessed     SENSATION: Light touch: Appears intact  FUNCTIONAL TESTS: Squat: WNL Single leg stance:  Rt: pelvic drop  Lt: pelvic drop Curl-up test: deferred   GAIT: Assistive device utilized: None Comments: WNL  POSTURE: rounded shoulders, forward head, decreased lumbar lordosis, increased thoracic kyphosis, and  posterior pelvic tilt   LUMBARAROM/PROM: WNL   PALPATION:   General: WNL  Pelvic Alignment: deferred    TODAY'S TREATMENT:                                                                                                                              DATE:  11/09/23 Manual: Soft tissue mobilization to thoracic/lumbar paraspinals  PA mobilizations to lower thoracic spine grade 1-2 Exercises: Seated thoracic extensions 2 x 10 Seated open books 10x bil  10/19/23 Neuromuscular re-education: Diaphragmatic breathing Supine with 10lb wt: 3 seconds x 4, 4 seconds x 4, 5 seconds x 4 Supine green band around lower rib cage: 3 seconds x 4, 4 seconds x 4, 5 seconds x 4 Side lying lower rib cage pressure and quick stretch to help improve lateral rib cage expansion Exercises: Seated lateral side bending 10 breaths bil Sitting P stretch with additional rotation and overhead reach 10 breaths bil Therapeutic activities: External/internal vaginal moisturizers - samples given Dilator review Discussed progress and how mindfulness has been helping sleep and giving her more stress relief, reviewing efficacy of  various HEP items   09/18/23 Manual: Pt provides verbal consent for internal vaginal/rectal pelvic floor exam. In supine: Myofascial release using 2 fingers to perineal body and bil transverse perineal with skin rolling and pulling/pushing technique Bil levator ani (mid muscle) release, Lt>Rt Bil adductor soft tissue mobilization  Therapeutic activities: Progressing to size 3 dilator after using size 2 in same session Work on introital pain not allowing pain to get above 2 points higher than baseline on VAS Pt education on dry needling to the perineal body to help release fascial restriction      PATIENT EDUCATION:  Education details: See above Person educated: Patient Education method: Programmer, multimedia, Demonstration, Tactile cues, Verbal cues, and Handouts Education comprehension:  verbalized understanding  HOME EXERCISE PROGRAM: [id number]  ASSESSMENT:  CLINICAL IMPRESSION: Pt had a very difficult week last week, but is doing better this week and had episode of pain free intercourse. She is using 4th dilator without issues, demonstrating some good progress. Because she is having some difficulty with breathing, we performed manual techniques to thoracic/lumbar spine and musculature in order to help her expand better with breathing in order to achieve better pelvic floor muscle relaxation/movement. She tolerated all techniques well but demonstrated a lot of tenderness, Lt>Rt. She will continue to benefit from skilled PT intervention in order to improve pain with intercourse, improve discomfort in vulvar vaginal tissues, and improve quality of life.   OBJECTIVE IMPAIRMENTS: decreased activity tolerance, decreased coordination, decreased endurance, decreased mobility, decreased strength, increased muscle spasms, impaired tone, postural dysfunction, and pain.   ACTIVITY LIMITATIONS: vaginal penetration  PARTICIPATION LIMITATIONS: interpersonal relationship and gynecological exams   PERSONAL FACTORS: 3+ comorbidities: medical history are also affecting patient's functional outcome.   REHAB POTENTIAL: Fair chronic dyspareunia and asexual preference  CLINICAL DECISION MAKING: Evolving/moderate complexity  EVALUATION COMPLEXITY: Moderate   GOALS: Goals reviewed with patient? Yes  SHORT TERM GOALS: Target date: 09/25/2023 - updated 10/19/23   Pt will be independent with HEP.   Baseline: Goal status: MET 10/19/23  2.  Pt will report 25% improvement in pain with vaginal penetration.  Baseline:  Goal status: IN PROGRESS 10/19/23  3.  Pt will begin using dilators at least 3x/week in order to start vaginal desensitization program on her own.  Baseline:  Goal status: IN PROGRESS 10/19/23  4.  Pt will perform dialy vaginal moisturization.  Baseline:  Goal status: IN  PROGRESS 10/19/23  5.  Pt will be independent with diaphragmatic breathing and down training activities in order to improve pelvic floor relaxation.  Baseline:  Goal status: IN PROGRESS 10/19/23   LONG TERM GOALS: Target date: 02/12/24 - updated 10/19/23  Pt will be independent with advanced HEP.   Baseline:  Goal status: IN PROGRESS 10/19/23  2.  Pt will report 50% improvement in pain with vaginal penetration. Baseline:  Goal status: IN PROGRESS 10/19/23  3.  Pt will be independent with dilator activities and progression to larger sizes.  Baseline:  Goal status: IN PROGRESS 10/19/23  4.  Pt will demonstrate normal pelvic floor muscle A/ROM and tissue quality.  Baseline:  Goal status: IN PROGRESS 10/19/23  5.  Pt will be independent with vagal toning activities for improved down training.  Baseline:  Goal status: IN PROGRESS 10/19/23   PLAN:  PT FREQUENCY: 1-2x/week  PT DURATION: 6 months  PLANNED INTERVENTIONS: 97110-Therapeutic exercises, 97530- Therapeutic activity, 97112- Neuromuscular re-education, 97535- Self Care, 21308- Manual therapy, Dry Needling, and Biofeedback  PLAN FOR NEXT SESSION: Internal vaginal pelvic  floor muscle exam; diaphragmatic breathing breathing and initial down training exercises; abdominal scar tissue exam and mobilization.    Verlena Glenn, PT, DPT06/12/252:45 PM

## 2023-11-15 ENCOUNTER — Encounter: Payer: Self-pay | Admitting: Physician Assistant

## 2023-11-15 ENCOUNTER — Ambulatory Visit: Payer: 59 | Admitting: Physician Assistant

## 2023-11-15 DIAGNOSIS — F3342 Major depressive disorder, recurrent, in full remission: Secondary | ICD-10-CM | POA: Diagnosis not present

## 2023-11-15 DIAGNOSIS — G4700 Insomnia, unspecified: Secondary | ICD-10-CM

## 2023-11-15 DIAGNOSIS — F411 Generalized anxiety disorder: Secondary | ICD-10-CM | POA: Diagnosis not present

## 2023-11-15 MED ORDER — ALPRAZOLAM 0.25 MG PO TABS: 0.1250 mg | ORAL_TABLET | Freq: Two times a day (BID) | ORAL | 1 refills | Status: DC | PRN

## 2023-11-15 MED ORDER — ESZOPICLONE 3 MG PO TABS: 3.0000 mg | ORAL_TABLET | Freq: Every evening | ORAL | 1 refills | Status: DC | PRN

## 2023-11-15 MED ORDER — BUPROPION HCL ER (XL) 300 MG PO TB24: 300.0000 mg | ORAL_TABLET | Freq: Every day | ORAL | 1 refills | Status: DC

## 2023-11-15 NOTE — Progress Notes (Unsigned)
 [facility] Med Check  Patient ID: Shelley Figueroa,  MRN: [id number]  PCP: Gabriel John, NP  Date of Evaluation: 11/15/2023 Time spent:20 minutes  Chief Complaint:  Chief Complaint   Anxiety; Depression; Insomnia; Follow-up    HISTORY/CURRENT STATUS: HPI For routine med check.   Still having sleep issues, but better than it has been.  We tried Sonata  at the LOV.   Wasn't helpful or caused intolerable SE.  Current regimen is working pretty well.  Now gets 6-7.5 hours, just has trouble falling asleep.  She got a [place] watch shows more stress during the night. It's like her mind won't turn off. She's started meditating, puts down her phone earlier in the evening.  Those things seem to be beneficial. Has had more recent life stressors, had to have roof replaced. Her Mom had a stroke a few weeks ago. Is home but that's been stressful.   Patient is able to enjoy things.  She is not as motivated as she has been in the past.  States her therapist thinks she may have ADHD.  She does things when she has bursts of energy.  She has learned to live with that all these years but she does wonder if she has that diagnosis.  One of her sons does have ADHD.  Even if she is diagnosed with that she does not really want to add another medication at this time.  Work is going well.   No extreme sadness, tearfulness, or feelings of hopelessness.  ADLs and personal hygiene are normal.   Appetite has not changed.  No manic symptoms, delirium, or psychosis.  Up until she broke her wrist she had been exercising regularly.  Now that she has recovered fully she is starting back with her routine exercise.  No SI/HI.  Denies dizziness, syncope, seizures, numbness, tingling, tremor, tics, unsteady gait, slurred speech, confusion. Denies muscle or joint pain, stiffness, or dystonia.  Individual Medical History/ Review of Systems: Changes? :Yes    fx wrist in Feb. Had surgery.  Healed well. Dx w/ OA.  Past  medications for mental health diagnoses include: Ambien caused amnesia, Lunesta , Wellbutrin , Xanax , Melatonin causes h/a, Mirtazapine  Allergies: Penicillins, Clindamycin, Codeine, Contrast media [iodinated contrast media], Lisinopril , Hydrocodone -acetaminophen , Oxycodone -acetaminophen , Doxycycline , Erythromycin, Iohexol , and Neomycin  Current Medications:  Current Outpatient Medications:    albuterol  (VENTOLIN  HFA) 108 (90 Base) MCG/ACT inhaler, Inhale 2 puffs into the lungs every 6 (six) hours as needed for wheezing or shortness of breath., Disp: 18 g, Rfl: 0   azelastine (ASTELIN) 0.1 % nasal spray, Place 1 spray into both nostrils in the morning. Use in each nostril as directed, Disp: , Rfl:    benzonatate  (TESSALON ) 100 MG capsule, Take 1 capsule (100 mg total) by mouth 2 (two) times daily as needed for cough., Disp: 20 capsule, Rfl: 0   cetirizine (ZYRTEC) 10 MG tablet, Take 10 mg by mouth at bedtime., Disp: , Rfl:    EPINEPHrine  0.3 mg/0.3 mL IJ SOAJ injection, Inject 0.3 mg into the muscle as needed for anaphylaxis., Disp: , Rfl:    estradiol  (ESTRACE ) 0.1 MG/GM vaginal cream, Place 1 g vaginally 3 (three) times a week. 1 gram vaginally twice weekly, Disp: 42.5 g, Rfl: 3   FLOVENT HFA 44 MCG/ACT inhaler, Inhale 2 puffs into the lungs 2 (two) times daily., Disp: , Rfl:    fluticasone (FLONASE SENSIMIST) 27.5 MCG/SPRAY nasal spray, Place 1 spray into the nose daily., Disp: , Rfl:    L-Lysine HCl  1000 MG TABS, Take 1,000 mg by mouth daily as needed (Mouth sores)., Disp: , Rfl:    melatonin 1 MG TABS tablet, Take 1-3 mg by mouth at bedtime as needed., Disp: , Rfl:    naproxen  sodium (ALEVE ) 220 MG tablet, Take 220 mg by mouth daily as needed (Pain)., Disp: , Rfl:    Spacer/Aero-Holding Chambers (OPTICHAMBER DIAMOND) MISC, See admin instructions., Disp: , Rfl:    valACYclovir (VALTREX) 500 MG tablet, Take 1,000 mg by mouth 3 (three) times daily as needed. , Disp: , Rfl:     valsartan -hydrochlorothiazide  (DIOVAN -HCT) 160-25 MG tablet, TAKE 1 TABLET BY MOUTH EVERY DAY, Disp: 90 tablet, Rfl: 1   ALPRAZolam  (XANAX ) 0.25 MG tablet, Take 0.5-2 tablets (0.125-0.5 mg total) by mouth 2 (two) times daily as needed for anxiety., Disp: 180 tablet, Rfl: 1   aspirin  EC 81 MG tablet, Take 1 tablet (81 mg total) by mouth 2 (two) times daily. To prevent blood clots for 30 days after surgery. (Patient not taking: Reported on 11/15/2023), Disp: 60 tablet, Rfl: 0   buPROPion  (WELLBUTRIN  XL) 300 MG 24 hr tablet, Take 1 tablet (300 mg total) by mouth daily., Disp: 90 tablet, Rfl: 1   Eszopiclone  3 MG TABS, Take 1 tablet (3 mg total) by mouth at bedtime as needed. Take immediately before bedtime, Disp: 90 tablet, Rfl: 1 Medication Side Effects: none  Family Medical/ Social History: Changes?  Mom had a stroke a few weeks ago but is home and recovering.   MENTAL HEALTH EXAM:  There were no vitals taken for this visit.There is no height or weight on file to calculate BMI.  General Appearance: Casual, Neat and Well Groomed  Eye Contact:  Good  Speech:  Clear and Coherent  Volume:  Normal  Mood:  Euthymic  Affect:  Appropriate  Thought Process:  Goal Directed and Descriptions of Associations: Circumstantial  Orientation:  Full (Time, Place, and Person)  Thought Content: Logical   Suicidal Thoughts:  No  Homicidal Thoughts:  No  Memory:  WNL  Judgement:  Good  Insight:  Good  Psychomotor Activity:  Normal  Concentration:  Concentration: Good and Attention Span: Fair  Recall:  Good  Fund of Knowledge: Good  Language: Good  Assets:  Communication Skills Desire for Improvement Financial Resources/Insurance Housing Physical Health Resilience Transportation Vocational/Educational  ADL's:  Intact  Cognition: WNL  Prognosis:  Good   DIAGNOSES:    ICD-10-CM   1. Recurrent major depressive disorder, in full remission (HCC)  F33.42     2. Insomnia, unspecified type  G47.00      3. Generalized anxiety disorder  F41.1      Receiving Psychotherapy: No      RECOMMENDATIONS:  PDMP was reviewed.  Last Xanax  was filled 05/03/2023.  Lunesta  filled 09/12/2023. I provided approximately 20 minutes of face to face time during this encounter, including time spent before and after the visit in records review, medical decision making, counseling pertinent to today's visit, and charting.   She brought remainder of Sonata  to me, I've given it to Print production planner to dispose of properly.   Sleep hygiene discussed.  She is doing well for the most part on her current treatment so no changes will be made at this time.  We discussed trazodone including benefits, risk and side effects.  If she would like to try it prior to her next appointment she can call.  We can send it in, 100 mg, 1/2-2 p.o. nightly.  It is  possible that she has ADHD.  She could go to [place] attention specialist for evaluation but we agree to not go that route right now, since she does not want to be treated with medication anyway.  She has learned to live with it.  She does not take the Xanax  often but a stimulant would be contraindicated if she was taking the Xanax  routinely, due to the fact that one is an upper and the other is a downer.  Continue Xanax  0.25 mg, 1/2-1 twice daily as needed.   Continue Wellbutrin  XL 300 mg 1 p.o. daily. Continue Lunesta  3 mg, 1 p.o. nightly. Continue melatonin 1 to 3 mg nightly as needed. Continue healthy lifestyle choices.  Continue counseling. Return in 6 months.  Marvia Slocumb, PA-C

## 2023-11-16 ENCOUNTER — Ambulatory Visit: Payer: Self-pay

## 2023-11-16 DIAGNOSIS — M62838 Other muscle spasm: Secondary | ICD-10-CM

## 2023-11-16 DIAGNOSIS — M6281 Muscle weakness (generalized): Secondary | ICD-10-CM

## 2023-11-16 DIAGNOSIS — R278 Other lack of coordination: Secondary | ICD-10-CM

## 2023-11-16 DIAGNOSIS — R102 Pelvic and perineal pain: Secondary | ICD-10-CM

## 2023-11-16 NOTE — Therapy (Signed)
 OUTPATIENT PHYSICAL THERAPY FEMALE PELVIC TREATMENT   Patient Name: Shelley Figueroa MRN: [id number] DOB:[birth year], 48 y.o., female Today's Date: 11/16/2023  END OF SESSION:  PT End of Session - 11/16/23 1448     Visit Number 7    Date for PT Re-Evaluation 02/12/24    Authorization Type [employer] 10% coinsurance    PT Start Time 1445    PT Stop Time 1515    PT Time Calculation (min) 30 min    Activity Tolerance Patient tolerated treatment well    Behavior During Therapy [facility] for tasks assessed/performed           Past Medical History:  Diagnosis Date   Allergic rhinitis due to pollen    Anemia    with pregnancy   Anxiety    Arthritis    Asthma    Chronic cystitis    COVID-19 virus infection 05/12/2021   Depression    Dyspareunia in female    Dyspnea on exertion    per pt hard to recover after exercise (cardiologist has echo and cardiac CT ordered when schedule is opened up)   Elevated LFTs 08/16/2022   resolved   Essential hypertension    cardioloigst-  dr t. Theodis Fiscal-- FH premature CAD--- 08-18-2014 normal stress echo   Foot pain 02/15/2022   History of chronic bronchitis    History of HPV infection    remote   History of kidney stones    History of recurrent UTIs    OA (osteoarthritis)    b. hands and b. feet   Pure hypercholesterolemia 09/20/2018   Renal calculus, left    per pt non-obstructive   Past Surgical History:  Procedure Laterality Date   CESAREAN SECTION  2004, 2005   CYSTOSCOPY N/A 11/06/2018   Procedure: CYSTOSCOPY;  Surgeon: Lillian Rein, MD;  Location: [facility];  Service: Gynecology;  Laterality: N/A;   HYSTEROSCOPY WITH D & C N/A 04/27/2022   Procedure: DILATATION AND CURETTAGE /HYSTEROSCOPY/MYOSURE POLYP RESECTION;  Surgeon: Lillian Rein, MD;  Location: [facility];  Service: Gynecology;  Laterality: N/A;   INTRAUTERINE DEVICE (IUD) INSERTION N/A 11/06/2018   Procedure: INTRAUTERINE DEVICE (IUD) INSERTION  WITH ULTRASOUND GUIDANCE;  Surgeon: Lillian Rein, MD;  Location:  [facility] [place];  Service: Gynecology;  Laterality: N/A;   INTRAUTERINE DEVICE (IUD) INSERTION N/A 04/27/2022   Procedure: INTRAUTERINE DEVICE (IUD) INSERTION;  Surgeon: Lillian Rein, MD;  Location: [facility] [place];  Service: Gynecology;  Laterality: N/A;   IUD REMOVAL N/A 11/06/2018   Procedure: INTRAUTERINE DEVICE (IUD) REMOVAL;  Surgeon: Lillian Rein, MD;  Location: [facility];  Service: Gynecology;  Laterality: N/A;  Mirena  IUD removal and reinsertion with ultrasound guidance.   IUD REMOVAL N/A 04/27/2022   Procedure: INTRAUTERINE DEVICE (IUD) REMOVAL;  Surgeon: Lillian Rein, MD;  Location: [facility];  Service: Gynecology;  Laterality: N/A;   KNEE ARTHROSCOPY Right 1995   with left knee realignment   KNEE SURGERY Left 1991   NASAL SINUS SURGERY  2015   wtih septoplasty   ORIF WRIST FRACTURE Right 07/18/2023   Procedure: OPEN REDUCTION INTERNAL FIXATION (ORIF) WRIST FRACTURE;  Surgeon: Saundra Curl, MD;  Location: [facility];  Service: Orthopedics;  Laterality: Right;   SEPTOPLASTY     TONSILLECTOMY  1987   Patient Active Problem List   Diagnosis Date Noted   Closed fracture of right distal radius 07/18/2023   Elevated LFTs 08/16/2022  Encounter for removal of intrauterine contraceptive device (IUD) 04/27/2022   Foot pain 02/15/2022   Joint pain in both hands 01/29/2021   Family history of coronary artery disease in father 07/30/2019   Contrast media allergy 07/30/2019   Insomnia 06/15/2018   Hyperlipidemia 04/06/2018   Encounter for annual general medical examination with abnormal findings in adult 12/28/2016   Asthma, chronic 05/28/2014   Essential hypertension 05/28/2014   ADJ DISORDER WITH MIXED ANXIETY & DEPRESSED MOOD 07/15/2009   Allergic rhinitis 07/06/2009    PCP: Gabriel John, NP  REFERRING PROVIDER: Lillian Rein, MD   REFERRING  DIAG: N94.10 (ICD-10-CM) - Dyspareunia in female  THERAPY DIAG:  Muscle weakness (generalized)  Other lack of coordination  Pelvic pain  Other muscle spasm  Rationale for Evaluation and Treatment: Rehabilitation  ONSET DATE: entire life  SUBJECTIVE:                                                                                                                                                                                           SUBJECTIVE STATEMENT: Pt states that she is very overwhelmed at work and at home right now. She feels like the vaginal moisturizers are still helpful.   PAIN:  Are you having pain? Yes NPRS scale: 10/10 Pain location: Vaginal  Pain type: burning, sharp, and throbbing Pain description: intermittent   Aggravating factors: intercourse, vaginal exams, tampon Relieving factors: no vaginal penetration or intimate activity  PRECAUTIONS: None  RED FLAGS: None   WEIGHT BEARING RESTRICTIONS: No  FALLS:  Has patient fallen in last 6 months? No  OCCUPATION: vice president of sales, works for home  ACTIVITY LEVEL : daily walks (1.5 miles), Systems analyst 3x/week 45 minutes   PLOF: Independent  PATIENT GOALS: to have less painful intercourse with partner   PERTINENT HISTORY:  2 c-sections, chronic cystitis, anxiety, asthma, anxiety, multiple IUD placements/removals all very painful  BOWEL MOVEMENT: Pain with bowel movement: No Type of bowel movement:Frequency 1x/day and Strain lately more with pain medications, but the last 8 years she has been much better Fully empty rectum: Yes:   Leakage: No Pads: No Fiber supplement/laxative Yes   URINATION: Pain with urination: No Fully empty bladder: Yes:   Stream: Strong Urgency: Yes  Frequency: every couple of hours Leakage: Laughing - occasional  Pads: No  INTERCOURSE:  Ability to have vaginal penetration Yes  Pain with intercourse: Initial Penetration, During Penetration, Deep  Penetration, and After Intercourse DrynessYes - feels like she will get wet and aroused 1x/month [place]ax: able to have orgasm, but only with external stimulation Marinoff Scale: 2/3 Lubricant: using a lot  of lubricant  PREGNANCY: Vaginal deliveries 0 Tearing No Episiotomy No C-section deliveries 2 Currently pregnant No  PROLAPSE: None   OBJECTIVE:  Note: Objective measures were completed at Evaluation unless otherwise noted. 09/04/23 Abdominal: significant tightness in Rt lower quadrant up to umbilical level                External Perineal Exam: WNL                             Internal Pelvic Floor: tenderness and tightness throughout; more tightness present on Lt compared to Rt; different sensations of uncomfortable pressure and burning - with prolonged pressure burning would start  Patient confirms identification and approves PT to assess internal pelvic floor and treatment Yes  PELVIC WGN:[id number]   MMT eval  Vaginal 4/5, 10 second hold, 12 repeats  Diastasis Recti 1.5 finger separation with distortion   (Blank rows = not tested)        TONE: high  PROLAPSE: Mild anterior/posterior vaginal wall laxity   08/28/23:   PATIENT SURVEYS:   PFIQ-7: 43  COGNITION: Overall cognitive status: Within functional limits for tasks assessed     SENSATION: Light touch: Appears intact  FUNCTIONAL TESTS: Squat: WNL Single leg stance:  Rt: pelvic drop  Lt: pelvic drop Curl-up test: deferred   GAIT: Assistive device utilized: None Comments: WNL  POSTURE: rounded shoulders, forward head, decreased lumbar lordosis, increased thoracic kyphosis, and posterior pelvic tilt   LUMBARAROM/PROM: WNL   PALPATION:   General: WNL  Pelvic Alignment: deferred    TODAY'S TREATMENT:                                                                                                                              DATE:  11/16/23 Therapeutic activities: Fixing physical impairments  and allowing for pain free penetration vs desire Revisiting benefit of sex therapist Pt gives verbal consent and requests PT to reach out to sex therapist with referral/contact information   11/09/23 Manual: Soft tissue mobilization to thoracic/lumbar paraspinals  PA mobilizations to lower thoracic spine grade 1-2 Exercises: Seated thoracic extensions 2 x 10 Seated open books 10x bil  10/19/23 Neuromuscular re-education: Diaphragmatic breathing Supine with 10lb wt: 3 seconds x 4, 4 seconds x 4, 5 seconds x 4 Supine green band around lower rib cage: 3 seconds x 4, 4 seconds x 4, 5 seconds x 4 Side lying lower rib cage pressure and quick stretch to help improve lateral rib cage expansion Exercises: Seated lateral side bending 10 breaths bil Sitting P stretch with additional rotation and overhead reach 10 breaths bil Therapeutic activities: External/internal vaginal moisturizers - samples given Dilator review Discussed progress and how mindfulness has been helping sleep and giving her more stress relief, reviewing efficacy of various HEP items   PATIENT EDUCATION:  Education details: See above Person educated: Patient Education method: Explanation, Demonstration, Tactile cues, Verbal cues,  and Handouts Education comprehension: verbalized understanding  HOME EXERCISE PROGRAM: [phone]  ASSESSMENT:  CLINICAL IMPRESSION: Pt has singificant life stress going on currently and does not feel able to work on HEP much at this time. She is wondering what her goals for being in pelvic floor physical therapy are. We had a discussion about how are goals are not to fix her desire surrounding intimacy, but to help improve discomfort surrounding any vaginal penetration, including gynecological exams. We also discussed that now may not be the best time to work on PT since it is only adding to her stress. She would like to take a little break and resume sessions in September when she can work on [employer]  on more regular basis and take advantage of treatment without adding to stress. She also wants to try sex therapy and requested for her referral/info be sent to [facility] with Brianna Tommes. She will continue to benefit from skilled PT intervention in order to improve pain with intercourse, improve discomfort in vulvar vaginal tissues, and improve quality of life.   OBJECTIVE IMPAIRMENTS: decreased activity tolerance, decreased coordination, decreased endurance, decreased mobility, decreased strength, increased muscle spasms, impaired tone, postural dysfunction, and pain.   ACTIVITY LIMITATIONS: vaginal penetration  PARTICIPATION LIMITATIONS: interpersonal relationship and gynecological exams   PERSONAL FACTORS: 3+ comorbidities: medical history are also affecting patient's functional outcome.   REHAB POTENTIAL: Fair chronic dyspareunia and asexual preference  CLINICAL DECISION MAKING: Evolving/moderate complexity  EVALUATION COMPLEXITY: Moderate   GOALS: Goals reviewed with patient? Yes  SHORT TERM GOALS: Target date: 09/25/2023 - updated 10/19/23   Pt will be independent with HEP.   Baseline: Goal status: MET 10/19/23  2.  Pt will report 25% improvement in pain with vaginal penetration.  Baseline:  Goal status: IN PROGRESS 10/19/23  3.  Pt will begin using dilators at least 3x/week in order to start vaginal desensitization program on her own.  Baseline:  Goal status: IN PROGRESS 10/19/23  4.  Pt will perform dialy vaginal moisturization.  Baseline:  Goal status: IN PROGRESS 10/19/23  5.  Pt will be independent with diaphragmatic breathing and down training activities in order to improve pelvic floor relaxation.  Baseline:  Goal status: IN PROGRESS 10/19/23   LONG TERM GOALS: Target date: 02/12/24 - updated 10/19/23  Pt will be independent with advanced HEP.   Baseline:  Goal status: IN PROGRESS 10/19/23  2.  Pt will report 50% improvement in pain with vaginal  penetration. Baseline:  Goal status: IN PROGRESS 10/19/23  3.  Pt will be independent with dilator activities and progression to larger sizes.  Baseline:  Goal status: IN PROGRESS 10/19/23  4.  Pt will demonstrate normal pelvic floor muscle A/ROM and tissue quality.  Baseline:  Goal status: IN PROGRESS 10/19/23  5.  Pt will be independent with vagal toning activities for improved down training.  Baseline:  Goal status: IN PROGRESS 10/19/23   PLAN:  PT FREQUENCY: 1-2x/week  PT DURATION: 6 months  PLANNED INTERVENTIONS: 97110-Therapeutic exercises, 97530- Therapeutic activity, 97112- Neuromuscular re-education, 97535- Self Care, [id number]- Manual therapy, Dry Needling, and Biofeedback  PLAN FOR NEXT SESSION: Going to take a break from PT for 2 months to allow for life to calm down and resume in September   Denman Pichardo, PT, DPT06/19/253:37 PM

## 2023-11-17 ENCOUNTER — Telehealth: Payer: Self-pay | Admitting: Cardiovascular Disease

## 2023-11-17 DIAGNOSIS — E785 Hyperlipidemia, unspecified: Secondary | ICD-10-CM

## 2023-11-17 DIAGNOSIS — I10 Essential (primary) hypertension: Secondary | ICD-10-CM

## 2023-11-17 DIAGNOSIS — R7989 Other specified abnormal findings of blood chemistry: Secondary | ICD-10-CM

## 2023-11-17 NOTE — Telephone Encounter (Signed)
 Pt called in to sch f/u with Dr. Theodis Fiscal. The notes states she needs cardiac score prior to appt, can this be ordered please. She would like to wait until Oct calendar to see Dr. Theodis Fiscal.

## 2023-11-22 NOTE — Telephone Encounter (Signed)
 FYI now that October schedule is open

## 2023-12-06 ENCOUNTER — Ambulatory Visit (HOSPITAL_COMMUNITY)
Admission: RE | Admit: 2023-12-06 | Discharge: 2023-12-06 | Disposition: A | Discharge status: Home / Self Care | Payer: Self-pay | Source: Ambulatory Visit | Attending: Cardiovascular Disease | Admitting: Cardiovascular Disease

## 2023-12-06 DIAGNOSIS — I10 Essential (primary) hypertension: Secondary | ICD-10-CM | POA: Insufficient documentation

## 2023-12-06 DIAGNOSIS — E785 Hyperlipidemia, unspecified: Secondary | ICD-10-CM | POA: Insufficient documentation

## 2023-12-06 DIAGNOSIS — R7989 Other specified abnormal findings of blood chemistry: Secondary | ICD-10-CM | POA: Insufficient documentation

## 2023-12-17 ENCOUNTER — Ambulatory Visit: Payer: Self-pay | Admitting: Cardiovascular Disease

## 2023-12-17 DIAGNOSIS — I10 Essential (primary) hypertension: Secondary | ICD-10-CM

## 2023-12-17 DIAGNOSIS — E785 Hyperlipidemia, unspecified: Secondary | ICD-10-CM

## 2023-12-17 DIAGNOSIS — R7989 Other specified abnormal findings of blood chemistry: Secondary | ICD-10-CM

## 2023-12-20 ENCOUNTER — Ambulatory Visit (HOSPITAL_BASED_OUTPATIENT_CLINIC_OR_DEPARTMENT_OTHER): Admitting: Cardiovascular Disease

## 2024-01-22 ENCOUNTER — Other Ambulatory Visit (HOSPITAL_BASED_OUTPATIENT_CLINIC_OR_DEPARTMENT_OTHER): Payer: Self-pay | Admitting: Cardiovascular Disease

## 2024-01-30 ENCOUNTER — Encounter (HOSPITAL_BASED_OUTPATIENT_CLINIC_OR_DEPARTMENT_OTHER): Payer: Self-pay | Admitting: Obstetrics & Gynecology

## 2024-01-30 ENCOUNTER — Ambulatory Visit: Payer: Self-pay | Attending: Obstetrics & Gynecology

## 2024-01-30 DIAGNOSIS — R102 Pelvic and perineal pain: Secondary | ICD-10-CM | POA: Insufficient documentation

## 2024-01-30 DIAGNOSIS — M6281 Muscle weakness (generalized): Secondary | ICD-10-CM | POA: Diagnosis present

## 2024-01-30 DIAGNOSIS — R278 Other lack of coordination: Secondary | ICD-10-CM | POA: Diagnosis present

## 2024-01-30 DIAGNOSIS — M62838 Other muscle spasm: Secondary | ICD-10-CM | POA: Diagnosis present

## 2024-01-30 NOTE — Therapy (Signed)
 OUTPATIENT PHYSICAL THERAPY FEMALE PELVIC TREATMENT   Patient Name: Shelley Figueroa MRN: [id number] DOB:September 11, 1975, 48 y.o., female Today's Date: 01/30/2024  END OF SESSION:  PT End of Session - 01/30/24 1531     Visit Number 9    Date for PT Re-Evaluation 02/12/24    Authorization Type [employer] 10% coinsurance    PT Start Time 1530    PT Stop Time 1612    PT Time Calculation (min) 42 min    Activity Tolerance Patient tolerated treatment well    Behavior During Therapy [facility] for tasks assessed/performed           Past Medical History:  Diagnosis Date   Allergic rhinitis due to pollen    Anemia    with pregnancy   Anxiety    Arthritis    Asthma    Chronic cystitis    COVID-19 virus infection 05/12/2021   Depression    Dyspareunia in female    Dyspnea on exertion    per pt hard to recover after exercise (cardiologist has echo and cardiac CT ordered when schedule is opened up)   Elevated LFTs 08/16/2022   resolved   Essential hypertension    cardioloigst-  dr t. raford-- FH premature CAD--- 08-18-2014 normal stress echo   Foot pain 02/15/2022   History of chronic bronchitis    History of HPV infection    remote   History of kidney stones    History of recurrent UTIs    OA (osteoarthritis)    b. hands and b. feet   Pure hypercholesterolemia 09/20/2018   Renal calculus, left    per pt non-obstructive   Past Surgical History:  Procedure Laterality Date   CESAREAN SECTION  2004, 2005   CYSTOSCOPY N/A 11/06/2018   Procedure: CYSTOSCOPY;  Surgeon: Cleotilde Ronal RAMAN, MD;  Location: [facility];  Service: Gynecology;  Laterality: N/A;   HYSTEROSCOPY WITH D & C N/A 04/27/2022   Procedure: DILATATION AND CURETTAGE /HYSTEROSCOPY/MYOSURE POLYP RESECTION;  Surgeon: Cleotilde Ronal RAMAN, MD;  Location: [facility];  Service: Gynecology;  Laterality: N/A;   INTRAUTERINE DEVICE (IUD) INSERTION N/A 11/06/2018   Procedure: INTRAUTERINE DEVICE (IUD) INSERTION  WITH ULTRASOUND GUIDANCE;  Surgeon: Cleotilde Ronal RAMAN, MD;  Location: [facility];  Service: Gynecology;  Laterality: N/A;   INTRAUTERINE DEVICE (IUD) INSERTION N/A 04/27/2022   Procedure: INTRAUTERINE DEVICE (IUD) INSERTION;  Surgeon: Cleotilde Ronal RAMAN, MD;  Location: [facility];  Service: Gynecology;  Laterality: N/A;   IUD REMOVAL N/A 11/06/2018   Procedure: INTRAUTERINE DEVICE (IUD) REMOVAL;  Surgeon: Cleotilde Ronal RAMAN, MD;  Location[facility];  Service: Gynecology;  Laterality: N/A;  Mirena  IUD removal and reinsertion with ultrasound guidance.   IUD REMOVAL N/A 04/27/2022   Procedure: INTRAUTERINE DEVICE (IUD) REMOVAL;  Surgeon: Cleotilde Ronal RAMAN, MD;  Location: [facility];  Service: Gynecology;  Laterality: N/A;   KNEE ARTHROSCOPY Right 1995   with left knee realignment   KNEE SURGERY Left 1991   NASAL SINUS SURGERY  2015   wtih septoplasty   ORIF WRIST FRACTURE Right 07/18/2023   Procedure: OPEN REDUCTION INTERNAL FIXATION (ORIF) WRIST FRACTURE;  Surgeon: Beverley Evalene BIRCH, MD;  Location: [facility];  Service: Orthopedics;  Laterality: Right;   SEPTOPLASTY     TONSILLECTOMY  1987   Patient Active Problem List   Diagnosis Date Noted   Closed fracture of right distal radius 07/18/2023   Elevated LFTs 08/16/2022  Encounter for removal of intrauterine contraceptive device (IUD) 04/27/2022   Foot pain 02/15/2022   Joint pain in both hands 01/29/2021   Family history of coronary artery disease in father 07/30/2019   Contrast media allergy 07/30/2019   Insomnia 06/15/2018   Hyperlipidemia 04/06/2018   Encounter for annual general medical examination with abnormal findings in adult 12/28/2016   Asthma, chronic 05/28/2014   Essential hypertension 05/28/2014   ADJ DISORDER WITH MIXED ANXIETY & DEPRESSED MOOD 07/15/2009   Allergic rhinitis 07/06/2009    PCP: Gretta Comer POUR, NP  REFERRING PROVIDER: Cleotilde Ronal RAMAN, MD   REFERRING  DIAG: N94.10 (ICD-10-CM) - Dyspareunia in female  THERAPY DIAG:  Muscle weakness (generalized)  Other lack of coordination  Pelvic pain  Other muscle spasm  Rationale for Evaluation and Treatment: Rehabilitation  ONSET DATE: entire life  SUBJECTIVE:                                                                                                                                                                                           SUBJECTIVE STATEMENT: Pt states that she is having intercourse 3-4x/week, about every other day. She feels like things are better. She is working on being more responsive and aware of her own body and if she has any desire she does try to have an orgasm. She has lost 10lbs and is strength training 2-3x/week.   PAIN: 01/30/24 Are you having pain? Yes NPRS scale: 6-7/10 Pain location: Vaginal  Pain type: burning, sharp, and throbbing Pain description: intermittent   Aggravating factors: intercourse, vaginal exams, tampon Relieving factors: no vaginal penetration or intimate activity  PRECAUTIONS: None  RED FLAGS: None   WEIGHT BEARING RESTRICTIONS: No  FALLS:  Has patient fallen in last 6 months? No  OCCUPATION: vice president of sales, works for home  ACTIVITY LEVEL : daily walks (1.5 miles), Systems analyst 3x/week 45 minutes   PLOF: Independent  PATIENT GOALS: to have less painful intercourse with partner   PERTINENT HISTORY:  2 c-sections, chronic cystitis, anxiety, asthma, anxiety, multiple IUD placements/removals all very painful  BOWEL MOVEMENT: Pain with bowel movement: No Type of bowel movement:Frequency 1x/day and Strain lately more with pain medications, but the last 8 years she has been much better Fully empty rectum: Yes:   Leakage: No Pads: No Fiber supplement/laxative Yes   URINATION: Pain with urination: No Fully empty bladder: Yes:   Stream: Strong Urgency: Yes  Frequency: every couple of hours Leakage:  Laughing - occasional  Pads: No  INTERCOURSE:  Ability to have vaginal penetration Yes  Pain with intercourse: Initial Penetration, During Penetration, Deep Penetration, and  After Intercourse DrynessYes - feels like she will get wet and aroused 1x/month Climax: able to have orgasm, but only with external stimulation Marinoff Scale: 2/3 Lubricant: using a lot of lubricant  PREGNANCY: Vaginal deliveries 0 Tearing No Episiotomy No C-section deliveries 2 Currently pregnant No  PROLAPSE: None   OBJECTIVE:  Note: Objective measures were completed at Evaluation unless otherwise noted. 09/04/23 Abdominal: significant tightness in Rt lower quadrant up to umbilical level                External Perineal Exam: WNL                             Internal Pelvic Floor: tenderness and tightness throughout; more tightness present on Lt compared to Rt; different sensations of uncomfortable pressure and burning - with prolonged pressure burning would start  Patient confirms identification and approves PT to assess internal pelvic floor and treatment Yes  PELVIC FFU:[id number]   MMT eval  Vaginal 4/5, 10 second hold, 12 repeats  Diastasis Recti 1.5 finger separation with distortion   (Blank rows = not tested)        TONE: high  PROLAPSE: Mild anterior/posterior vaginal wall laxity   08/28/23:   PATIENT SURVEYS:   PFIQ-7: 43  COGNITION: Overall cognitive status: Within functional limits for tasks assessed     SENSATION: Light touch: Appears intact  FUNCTIONAL TESTS: Squat: WNL Single leg stance:  Rt: pelvic drop  Lt: pelvic drop Curl-up test: deferred   GAIT: Assistive device utilized: None Comments: WNL  POSTURE: rounded shoulders, forward head, decreased lumbar lordosis, increased thoracic kyphosis, and posterior pelvic tilt   LUMBARAROM/PROM: WNL   PALPATION:   General: WNL  Pelvic Alignment: deferred    TODAY'S TREATMENT:                                                                                                                               DATE:  01/30/24 Therapeutic activities: We will try to connect with MD again about hormone levels Revisiting dilator discussion Regular use of vaginal moisturizing, especially while she is out of estrogen Discussion of vulvodynia based upon symptoms and us  understanding full scope of condition now including daily vulvovaginal pain Pelvic floor muscle wand education - believe it will be better than dilators at this time   11/16/23 Therapeutic activities: Fixing physical impairments and allowing for pain free penetration vs desire Revisiting benefit of sex therapist Pt gives verbal consent and requests PT to reach out to sex therapist with referral/contact information   11/09/23 Manual: Soft tissue mobilization to thoracic/lumbar paraspinals  PA mobilizations to lower thoracic spine grade 1-2 Exercises: Seated thoracic extensions 2 x 10 Seated open books 10x bil   PATIENT EDUCATION:  Education details: See above Person educated: Patient Education method: Explanation, Demonstration, Tactile cues, Verbal cues, and Handouts Education comprehension: verbalized understanding  HOME EXERCISE PROGRAM: [id number]  ASSESSMENT:  CLINICAL IMPRESSION: Pt is doing mildly better with intercourse. She feels like a lot of this is managing her expectations with intercourse, being able to stop when it is too painful, and recognizing when she does have more arousal instead of ignoring it. 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 This and given the fact that she has historically had a lot of levator ani spasm, believe the pelvic floor muscle wand may be a better option than regular dilator use. This also will fit her lifestyle more appropriately at  this time. We went over lubricants to try and suggested silicone based to help decrease dryness sensation during intercourse. She will continue to benefit from skilled PT intervention in order to improve pain with intercourse, improve discomfort in vulvar vaginal tissues, and improve quality of life.   OBJECTIVE IMPAIRMENTS: decreased activity tolerance, decreased coordination, decreased endurance, decreased mobility, decreased strength, increased muscle spasms, impaired tone, postural dysfunction, and pain.   ACTIVITY LIMITATIONS: vaginal penetration  PARTICIPATION LIMITATIONS: interpersonal relationship and gynecological exams   PERSONAL FACTORS: 3+ comorbidities: medical history are also affecting patient's functional outcome.   REHAB POTENTIAL: Fair chronic dyspareunia and asexual preference  CLINICAL DECISION MAKING: Evolving/moderate complexity  EVALUATION COMPLEXITY: Moderate   GOALS: Goals reviewed with patient? Yes  SHORT TERM GOALS: Updated 01/30/24   Pt will be independent with HEP.   Baseline: Goal status: MET 10/19/23  2.  Pt will report 25% improvement in pain with vaginal penetration.  Baseline:  Goal status: MET 01/30/24  3.  Pt will begin using dilators at least 3x/week in order to start vaginal desensitization program on her own.  Baseline:  Goal status: IN PROGRESS 01/30/24  4.  Pt will perform dialy vaginal moisturization.  Baseline: not performing on regular basis Goal status: IN PROGRESS 01/30/24  5.  Pt will be independent with diaphragmatic breathing and down training activities in order to improve pelvic floor relaxation.  Baseline:  Goal status: MET 01/30/24   LONG TERM GOALS: Updated 01/30/24  Pt will be independent with advanced HEP.   Baseline:  Goal status: IN PROGRESS 01/30/24  2.  Pt will report 50% improvement in pain with vaginal penetration. Baseline:  Goal status: IN PROGRESS 01/30/24  3.  Pt will be independent with dilator activities and  progression to larger sizes.  Baseline:  Goal status: IN PROGRESS 01/30/24  4.  Pt will demonstrate normal pelvic floor muscle A/ROM and tissue quality.  Baseline:  Goal status: IN PROGRESS 01/30/24  5.  Pt will be independent with vagal toning activities for improved down training.  Baseline:  Goal status: IN PROGRESS 01/30/24   PLAN:  PT FREQUENCY: 1-2x/week  PT DURATION: 6 months  PLANNED INTERVENTIONS: 97110-Therapeutic exercises, 97530- Therapeutic activity, 97112- Neuromuscular re-education, 97535- Self Care, [id number]- Manual therapy, Dry Needling, and Biofeedback  PLAN FOR NEXT SESSION: Going to take a break from PT for 2 months to allow for life to calm down and resume in September; RE-EVAL   Josette Mares, PT, DPT09/02/254:09 PM

## 2024-02-02 ENCOUNTER — Other Ambulatory Visit (HOSPITAL_BASED_OUTPATIENT_CLINIC_OR_DEPARTMENT_OTHER): Payer: Self-pay | Admitting: Obstetrics & Gynecology

## 2024-02-02 MED ORDER — NONFORMULARY OR COMPOUNDED ITEM: Status: DC

## 2024-02-12 ENCOUNTER — Ambulatory Visit: Payer: Self-pay

## 2024-02-12 DIAGNOSIS — R102 Pelvic and perineal pain: Secondary | ICD-10-CM

## 2024-02-12 DIAGNOSIS — R278 Other lack of coordination: Secondary | ICD-10-CM

## 2024-02-12 DIAGNOSIS — M6281 Muscle weakness (generalized): Secondary | ICD-10-CM

## 2024-02-12 DIAGNOSIS — M62838 Other muscle spasm: Secondary | ICD-10-CM

## 2024-02-12 NOTE — Therapy (Signed)
 OUTPATIENT PHYSICAL THERAPY FEMALE PELVIC TREATMENT   Patient Name: Shelley Figueroa MRN: [id number] DOB:1976/03/25, 48 y.o., female Today's Date: 02/12/2024  END OF SESSION:  PT End of Session - 02/12/24 1618     Visit Number 10    Date for PT Re-Evaluation 02/12/24    Authorization Type [employer] 10% coinsurance    PT Start Time 1615    PT Stop Time 1657    PT Time Calculation (min) 42 min    Activity Tolerance Patient tolerated treatment well    Behavior During Therapy [facility] for tasks assessed/performed           Past Medical History:  Diagnosis Date   Allergic rhinitis due to pollen    Anemia    with pregnancy   Anxiety    Arthritis    Asthma    Chronic cystitis    COVID-19 virus infection 05/12/2021   Depression    Dyspareunia in female    Dyspnea on exertion    per pt hard to recover after exercise (cardiologist has echo and cardiac CT ordered when schedule is opened up)   Elevated LFTs 08/16/2022   resolved   Essential hypertension    cardioloigst-  dr t. raford-- FH premature CAD--- 08-18-2014 normal stress echo   Foot pain 02/15/2022   History of chronic bronchitis    History of HPV infection    remote   History of kidney stones    History of recurrent UTIs    OA (osteoarthritis)    b. hands and b. feet   Pure hypercholesterolemia 09/20/2018   Renal calculus, left    per pt non-obstructive   Past Surgical History:  Procedure Laterality Date   CESAREAN SECTION  2004, 2005   CYSTOSCOPY N/A 11/06/2018   Procedure: CYSTOSCOPY;  Surgeon: Cleotilde Ronal RAMAN, MD;  Location: [facility] [place];  Service: Gynecology;  Laterality: N/A;   HYSTEROSCOPY WITH D & C N/A 04/27/2022   Procedure: DILATATION AND CURETTAGE /HYSTEROSCOPY/MYOSURE POLYP RESECTION;  Surgeon: Cleotilde Ronal RAMAN, MD;  Location: [facility];  Service: Gynecology;  Laterality: N/A;   INTRAUTERINE DEVICE (IUD) INSERTION N/A 11/06/2018   Procedure: INTRAUTERINE DEVICE (IUD)  INSERTION WITH ULTRASOUND GUIDANCE;  Surgeon: Cleotilde Ronal RAMAN, MD;  Location: [facility];  Service: Gynecology;  Laterality: N/A;   INTRAUTERINE DEVICE (IUD) INSERTION N/A 04/27/2022   Procedure: INTRAUTERINE DEVICE (IUD) INSERTION;  Surgeon: Cleotilde Ronal RAMAN, MD;  Location: [facility] [place];  Service: Gynecology;  Laterality: N/A;   IUD REMOVAL N/A 11/06/2018   Procedure: INTRAUTERINE DEVICE (IUD) REMOVAL;  Surgeon: Cleotilde Ronal RAMAN, MD;  Location: [facility];  Service: Gynecology;  Laterality: N/A;  Mirena  IUD removal and reinsertion with ultrasound guidance.   IUD REMOVAL N/A 04/27/2022   Procedure: INTRAUTERINE DEVICE (IUD) REMOVAL;  Surgeon: Cleotilde Ronal RAMAN, MD;  Location: [facility];  Service: Gynecology;  Laterality: N/A;   KNEE ARTHROSCOPY Right 1995   with left knee realignment   KNEE SURGERY Left 1991   NASAL SINUS SURGERY  2015   wtih septoplasty   ORIF WRIST FRACTURE Right 07/18/2023   Procedure: OPEN REDUCTION INTERNAL FIXATION (ORIF) WRIST FRACTURE;  Surgeon: Beverley Evalene BIRCH, MD;  Location: [facility];  Service: Orthopedics;  Laterality: Right;   SEPTOPLASTY     TONSILLECTOMY  1987   Patient Active Problem List   Diagnosis Date Noted   Closed fracture of right distal radius 07/18/2023   Elevated LFTs 08/16/2022  Encounter for removal of intrauterine contraceptive device (IUD) 04/27/2022   Foot pain 02/15/2022   Joint pain in both hands 01/29/2021   Family history of coronary artery disease in father 07/30/2019   Contrast media allergy 07/30/2019   Insomnia 06/15/2018   Hyperlipidemia 04/06/2018   Encounter for annual general medical examination with abnormal findings in adult 12/28/2016   Asthma, chronic 05/28/2014   Essential hypertension 05/28/2014   ADJ DISORDER WITH MIXED ANXIETY & DEPRESSED MOOD 07/15/2009   Allergic rhinitis 07/06/2009    PCP: Gretta Comer POUR, NP  REFERRING PROVIDER: Cleotilde Ronal RAMAN,  MD   REFERRING DIAG: N94.10 (ICD-10-CM) - Dyspareunia in female  THERAPY DIAG:  Muscle weakness (generalized)  Other lack of coordination  Pelvic pain  Other muscle spasm  Rationale for Evaluation and Treatment: Rehabilitation  ONSET DATE: entire life  SUBJECTIVE:                                                                                                                                                                                           SUBJECTIVE STATEMENT: Pt states that she has started using estrogen/testosterone cream for a week now. She has had increase in burning, itching, and pain. She feels like she has tried a lot of new things all at once and is not sure what is increasing her pain. She also states that she may have a lot of irritation from shaving  PAIN: 01/30/24 Are you having pain? Yes NPRS scale: 6-7/10 Pain location: Vaginal  Pain type: burning, sharp, and throbbing Pain description: intermittent   Aggravating factors: intercourse, vaginal exams, tampon Relieving factors: no vaginal penetration or intimate activity  PRECAUTIONS: None  RED FLAGS: None   WEIGHT BEARING RESTRICTIONS: No  FALLS:  Has patient fallen in last 6 months? No  OCCUPATION: vice president of sales, works for home  ACTIVITY LEVEL : daily walks (1.5 miles), Systems analyst 3x/week 45 minutes   PLOF: Independent  PATIENT GOALS: to have less painful intercourse with partner   PERTINENT HISTORY:  2 c-sections, chronic cystitis, anxiety, asthma, anxiety, multiple IUD placements/removals all very painful  BOWEL MOVEMENT: Pain with bowel movement: No Type of bowel movement:Frequency 1x/day and Strain lately more with pain medications, but the last 8 years she has been much better Fully empty rectum: Yes:   Leakage: No Pads: No Fiber supplement/laxative Yes   URINATION: Pain with urination: No Fully empty bladder: Yes:   Stream: Strong Urgency: Yes  Frequency:  every couple of hours Leakage: Laughing - occasional  Pads: No  INTERCOURSE:  Ability to have vaginal penetration Yes  Pain with intercourse: Initial Penetration, During  Penetration, Deep Penetration, and After Intercourse DrynessYes - feels like she will get wet and aroused 1x/month Cl[place]ax: able to have orgasm, but only with external stimulation Marinoff Scale: 2/3 Lubricant: using a lot of lubricant  PREGNANCY: Vaginal deliveries 0 Tearing No Episiotomy No C-section deliveries 2 Currently pregnant No  PROLAPSE: None   OBJECTIVE:  Note: Objective measures were completed at Evaluation unless otherwise noted. 09/04/23 Abdominal: significant tightness in Rt lower quadrant up to umbilical level                External Perineal Exam: WNL                             Internal Pelvic Floor: tenderness and tightness throughout; more tightness present on Lt compared to Rt; different sensations of uncomfortable pressure and burning - with prolonged pressure burning would start  Patient confirms identification and approves PT to assess internal pelvic floor and treatment Yes  PELVIC FFU:[id number]   MMT eval  Vaginal 4/5, 10 second hold, 12 repeats  Diastasis Recti 1.5 finger separation with distortion   (Blank rows = not tested)        TONE: high  PROLAPSE: Mild anterior/posterior vaginal wall laxity   08/28/23:   PATIENT SURVEYS:   PFIQ-7: 43  COGNITION: Overall cognitive status: Within functional limits for tasks assessed     SENSATION: Light touch: Appears intact  FUNCTIONAL TESTS: Squat: WNL Single leg stance:  Rt: pelvic drop  Lt: pelvic drop Curl-up test: deferred   GAIT: Assistive device utilized: None Comments: WNL  POSTURE: rounded shoulders, forward head, decreased lumbar lordosis, increased thoracic kyphosis, and posterior pelvic tilt   LUMBARAROM/PROM: WNL   PALPATION:   General: WNL  Pelvic Alignment: deferred    TODAY'S TREATMENT:                                                                                                                               DATE:  02/12/24 Manual: Pt provides verbal consent for internal vaginal/rectal pelvic floor exam. Bil superficial and deep pelvic floor muscle release to tolerance in supine Therapeutic activities: Vulvodynia aggravating factors: increased sitting, tight clothing, changing detergents/soaps, changing products  Length of time to see benefit/change in hormone creams typically Pt education on lidocaine  creams for improved vaginal comfort   01/30/24 Therapeutic activities: We will try to connect with MD again about hormone levels Revisiting dilator discussion Regular use of vaginal moisturizing, especially while she is out of estrogen Discussion of vulvodynia based upon symptoms and us  understanding full scope of condition now including daily vulvovaginal pain Pelvic floor muscle wand education - believe it will be better than dilators at this time   11/16/23 Therapeutic activities: Fixing physical impairments and allowing for pain free penetration vs desire Revisiting benefit of sex therapist Pt gives verbal consent and requests PT to reach out to sex therapist with referral/contact information  PATIENT EDUCATION:  Education details: See above Person educated: Patient Education method: Explanation, Demonstration, Tactile cues, Verbal cues, and Handouts Education comprehension: verbalized understanding  HOME EXERCISE PROGRAM: [id number]  ASSESSMENT:  CLINICAL IMPRESSION: Pt has had very painful week and is not sure what the cause is as she has introduced several different products and spent a lot of time traveling/sitting. 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 We also performed internal pelvic floor muscle release to help her determine  appropirate way to use pelvic floor muscle wand; she demonstrated good understanding and we mapped pain on each side of pelvic floor to help guide her. She will continue to benefit from skilled PT intervention in order to improve pain with intercourse, improve discomfort in vulvar vaginal tissues, and improve quality of life.   OBJECTIVE IMPAIRMENTS: decreased activity tolerance, decreased coordination, decreased endurance, decreased mobility, decreased strength, increased muscle spasms, impaired tone, postural dysfunction, and pain.   ACTIVITY LIMITATIONS: vaginal penetration  PARTICIPATION LIMITATIONS: interpersonal relationship and gynecological exams   PERSONAL FACTORS: 3+ comorbidities: medical history are also affecting patient's functional outcome.   REHAB POTENTIAL: Fair chronic dyspareunia and asexual preference  CLINICAL DECISION MAKING: Evolving/moderate complexity  EVALUATION COMPLEXITY: Moderate   GOALS: Goals reviewed with patient? Yes  SHORT TERM GOALS: Updated 01/30/24   Pt will be independent with HEP.   Baseline: Goal status: MET 10/19/23  2.  Pt will report 25% improvement in pain with vaginal penetration.  Baseline:  Goal status: MET 01/30/24  3.  Pt will begin using dilators at least 3x/week in order to start vaginal desensitization program on her own.  Baseline:  Goal status: IN PROGRESS 01/30/24  4.  Pt will perform dialy vaginal moisturization.  Baseline: not performing on regular basis Goal status: IN PROGRESS 01/30/24  5.  Pt will be independent with diaphragmatic breathing and down training activities in order to improve pelvic floor relaxation.  Baseline:  Goal status: MET 01/30/24   LONG TERM GOALS: Updated 01/30/24  Pt will be independent with advanced HEP.   Baseline:  Goal status: IN PROGRESS 01/30/24  2.  Pt will report 50% improvement in pain with vaginal penetration. Baseline:  Goal status: IN PROGRESS 01/30/24  3.  Pt will be independent  with dilator activities and progression to larger sizes.  Baseline:  Goal status: IN PROGRESS 01/30/24  4.  Pt will demonstrate normal pelvic floor muscle A/ROM and tissue quality.  Baseline:  Goal status: IN PROGRESS 01/30/24  5.  Pt will be independent with vagal toning activities for improved down training.  Baseline:  Goal status: IN PROGRESS 01/30/24   PLAN:  PT FREQUENCY: 1-2x/week  PT DURATION: 6 months  PLANNED INTERVENTIONS: 97110-Therapeutic exercises, 97530- Therapeutic activity, 97112- Neuromuscular re-education, 97535- Self Care, [id number]- Manual therapy, Dry Needling, and Biofeedback  PLAN FOR NEXT SESSION: Going to take a break from PT for 2 months to allow for life to calm down and resume in September; RE-EVAL   Josette Mares, PT, DPT09/15/255:05 PM

## 2024-03-12 LAB — COMPREHENSIVE METABOLIC PANEL WITH GFR
ALT: 27 IU/L (ref 0–32)
AST: 21 IU/L (ref 0–40)
Albumin: 4.4 g/dL (ref 3.9–4.9)
Alkaline Phosphatase: 74 IU/L (ref 41–116)
BUN/Creatinine Ratio: 21 (ref 9–23)
BUN: 21 mg/dL (ref 6–24)
Bilirubin Total: 0.5 mg/dL (ref 0.0–1.2)
CO2: 25 mmol/L (ref 20–29)
Calcium: 9.3 mg/dL (ref 8.7–10.2)
Chloride: 101 mmol/L (ref 96–106)
Creatinine, Ser: 1.01 mg/dL — ABNORMAL HIGH (ref 0.57–1.00)
Globulin, Total: 1.9 g/dL (ref 1.5–4.5)
Glucose: 101 mg/dL — ABNORMAL HIGH (ref 70–99)
Potassium: 3.5 mmol/L (ref 3.5–5.2)
Sodium: 141 mmol/L (ref 134–144)
Total Protein: 6.3 g/dL (ref 6.0–8.5)
eGFR: 68 mL/min/1.73 (ref >59)

## 2024-03-12 LAB — LIPID PANEL
Chol/HDL Ratio: 5 ratio — ABNORMAL HIGH (ref 0.0–4.4)
Cholesterol, Total: 248 mg/dL — ABNORMAL HIGH (ref 100–199)
HDL: 50 mg/dL (ref >39)
LDL Chol Calc (NIH): 180 mg/dL — ABNORMAL HIGH (ref 0–99)
Triglycerides: 101 mg/dL (ref 0–149)
VLDL Cholesterol Cal: 18 mg/dL (ref 5–40)

## 2024-03-18 ENCOUNTER — Encounter (HOSPITAL_BASED_OUTPATIENT_CLINIC_OR_DEPARTMENT_OTHER): Payer: Self-pay

## 2024-03-18 ENCOUNTER — Ambulatory Visit: Payer: Self-pay | Attending: Obstetrics & Gynecology

## 2024-03-18 ENCOUNTER — Ambulatory Visit (HOSPITAL_BASED_OUTPATIENT_CLINIC_OR_DEPARTMENT_OTHER): Admitting: Cardiovascular Disease

## 2024-03-18 DIAGNOSIS — R102 Pelvic and perineal pain unspecified side: Secondary | ICD-10-CM | POA: Insufficient documentation

## 2024-03-18 DIAGNOSIS — N941 Unspecified dyspareunia: Secondary | ICD-10-CM | POA: Diagnosis present

## 2024-03-18 DIAGNOSIS — M62838 Other muscle spasm: Secondary | ICD-10-CM | POA: Diagnosis present

## 2024-03-18 DIAGNOSIS — R278 Other lack of coordination: Secondary | ICD-10-CM | POA: Insufficient documentation

## 2024-03-18 DIAGNOSIS — M6281 Muscle weakness (generalized): Secondary | ICD-10-CM | POA: Diagnosis present

## 2024-03-18 NOTE — Therapy (Signed)
 OUTPATIENT PHYSICAL THERAPY FEMALE PELVIC TREATMENT   Patient Name: Shelley Figueroa MRN: [id number] DOB:Oct 14, 1975, 48 y.o., female Today's Date: 03/18/2024  END OF SESSION:  PT End of Session - 03/18/24 1617     Visit Number 11    Date for Recertification  09/02/24    Authorization Type [employer] 10% coinsurance    PT Start Time 1615    PT Stop Time 1655    PT Time Calculation (min) 40 min    Activity Tolerance Patient tolerated treatment well    Behavior During Therapy [facility] for tasks assessed/performed           Past Medical History:  Diagnosis Date   Allergic rhinitis due to pollen    Anemia    with pregnancy   Anxiety    Arthritis    Asthma    Chronic cystitis    COVID-19 virus infection 05/12/2021   Depression    Dyspareunia in female    Dyspnea on exertion    per pt hard to recover after exercise (cardiologist has echo and cardiac CT ordered when schedule is opened up)   Elevated LFTs 08/16/2022   resolved   Essential hypertension    cardioloigst-  dr t. raford-- FH premature CAD--- 08-18-2014 normal stress echo   Foot pain 02/15/2022   History of chronic bronchitis    History of HPV infection    remote   History of kidney stones    History of recurrent UTIs    OA (osteoarthritis)    b. hands and b. feet   Pure hypercholesterolemia 09/20/2018   Renal calculus, left    per pt non-obstructive   Past Surgical History:  Procedure Laterality Date   CESAREAN SECTION  2004, 2005   CYSTOSCOPY N/A 11/06/2018   Procedure: CYSTOSCOPY;  Surgeon: Cleotilde Ronal RAMAN, MD;  Location: [facility];  Service: Gynecology;  Laterality: N/A;   HYSTEROSCOPY WITH D & C N/A 04/27/2022   Procedure: DILATATION AND CURETTAGE /HYSTEROSCOPY/MYOSURE POLYP RESECTION;  Surgeon: Cleotilde Ronal RAMAN, MD;  Location: [facility];  Service: Gynecology;  Laterality: N/A;   INTRAUTERINE DEVICE (IUD) INSERTION N/A 11/06/2018   Procedure: INTRAUTERINE DEVICE (IUD)  INSERTION WITH ULTRASOUND GUIDANCE;  Surgeon: Cleotilde Ronal RAMAN, MD;  Location: [facility];  Service: Gynecology;  Laterality: N/A;   INTRAUTERINE DEVICE (IUD) INSERTION N/A 04/27/2022   Procedure: INTRAUTERINE DEVICE (IUD) INSERTION;  Surgeon: Cleotilde Ronal RAMAN, MD;  Location: [facility];  Service: Gynecology;  Laterality: N/A;   IUD REMOVAL N/A 11/06/2018   Procedure: INTRAUTERINE DEVICE (IUD) REMOVAL;  Surgeon: Cleotilde Ronal RAMAN, MD;  Location: [facility];  Service: Gynecology;  Laterality: N/A;  Mirena  IUD removal and reinsertion with ultrasound guidance.   IUD REMOVAL N/A 04/27/2022   Procedure: INTRAUTERINE DEVICE (IUD) REMOVAL;  Surgeon: Cleotilde Ronal RAMAN, MD;  Location: [facility];  Service: Gynecology;  Laterality: N/A;   KNEE ARTHROSCOPY Right 1995   with left knee realignment   KNEE SURGERY Left 1991   NASAL SINUS SURGERY  2015   wtih septoplasty   ORIF WRIST FRACTURE Right 07/18/2023   Procedure: OPEN REDUCTION INTERNAL FIXATION (ORIF) WRIST FRACTURE;  Surgeon: Beverley Evalene BIRCH, MD;  Location: [facility];  Service: Orthopedics;  Laterality: Right;   SEPTOPLASTY     TONSILLECTOMY  1987   Patient Active Problem List   Diagnosis Date Noted   Closed fracture of right distal radius 07/18/2023   Elevated LFTs 08/16/2022  Encounter for removal of intrauterine contraceptive device (IUD) 04/27/2022   Foot pain 02/15/2022   Joint pain in both hands 01/29/2021   Family history of coronary artery disease in father 07/30/2019   Contrast media allergy 07/30/2019   Insomnia 06/15/2018   Hyperlipidemia 04/06/2018   Encounter for annual general medical examination with abnormal findings in adult 12/28/2016   Asthma, chronic 05/28/2014   Essential hypertension 05/28/2014   ADJ DISORDER WITH MIXED ANXIETY & DEPRESSED MOOD 07/15/2009   Allergic rhinitis 07/06/2009    PCP: Gretta Comer POUR, NP  REFERRING PROVIDER: Cleotilde Ronal RAMAN,  MD   REFERRING DIAG: N94.10 (ICD-10-CM) - Dyspareunia in female  THERAPY DIAG:  Muscle weakness (generalized)  Other lack of coordination  Pelvic pain  Other muscle spasm  Dyspareunia in female  Rationale for Evaluation and Treatment: Rehabilitation  ONSET DATE: entire life  SUBJECTIVE:                                                                                                                                                                                           SUBJECTIVE STATEMENT: Pt states that she has continued estrogen and testosterone cream. She is not sure if she is noticing any change. She is not having any increase in irritation. She did order vulva wash from [facility] and states that it is very helpful.   PAIN: 03/18/24 Are you having pain? Yes NPRS scale: 6-7/10 Pain location: Vaginal  Pain type: burning, sharp, and throbbing Pain description: intermittent   Aggravating factors: intercourse, vaginal exams, tampon Relieving factors: no vaginal penetration or intimate activity  PRECAUTIONS: None  RED FLAGS: None   WEIGHT BEARING RESTRICTIONS: No  FALLS:  Has patient fallen in last 6 months? No  OCCUPATION: vice president of sales, works for home  ACTIVITY LEVEL : daily walks (1.5 miles), Systems analyst 3x/week 45 minutes   PLOF: Independent  PATIENT GOALS: to have less painful intercourse with partner   PERTINENT HISTORY:  2 c-sections, chronic cystitis, anxiety, asthma, anxiety, multiple IUD placements/removals all very painful  BOWEL MOVEMENT: Pain with bowel movement: No Type of bowel movement:Frequency 1x/day and Strain lately more with pain medications, but the last 8 years she has been much better Fully empty rectum: Yes:   Leakage: No Pads: No Fiber supplement/laxative Yes   URINATION: Pain with urination: No Fully empty bladder: Yes:   Stream: Strong Urgency: Yes  Frequency: every couple of hours Leakage: Laughing -  occasional  Pads: No  INTERCOURSE:  Ability to have vaginal penetration Yes  Pain with intercourse: Initial Penetration, During Penetration, Deep Penetration, and After Intercourse DrynessYes - feels like  she will get wet and aroused 1x/month Climax: able to have orgasm, but only with external stimulation Marinoff Scale: 2/3 Lubricant: using a lot of lubricant  PREGNANCY: Vaginal deliveries 0 Tearing No Episiotomy No C-section deliveries 2 Currently pregnant No  PROLAPSE: None   OBJECTIVE:  Note: Objective measures were completed at Evaluation unless otherwise noted. 03/18/24 Difficulty with pelvic floor muscle relaxation - decreased ability to perform pelvic floor muscle relaxation, but better after contraction  09/04/23 Abdominal: significant tightness in Rt lower quadrant up to umbilical level                External Perineal Exam: WNL                             Internal Pelvic Floor: tenderness and tightness throughout; more tightness present on Lt compared to Rt; different sensations of uncomfortable pressure and burning - with prolonged pressure burning would start  Patient confirms identification and approves PT to assess internal pelvic floor and treatment Yes  PELVIC MMT:   MMT eval  Vaginal 4/5, 10 second hold, 12 repeats  Diastasis Recti 1.5 finger separation with distortion   (Blank rows = not tested)        TONE: high  PROLAPSE: Mild anterior/posterior vaginal wall laxity   08/28/23:   PATIENT SURVEYS:   PFIQ-7: 43  COGNITION: Overall cognitive status: Within functional limits for tasks assessed     SENSATION: Light touch: Appears intact  FUNCTIONAL TESTS: Squat: WNL Single leg stance:  Rt: pelvic drop  Lt: pelvic drop Curl-up test: deferred   GAIT: Assistive device utilized: None Comments: WNL  POSTURE: rounded shoulders, forward head, decreased lumbar lordosis, increased thoracic kyphosis, and posterior pelvic  tilt   LUMBARAROM/PROM: WNL   PALPATION:   General: WNL  Pelvic Alignment: deferred    TODAY'S TREATMENT:                                                                                                                              DATE:  03/18/24 RE-EVAL Neuromuscular re-education: Pelvic floor muscle contraction training: Quick flicks - perform with exhale - focusing on relaxation and movement vs strong contraction Therapeutic activities: Pt education performed on stress impact on libido Review of vagal toning activities  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 Bil superficial and deep pelvic floor muscle release to tolerance in supine Therapeutic activities: Vulvodynia aggravating factors: increased sitting, tight clothing, changing detergents/soaps, changing products  Length of time to see benefit/change in hormone creams typically Pt education on lidocaine  creams for improved vaginal comfort   01/30/24 Therapeutic activities: We will try to connect with MD again about hormone levels Revisiting dilator discussion Regular use of vaginal moisturizing, especially while she is out of estrogen Discussion of vulvodynia based upon symptoms and  us  understanding full scope of condition now including daily vulvovaginal pain Pelvic floor muscle wand education - believe it will be better than dilators at this time    PATIENT EDUCATION:  Education details: See above Person educated: Patient Education method: Explanation, Demonstration, Tactile cues, Verbal cues, and Handouts Education comprehension: verbalized understanding  HOME EXERCISE PROGRAM: [id number]  ASSESSMENT:  CLINICAL IMPRESSION: Pt doing better currently than she was a month ago. 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 She  will have episodes of intercourse that are not horribly painful and sometimes enjoyable (1-2x/month). She has started using pelvic floor muscle wand and we have altered how she is using in order to be slightly less intense. Once she has more decrease in deeper pelvic floor muscle spasm, she will benefit form return to dilator. Hopefully we can return to dilator use with less vulvar irritation at this future time. She feels like she has seen 15% improvement in pain with intercourse, but she feels like her comfort level on a day to day basis is much better. She will continue to benefit from skilled PT intervention in order to improve pain with intercourse, improve discomfort in vulvar vaginal tissues, and improve quality of life.   OBJECTIVE IMPAIRMENTS: decreased activity tolerance, decreased coordination, decreased endurance, decreased mobility, decreased strength, increased muscle spasms, impaired tone, postural dysfunction, and pain.   ACTIVITY LIMITATIONS: vaginal penetration  PARTICIPATION LIMITATIONS: interpersonal relationship and gynecological exams   PERSONAL FACTORS: 3+ comorbidities: medical history are also affecting patient's functional outcome.   REHAB POTENTIAL: Fair chronic dyspareunia and asexual preference  CLINICAL DECISION MAKING: Evolving/moderate complexity  EVALUATION COMPLEXITY: Moderate   GOALS: Goals reviewed with patient? Yes  SHORT TERM GOALS: Updated 03/18/24   Pt will be independent with HEP.   Baseline: Goal status: MET 10/19/23  2.  Pt will report 25% improvement in pain with vaginal penetration.  Baseline: 15% better Goal status: IN PROGRESS 01/30/24  3.  Pt will begin using dilators at least 3x/week in order to start vaginal desensitization program on her own.  Baseline: she is not using dilators right now - we are using pelvic floor wand instead currently Goal status: IN PROGRESS 03/18/24  4.  Pt will perform dialy vaginal moisturization.  Baseline:  performing on regular basis Goal status: MET 03/18/24  5.  Pt will be independent with diaphragmatic breathing and down training activities in order to improve pelvic floor relaxation.  Baseline:  Goal status: MET 01/30/24   LONG TERM GOALS: Updated 03/18/24  Pt will be independent with advanced HEP.   Baseline:  Goal status: IN PROGRESS 03/18/24  2.  Pt will report 50% improvement in pain with vaginal penetration. Baseline: she feels 15% better Goal status: IN PROGRESS 03/18/24  3.  Pt will be independent with dilator activities and progression to larger sizes.  Baseline:  Goal status: IN PROGRESS 03/18/24  4.  Pt will demonstrate normal pelvic floor muscle A/ROM and tissue quality.  Baseline: still demonstrating increased tone in pelvic floor Goal status: IN PROGRESS 03/18/24  5.  Pt will be independent with vagal toning activities for improved down training.  Baseline:  Goal status: MET 03/18/24   PLAN:  PT FREQUENCY: 1-2x/week  PT DURATION: 6 months  PLANNED INTERVENTIONS: 97110-Therapeutic exercises, 97530- Therapeutic activity, 97112- Neuromuscular re-education, 97535- Self Care, [id number]- Manual therapy, Dry Needling, and Biofeedback  PLAN FOR NEXT SESSION: continue to perform pelvic floor muscle desensitization   Josette Mares, PT,  DPT10/20/257:09 PM

## 2024-03-21 ENCOUNTER — Ambulatory Visit (HOSPITAL_BASED_OUTPATIENT_CLINIC_OR_DEPARTMENT_OTHER): Admitting: Cardiovascular Disease

## 2024-03-21 ENCOUNTER — Encounter (HOSPITAL_BASED_OUTPATIENT_CLINIC_OR_DEPARTMENT_OTHER): Payer: Self-pay | Admitting: Cardiovascular Disease

## 2024-03-21 VITALS — BP 100/70 | HR 81 | Ht 66.0 in | Wt 147.7 lb

## 2024-03-21 DIAGNOSIS — E782 Mixed hyperlipidemia: Secondary | ICD-10-CM | POA: Diagnosis not present

## 2024-03-21 DIAGNOSIS — I10 Essential (primary) hypertension: Secondary | ICD-10-CM

## 2024-03-21 DIAGNOSIS — Z8249 Family history of ischemic heart disease and other diseases of the circulatory system: Secondary | ICD-10-CM | POA: Diagnosis not present

## 2024-03-21 DIAGNOSIS — R739 Hyperglycemia, unspecified: Secondary | ICD-10-CM

## 2024-03-21 DIAGNOSIS — Z5181 Encounter for therapeutic drug level monitoring: Secondary | ICD-10-CM

## 2024-03-21 MED ORDER — ROSUVASTATIN CALCIUM 10 MG PO TABS: 10.0000 mg | ORAL_TABLET | Freq: Every day | ORAL | 3 refills | Status: AC

## 2024-03-21 NOTE — Patient Instructions (Addendum)
 Medication Instructions:  START ROSUVASTATIN 10 MG DAILY   *If you need a refill on your cardiac medications before your next appointment, please call your pharmacy*  Lab Work: LPa/THS/A1C TODAY   FASTING LP/CMET IN 3 MONTHS   If you have labs (blood work) drawn today and your tests are completely normal, you will receive your results only by: MyChart Message (if you have MyChart) OR A paper copy in the mail If you have any lab test that is abnormal or we need to change your treatment, we will call you to review the results.  Testing/Procedures: NONE  Follow-Up: At [facility], you and your health needs are our priority.  As part of our continuing mission to provide you with exceptional heart care, our providers are all part of one team.  This team includes your primary Cardiologist (physician) and Advanced Practice Providers or APPs (Physician Assistants and Nurse Practitioners) who all work together to provide you with the care you need, when you need it.  Your next appointment:   6 month(s)  Provider:   Annabella Scarce, MD, Rosaline Bane, NP, or Reche Finder, NP    We recommend signing up for the patient portal called MyChart.  Sign up information is provided on this After Visit Summary.  MyChart is used to connect with patients for Virtual Visits (Telemedicine).  Patients are able to view lab/test results, encounter notes, upcoming appointments, etc.  Non-urgent messages can be sent to your provider as well.   To learn more about what you can do with MyChart, go to [email or web address].   Other Instructions YOU WILL GET RESULTS VIA EMAIL

## 2024-03-21 NOTE — Progress Notes (Signed)
 Cardiology Office Note:  .   Date:  04/02/2024  ID:  [name], Shelley Figueroa, Shelley Figueroa PCP: Gretta Comer POUR, NP  [facility] Providers Cardiologist:  Annabella Scarce, MD     History of Present Illness: .    Shelley Figueroa is a 48 y.o. female with hypertension and chronic bronchitis here for follow-up on hypertension and family history of heart disease.   Shelley Figueroa was initially diagnosed with hypertension around 2016. She initially tried lisinopril  but developed a cough.  It was well-controlled on hydrochlorothiazide .  However her blood pressure then started to get higher.  She does have a stressful job but this had not changed. Shelley Figueroa is also concerned about a family history of heart disease.  Both her parents smoked heavily.  Her father had a heart attack in his early 49s and multiple stents placed.  He later went on to have quadruple bypass surgery.  Shelley Figueroa had a stress echo in 2015 that was negative for ischemia.  Her symptoms were thought to be musculoskeletal.   Shelley Figueroa followed up with Shelley Figueroa and reported exertional dyspnea.  She had an echo 12/2018 that revealed normal systolic function and diastolic function.  It was otherwise unremarkable.  She also had a coronary CTA 12/2018 that revealed no coronary artery disease and normal anatomy.  She did have a contrast reaction that required steroids.     At her visit 01/2022 she was doing well with her blood pressures averaging in the 130s over 80s.  She was under a lot of stress from work and from her children in college and senior year of high school.  She had gained weight and felt that this was why her blood pressures were running higher.  She was switched from Maxide to valsartan /HCTZ.  She followed up with our pharmacist and was feeling better.  Blood pressures were much better controlled.  Calcium score 11/2023 revealed a score of 2 which was 85th percentile.  We recommend  starting a statin but she preferred to work on diet and exercise.   Discussed the use of AI scribe software for clinical note transcription with the patient, who gave verbal consent to proceed.  History of Present Illness Shelley Figueroa is a 48 year old female who presents for evaluation of elevated LDL cholesterol levels.  Despite significant lifestyle changes, including strength training three times a week and walking her puppy twice daily, her LDL cholesterol levels have continued to rise. She has been tracking her diet since July, focusing on protein intake and reducing sugar, resulting in an 11-pound weight loss. However, her LDL cholesterol remains elevated, while her triglycerides have decreased and HDL is in the good range. She has a family history of high cholesterol and heart disease, raising concern about familial hyperlipidemia. 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  She is entering perimenopause and has experienced symptoms such as brain fog and dizziness, particularly during exercise. She recalls a recent episode of elevated blood pressure associated with dehydration and high sodium intake. She reports that past elevated liver tests returned to normal after she lost five pounds, and her gastroenterologist found no other issues.  She is interested in understanding the implications of her cholesterol levels on potential hormone replacement therapy as she begins perimenopause. She has experienced her first hot flash and is considering options for managing symptoms.  ROS:  As per HPI  Studies Reviewed: SABRA       CAC 12/06/23: IMPRESSION: Coronary calcium score of 2. This was 85th percentile for age-, race-, and sex-matched controls.  Risk Assessment/Calculations:        Physical Exam:   VS:  BP 100/70 (BP Location: Left Arm, Patient Position: Sitting, Cuff Size: Normal)    Pulse 81   Ht 5' 6 (1.676 m)   Wt 147 lb 11.2 oz (67 kg)   SpO2 95%   BMI 23.84 kg/m  , BMI Body mass index is 23.84 kg/m. GENERAL:  Well appearing HEENT: Pupils equal round and reactive, fundi not visualized, oral mucosa unremarkable NECK:  No jugular venous distention, waveform within normal limits, carotid upstroke brisk and symmetric, no bruits, no thyromegaly LUNGS:  Clear to auscultation bilaterally HEART:  RRR.  PMI not displaced or sustained,S1 and S2 within normal limits, no S3, no S4, no clicks, no rubs, no murmurs ABD:  Flat, positive bowel sounds normal in frequency in pitch, no bruits, no rebound, no guarding, no midline pulsatile mass, no hepatomegaly, no splenomegaly EXT:  2 plus pulses throughout, no edema, no cyanosis no clubbing SKIN:  No rashes no nodules NEURO:  Cranial nerves II through XII grossly intact, motor grossly intact throughout PSYCH:  Cognitively intact, oriented to person place and time   ASSESSMENT AND PLAN: .    Assessment & Plan # Familial hyperlipidemia # CAD:  CAC was 2, which is 85th percentile for age/gender.  Elevated LDL cholesterol likely due to familial hyperlipidemia. Lifestyle changes insufficient to reach target LDL levels. - Start rosuvastatin 10 mg daily. - Order LP(a) test. - Order A1c test. - Repeat lipid panel, kidney, liver, and electrolyte tests in 2-3 months. - Consider genetic testing for familial hyperlipidemia.  # Primary hypertension Blood pressure well-controlled with current medication, occasional spikes due to dehydration and high sodium intake. - Monitor blood pressure at home. - Adjust medication if necessary based on home blood pressure readings.  # Dizziness Intermittent dizziness during exercise, possibly related to perimenopause. - Monitor blood pressure at home to assess for low readings.  # Perimenopause Early symptoms of perimenopause with brain fog and hot flashes. Discussed HRT risks, especially in  coronary artery disease, though she lacks obstructive coronary artery disease. - Consider topical vaginal estrogen for symptoms. - Consider black cohosh for symptom management.     Dispo: f/u in 6 months  Signed, Annabella Scarce, MD

## 2024-03-25 ENCOUNTER — Ambulatory Visit (HOSPITAL_BASED_OUTPATIENT_CLINIC_OR_DEPARTMENT_OTHER): Payer: Self-pay | Admitting: Family

## 2024-03-25 ENCOUNTER — Ambulatory Visit: Payer: Self-pay

## 2024-03-25 LAB — HEMOGLOBIN A1C
Est. average glucose Bld gHb Est-mCnc: 108 mg/dL
Hgb A1c MFr Bld: 5.4 % (ref 4.8–5.6)

## 2024-03-25 LAB — LIPOPROTEIN A (LPA): Lipoprotein (a): 123.2 nmol/L — ABNORMAL HIGH (ref <75.0)

## 2024-03-25 LAB — TSH: TSH: 2.4 u[IU]/mL (ref 0.450–4.500)

## 2024-04-02 ENCOUNTER — Encounter (HOSPITAL_BASED_OUTPATIENT_CLINIC_OR_DEPARTMENT_OTHER): Payer: Self-pay | Admitting: Cardiovascular Disease

## 2024-04-09 ENCOUNTER — Ambulatory Visit: Payer: Self-pay | Attending: Obstetrics & Gynecology

## 2024-04-09 DIAGNOSIS — R102 Pelvic and perineal pain unspecified side: Secondary | ICD-10-CM | POA: Insufficient documentation

## 2024-04-09 DIAGNOSIS — M6281 Muscle weakness (generalized): Secondary | ICD-10-CM | POA: Insufficient documentation

## 2024-04-09 DIAGNOSIS — R278 Other lack of coordination: Secondary | ICD-10-CM | POA: Insufficient documentation

## 2024-04-09 DIAGNOSIS — M62838 Other muscle spasm: Secondary | ICD-10-CM | POA: Insufficient documentation

## 2024-04-09 DIAGNOSIS — N941 Unspecified dyspareunia: Secondary | ICD-10-CM | POA: Insufficient documentation

## 2024-04-09 NOTE — Therapy (Signed)
 OUTPATIENT PHYSICAL THERAPY FEMALE PELVIC TREATMENT   Patient Name: Shelley Figueroa MRN: [id number] DOB:[birth year], 48 y.o., female Today's Date: 04/09/2024  END OF SESSION:  PT End of Session - 04/09/24 1110     Visit Number 12    Date for Recertification  09/02/24    Authorization Type [employer] 10% coinsurance    PT Start Time 1104    PT Stop Time 1142    PT Time Calculation (min) 38 min    Activity Tolerance Patient tolerated treatment well    Behavior During Therapy [facility] for tasks assessed/performed           Past Medical History:  Diagnosis Date   Allergic rhinitis due to pollen    Allergy 1989   Anemia    with pregnancy   Anxiety    Arthritis    Asthma    Chronic cystitis    COVID-19 virus infection 05/12/2021   Depression    Dyspareunia in female    Dyspnea on exertion    per pt hard to recover after exercise (cardiologist has echo and cardiac CT ordered when schedule is opened up)   Elevated LFTs 08/16/2022   resolved   Essential hypertension    cardioloigst-  dr t. raford-- FH premature CAD--- 08-18-2014 normal stress echo   Foot pain 02/15/2022   History of chronic bronchitis    History of HPV infection    remote   History of kidney stones    History of recurrent UTIs    OA (osteoarthritis)    b. hands and b. feet   Pure hypercholesterolemia 09/20/2018   Renal calculus, left    per pt non-obstructive   Past Surgical History:  Procedure Laterality Date   CESAREAN SECTION  2004, 2005   CYSTOSCOPY N/A 11/06/2018   Procedure: CYSTOSCOPY;  Surgeon: Cleotilde Ronal RAMAN, MD;  Location: [facility] [place];  Service: Gynecology;  Laterality: N/A;   HYSTEROSCOPY WITH D & C N/A 04/27/2022   Procedure: DILATATION AND CURETTAGE /HYSTEROSCOPY/MYOSURE POLYP RESECTION;  Surgeon: Cleotilde Ronal RAMAN, MD;  Location: [facility];  Service: Gynecology;  Laterality: N/A;   INTRAUTERINE DEVICE (IUD) INSERTION N/A 11/06/2018   Procedure: INTRAUTERINE  DEVICE (IUD) INSERTION WITH ULTRASOUND GUIDANCE;  Surgeon: Cleotilde Ronal RAMAN, MD;  Location: [facility];  Service: Gynecology;  Laterality: N/A;   INTRAUTERINE DEVICE (IUD) INSERTION N/A 04/27/2022   Procedure: INTRAUTERINE DEVICE (IUD) INSERTION;  Surgeon: Cleotilde Ronal RAMAN, MD;  Location: [facility] ;  Service: Gynecology;  Laterality: N/A;   IUD REMOVAL N/A 11/06/2018   Procedure: INTRAUTERINE DEVICE (IUD) REMOVAL;  Surgeon: Cleotilde Ronal RAMAN, MD;  Location: [facility];  Service: Gynecology;  Laterality: N/A;  Mirena  IUD removal and reinsertion with ultrasound guidance.   IUD REMOVAL N/A 04/27/2022   Procedure: INTRAUTERINE DEVICE (IUD) REMOVAL;  Surgeon: Cleotilde Ronal RAMAN, MD;  Location: [facility];  Service: Gynecology;  Laterality: N/A;   KNEE ARTHROSCOPY Right 1995   with left knee realignment   KNEE SURGERY Left 1991   NASAL SINUS SURGERY  2015   wtih septoplasty   ORIF WRIST FRACTURE Right 07/18/2023   Procedure: OPEN REDUCTION INTERNAL FIXATION (ORIF) WRIST FRACTURE;  Surgeon: Beverley Evalene BIRCH, MD;  Location: [facility];  Service: Orthopedics;  Laterality: Right;   SEPTOPLASTY     TONSILLECTOMY  1987   Patient Active Problem List   Diagnosis Date Noted   Closed fracture of right distal radius 07/18/2023  Elevated LFTs 08/16/2022   Encounter for removal of intrauterine contraceptive device (IUD) 04/27/2022   Foot pain 02/15/2022   Joint pain in both hands 01/29/2021   Family history of coronary artery disease in father 07/30/2019   Contrast media allergy 07/30/2019   Insomnia 06/15/2018   Hyperlipidemia 04/06/2018   Encounter for annual general medical examination with abnormal findings in adult 12/28/2016   Asthma, chronic 05/28/2014   Essential hypertension 05/28/2014   ADJ DISORDER WITH MIXED ANXIETY & DEPRESSED MOOD 07/15/2009   Allergic rhinitis 07/06/2009    PCP: Gretta Comer POUR, NP  REFERRING PROVIDER: Cleotilde Ronal RAMAN, MD   REFERRING DIAG: N94.10 (ICD-10-CM) - Dyspareunia in female  THERAPY DIAG:  Muscle weakness (generalized)  Other lack of coordination  Pelvic pain  Other muscle spasm  Dyspareunia in female  Rationale for Evaluation and Treatment: Rehabilitation  ONSET DATE: entire life  SUBJECTIVE:                                                                                                                                                                                           SUBJECTIVE STATEMENT: Pt states that she is feeling a little better. She is on her cycle, but will know if this have to do with improvements or not in the next week. She feels like the testosterone might be helping. She really likes the medicine mama vulva wash. She has having less irritation and more feeling. Pt states that she pulled her low back exercising and is having tightness.   PAIN: 04/09/24 Are you having pain? Yes NPRS scale: 3-4/10 Pain location: Vaginal; low back  Pain type: burning, sharp, and throbbing Pain description: intermittent   Aggravating factors: intercourse, vaginal exams, tampon Relieving factors: no vaginal penetration or intimate activity  PRECAUTIONS: None  RED FLAGS: None   WEIGHT BEARING RESTRICTIONS: No  FALLS:  Has patient fallen in last 6 months? No  OCCUPATION: vice president of sales, works for home  ACTIVITY LEVEL : daily walks (1.5 miles), systems analyst 3x/week 45 minutes   PLOF: Independent  PATIENT GOALS: to have less painful intercourse with partner   PERTINENT HISTORY:  2 c-sections, chronic cystitis, anxiety, asthma, anxiety, multiple IUD placements/removals all very painful  BOWEL MOVEMENT: Pain with bowel movement: No Type of bowel movement:Frequency 1x/day and Strain lately more with pain medications, but the last 8 years she has been much better Fully empty rectum: Yes:   Leakage: No Pads: No Fiber supplement/laxative Yes    URINATION: Pain with urination: No Fully empty bladder: Yes:   Stream: Strong Urgency: Yes  Frequency: every couple of hours Leakage: Laughing -  occasional  Pads: No  INTERCOURSE:  Ability to have vaginal penetration Yes  Pain with intercourse: Initial Penetration, During Penetration, Deep Penetration, and After Intercourse DrynessYes - feels like she will get wet and aroused 1x/month Climax: able to have orgasm, but only with external stimulation Marinoff Scale: 2/3 Lubricant: using a lot of lubricant  PREGNANCY: Vaginal deliveries 0 Tearing No Episiotomy No C-section deliveries 2 Currently pregnant No  PROLAPSE: None   OBJECTIVE:  Note: Objective measures were completed at Evaluation unless otherwise noted. 03/18/24 Difficulty with pelvic floor muscle relaxation - decreased ability to perform pelvic floor muscle relaxation, but better after contraction  09/04/23 Abdominal: significant tightness in Rt lower quadrant up to umbilical level                External Perineal Exam: WNL                             Internal Pelvic Floor: tenderness and tightness throughout; more tightness present on Lt compared to Rt; different sensations of uncomfortable pressure and burning - with prolonged pressure burning would start  Patient confirms identification and approves PT to assess internal pelvic floor and treatment Yes  PELVIC MMT:   MMT eval  Vaginal 4/5, 10 second hold, 12 repeats  Diastasis Recti 1.5 finger separation with distortion   (Blank rows = not tested)        TONE: high  PROLAPSE: Mild anterior/posterior vaginal wall laxity   08/28/23:   PATIENT SURVEYS:   PFIQ-7: 43  COGNITION: Overall cognitive status: Within functional limits for tasks assessed     SENSATION: Light touch: Appears intact  FUNCTIONAL TESTS: Squat: WNL Single leg stance:  Rt: pelvic drop  Lt: pelvic drop Curl-up test: deferred   GAIT: Assistive device utilized:  None Comments: WNL  POSTURE: rounded shoulders, forward head, decreased lumbar lordosis, increased thoracic kyphosis, and posterior pelvic tilt   LUMBARAROM/PROM: WNL   PALPATION:   General: WNL  Pelvic Alignment: deferred    TODAY'S TREATMENT:                                                                                                                              DATE:  04/09/24 Manual: Prone Negative pressure soft tissue mobilization to low back Trigger point release to lumbar paraspinals and bil glutes Soft tissue mobilization to low back Therapeutic activities: Review of HEP including mindfulness and active awareness of pelvic floor muscle relaxation throughout the day   03/18/24 RE-EVAL Neuromuscular re-education: Pelvic floor muscle contraction training: Quick flicks - perform with exhale - focusing on relaxation and movement vs strong contraction Therapeutic activities: Pt education performed on stress impact on libido Review of vagal toning activities  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 Bil superficial and deep pelvic floor muscle  release to tolerance in supine Therapeutic activities: Vulvodynia aggravating factors: increased sitting, tight clothing, changing detergents/soaps, changing products  Length of time to see benefit/change in hormone creams typically Pt education on lidocaine  creams for improved vaginal comfort   PATIENT EDUCATION:  Education details: See above Person educated: Patient Education method: Explanation, Demonstration, Tactile cues, Verbal cues, and Handouts Education comprehension: verbalized understanding  HOME EXERCISE PROGRAM: [id number]  ASSESSMENT:  CLINICAL IMPRESSION: Pt continues to see improvement in vulvar and vaginal comfort. She saw some improvements with intercourse in the  last several weeks. She is unsure if this is where she is in her cycle or if it's adding in the testosterone, but we should know in the next couple of weeks. We reviewed HEP, foucsing on relaxation techniques. We also performed myofascial release and soft tissue mobilization to low back to help reduce increased restriction from hurting herself working out yesterday. She will continue to benefit from skilled PT intervention in order to improve pain with intercourse, improve discomfort in vulvar vaginal tissues, and improve quality of life.   OBJECTIVE IMPAIRMENTS: decreased activity tolerance, decreased coordination, decreased endurance, decreased mobility, decreased strength, increased muscle spasms, impaired tone, postural dysfunction, and pain.   ACTIVITY LIMITATIONS: vaginal penetration  PARTICIPATION LIMITATIONS: interpersonal relationship and gynecological exams   PERSONAL FACTORS: 3+ comorbidities: medical history are also affecting patient's functional outcome.   REHAB POTENTIAL: Fair chronic dyspareunia and asexual preference  CLINICAL DECISION MAKING: Evolving/moderate complexity  EVALUATION COMPLEXITY: Moderate   GOALS: Goals reviewed with patient? Yes  SHORT TERM GOALS: Updated 03/18/24   Pt will be independent with HEP.   Baseline: Goal status: MET 10/19/23  2.  Pt will report 25% improvement in pain with vaginal penetration.  Baseline: 15% better Goal status: IN PROGRESS 01/30/24  3.  Pt will begin using dilators at least 3x/week in order to start vaginal desensitization program on her own.  Baseline: she is not using dilators right now - we are using pelvic floor wand instead currently Goal status: IN PROGRESS 03/18/24  4.  Pt will perform dialy vaginal moisturization.  Baseline: performing on regular basis Goal status: MET 03/18/24  5.  Pt will be independent with diaphragmatic breathing and down training activities in order to improve pelvic floor  relaxation.  Baseline:  Goal status: MET 01/30/24   LONG TERM GOALS: Updated 03/18/24  Pt will be independent with advanced HEP.   Baseline:  Goal status: IN PROGRESS 03/18/24  2.  Pt will report 50% improvement in pain with vaginal penetration. Baseline: she feels 15% better Goal status: IN PROGRESS 03/18/24  3.  Pt will be independent with dilator activities and progression to larger sizes.  Baseline:  Goal status: IN PROGRESS 03/18/24  4.  Pt will demonstrate normal pelvic floor muscle A/ROM and tissue quality.  Baseline: still demonstrating increased tone in pelvic floor Goal status: IN PROGRESS 03/18/24  5.  Pt will be independent with vagal toning activities for improved down training.  Baseline:  Goal status: MET 03/18/24   PLAN:  PT FREQUENCY: 1-2x/week  PT DURATION: 6 months  PLANNED INTERVENTIONS: 97110-Therapeutic exercises, 97530- Therapeutic activity, 97112- Neuromuscular re-education, 97535- Self Care, [id number]- Manual therapy, Dry Needling, and Biofeedback  PLAN FOR NEXT SESSION: continue to perform pelvic floor muscle desensitization   Josette Mares, PT, DPT11/03/2510:28 AM

## 2024-04-15 ENCOUNTER — Encounter: Payer: Self-pay | Admitting: Internal Medicine

## 2024-04-24 ENCOUNTER — Ambulatory Visit

## 2024-04-24 DIAGNOSIS — M6281 Muscle weakness (generalized): Secondary | ICD-10-CM

## 2024-04-24 DIAGNOSIS — R102 Pelvic and perineal pain unspecified side: Secondary | ICD-10-CM

## 2024-04-24 DIAGNOSIS — M62838 Other muscle spasm: Secondary | ICD-10-CM

## 2024-04-24 DIAGNOSIS — N941 Unspecified dyspareunia: Secondary | ICD-10-CM

## 2024-04-24 DIAGNOSIS — R278 Other lack of coordination: Secondary | ICD-10-CM

## 2024-04-24 NOTE — Therapy (Signed)
 OUTPATIENT PHYSICAL THERAPY FEMALE PELVIC TREATMENT   Patient Name: Shelley Figueroa MRN: [id number] DOB:1976/01/17, 48 y.o., female Today's Date: 04/24/2024  END OF SESSION:  PT End of Session - 04/24/24 0934     Visit Number 13    Date for Recertification  09/02/24    Authorization Type [employer] 10% coinsurance    PT Start Time 0932    PT Stop Time 1010    PT Time Calculation (min) 38 min    Activity Tolerance Patient tolerated treatment well    Behavior During Therapy [facility] for tasks assessed/performed           Past Medical History:  Diagnosis Date   Allergic rhinitis due to pollen    Allergy 1989   Anemia    with pregnancy   Anxiety    Arthritis    Asthma    Chronic cystitis    COVID-19 virus infection 05/12/2021   Depression    Dyspareunia in female    Dyspnea on exertion    per pt hard to recover after exercise (cardiologist has echo and cardiac CT ordered when schedule is opened up)   Elevated LFTs 08/16/2022   resolved   Essential hypertension    cardioloigst-  dr t. raford-- FH premature CAD--- 08-18-2014 normal stress echo   Foot pain 02/15/2022   History of chronic bronchitis    History of HPV infection    remote   History of kidney stones    History of recurrent UTIs    OA (osteoarthritis)    b. hands and b. feet   Pure hypercholesterolemia 09/20/2018   Renal calculus, left    per pt non-obstructive   Past Surgical History:  Procedure Laterality Date   CESAREAN SECTION  2004, 2005   CYSTOSCOPY N/A 11/06/2018   Procedure: CYSTOSCOPY;  Surgeon: Cleotilde Ronal RAMAN, MD;  Location: [facility];  Service: Gynecology;  Laterality: N/A;   HYSTEROSCOPY WITH D & C N/A 04/27/2022   Procedure: DILATATION AND CURETTAGE /HYSTEROSCOPY/MYOSURE POLYP RESECTION;  Surgeon: Cleotilde Ronal RAMAN, MD;  Location: [facility];  Service: Gynecology;  Laterality: N/A;   INTRAUTERINE DEVICE (IUD) INSERTION N/A 11/06/2018   Procedure: INTRAUTERINE  DEVICE (IUD) INSERTION WITH ULTRASOUND GUIDANCE;  Surgeon: Cleotilde Ronal RAMAN, MD;  Location: [facility];  Service: Gynecology;  Laterality: N/A;   INTRAUTERINE DEVICE (IUD) INSERTION N/A 04/27/2022   Procedure: INTRAUTERINE DEVICE (IUD) INSERTION;  Surgeon: Cleotilde Ronal RAMAN, MD;  Location: [facility] ;  Service: Gynecology;  Laterality: N/A;   IUD REMOVAL N/A 11/06/2018   Procedure: INTRAUTERINE DEVICE (IUD) REMOVAL;  Surgeon: Cleotilde Ronal RAMAN, MD;  Location: [facility];  Service: Gynecology;  Laterality: N/A;  Mirena  IUD removal and reinsertion with ultrasound guidance.   IUD REMOVAL N/A 04/27/2022   Procedure: INTRAUTERINE DEVICE (IUD) REMOVAL;  Surgeon: Cleotilde Ronal RAMAN, MD;  Location: [facility];  Service: Gynecology;  Laterality: N/A;   KNEE ARTHROSCOPY Right 1995   with left knee realignment   KNEE SURGERY Left 1991   NASAL SINUS SURGERY  2015   wtih septoplasty   ORIF WRIST FRACTURE Right 07/18/2023   Procedure: OPEN REDUCTION INTERNAL FIXATION (ORIF) WRIST FRACTURE;  Surgeon: Beverley Evalene BIRCH, MD;  Location: [facility];  Service: Orthopedics;  Laterality: Right;   SEPTOPLASTY     TONSILLECTOMY  1987   Patient Active Problem List   Diagnosis Date Noted   Closed fracture of right distal radius 07/18/2023  Elevated LFTs 08/16/2022   Encounter for removal of intrauterine contraceptive device (IUD) 04/27/2022   Foot pain 02/15/2022   Joint pain in both hands 01/29/2021   Family history of coronary artery disease in father 07/30/2019   Contrast media allergy 07/30/2019   Insomnia 06/15/2018   Hyperlipidemia 04/06/2018   Encounter for annual general medical examination with abnormal findings in adult 12/28/2016   Asthma, chronic 05/28/2014   Essential hypertension 05/28/2014   ADJ DISORDER WITH MIXED ANXIETY & DEPRESSED MOOD 07/15/2009   Allergic rhinitis 07/06/2009    PCP: Gretta Comer POUR, NP  REFERRING PROVIDER: Cleotilde Ronal RAMAN, MD   REFERRING DIAG: N94.10 (ICD-10-CM) - Dyspareunia in female  THERAPY DIAG:  Muscle weakness (generalized)  Other lack of coordination  Pelvic pain  Other muscle spasm  Dyspareunia in female  Rationale for Evaluation and Treatment: Rehabilitation  ONSET DATE: entire life  SUBJECTIVE:                                                                                                                                                                                           SUBJECTIVE STATEMENT: Pt continues to feel like the testosterone cream is helping. She is going to look into HRT.   PAIN: 04/09/24 Are you having pain? Yes NPRS scale: 3-4/10 Pain location: Vaginal; low back  Pain type: burning, sharp, and throbbing Pain description: intermittent   Aggravating factors: intercourse, vaginal exams, tampon Relieving factors: no vaginal penetration or intimate activity  PRECAUTIONS: None  RED FLAGS: None   WEIGHT BEARING RESTRICTIONS: No  FALLS:  Has patient fallen in last 6 months? No  OCCUPATION: vice president of sales, works for home  ACTIVITY LEVEL : daily walks (1.5 miles), systems analyst 3x/week 45 minutes   PLOF: Independent  PATIENT GOALS: to have less painful intercourse with partner   PERTINENT HISTORY:  2 c-sections, chronic cystitis, anxiety, asthma, anxiety, multiple IUD placements/removals all very painful  BOWEL MOVEMENT: Pain with bowel movement: No Type of bowel movement:Frequency 1x/day and Strain lately more with pain medications, but the last 8 years she has been much better Fully empty rectum: Yes:   Leakage: No Pads: No Fiber supplement/laxative Yes   URINATION: Pain with urination: No Fully empty bladder: Yes:   Stream: Strong Urgency: Yes  Frequency: every couple of hours Leakage: Laughing - occasional  Pads: No  INTERCOURSE:  Ability to have vaginal penetration Yes  Pain with intercourse: Initial Penetration, During  Penetration, Deep Penetration, and After Intercourse DrynessYes - feels like she will get wet and aroused 1x/month Climax: able to have orgasm, but only with external stimulation Marinoff Scale:  2/3 Lubricant: using a lot of lubricant  PREGNANCY: Vaginal deliveries 0 Tearing No Episiotomy No C-section deliveries 2 Currently pregnant No  PROLAPSE: None   OBJECTIVE:  Note: Objective measures were completed at Evaluation unless otherwise noted. 03/18/24 Difficulty with pelvic floor muscle relaxation - decreased ability to perform pelvic floor muscle relaxation, but better after contraction  09/04/23 Abdominal: significant tightness in Rt lower quadrant up to umbilical level                External Perineal Exam: WNL                             Internal Pelvic Floor: tenderness and tightness throughout; more tightness present on Lt compared to Rt; different sensations of uncomfortable pressure and burning - with prolonged pressure burning would start  Patient confirms identification and approves PT to assess internal pelvic floor and treatment Yes  PELVIC MMT:   MMT eval  Vaginal 4/5, 10 second hold, 12 repeats  Diastasis Recti 1.5 finger separation with distortion   (Blank rows = not tested)        TONE: high  PROLAPSE: Mild anterior/posterior vaginal wall laxity   08/28/23:   PATIENT SURVEYS:   PFIQ-7: 43  COGNITION: Overall cognitive status: Within functional limits for tasks assessed     SENSATION: Light touch: Appears intact  FUNCTIONAL TESTS: Squat: WNL Single leg stance:  Rt: pelvic drop  Lt: pelvic drop Curl-up test: deferred   GAIT: Assistive device utilized: None Comments: WNL  POSTURE: rounded shoulders, forward head, decreased lumbar lordosis, increased thoracic kyphosis, and posterior pelvic tilt   LUMBARAROM/PROM: WNL   PALPATION:   General: WNL  Pelvic Alignment: deferred    TODAY'S TREATMENT:                                                                                                                               DATE:  04/24/24 Manual:  Neuromuscular re-education:  Exercises:  Therapeutic activities: Pt education on review of all lifestyle changes HEP review Tracking cycles and when pain increases during intercourse   04/09/24 Manual: Prone Negative pressure soft tissue mobilization to low back Trigger point release to lumbar paraspinals and bil glutes Soft tissue mobilization to low back Therapeutic activities: Review of HEP including mindfulness and active awareness of pelvic floor muscle relaxation throughout the day   03/18/24 RE-EVAL Neuromuscular re-education: Pelvic floor muscle contraction training: Quick flicks - perform with exhale - focusing on relaxation and movement vs strong contraction Therapeutic activities: Pt education performed on stress impact on libido Review of vagal toning activities  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 She is doing well overall, but does continue to have painful flare ups. We reviewed HEP and lifestyle intervention today to make sure that all pieces are in place to help optimize function.We discussed tracking cycles and symptoms, including pain with intercourse, in order to have the most information as possible for pain cycles and when she is in menopause. She will continue to benefit from skilled PT intervention in order to improve pain with intercourse, improve discomfort in vulvar vaginal tissues, and improve quality of life.   OBJECTIVE IMPAIRMENTS: decreased  activity tolerance, decreased coordination, decreased endurance, decreased mobility, decreased strength, increased muscle spasms, impaired tone, postural dysfunction, and pain.   ACTIVITY LIMITATIONS: vaginal penetration  PARTICIPATION LIMITATIONS: interpersonal relationship and gynecological exams   PERSONAL FACTORS: 3+ comorbidities: medical history are also affecting patient's functional outcome.   REHAB POTENTIAL: Fair chronic dyspareunia and asexual preference  CLINICAL DECISION MAKING: Evolving/moderate complexity  EVALUATION COMPLEXITY: Moderate   GOALS: Goals reviewed with patient? Yes  SHORT TERM GOALS: Updated 03/18/24   Pt will be independent with HEP.   Baseline: Goal status: MET 10/19/23  2.  Pt will report 25% improvement in pain with vaginal penetration.  Baseline: 15% better Goal status: MET 04/24/24  3.  Pt will begin using dilators at least 3x/week in order to start vaginal desensitization program on her own.  Baseline: she is not using dilators right now - we are using pelvic floor wand instead currently Goal status: IN PROGRESS 04/24/24  4.  Pt will perform dialy vaginal moisturization.  Baseline: performing on regular basis Goal status: MET 03/18/24  5.  Pt will be independent with diaphragmatic breathing and down training activities in order to improve pelvic floor relaxation.  Baseline:  Goal status: MET 01/30/24   LONG TERM GOALS: Updated 03/18/24  Pt will be independent with advanced HEP.   Baseline:  Goal status: IN PROGRESS 04/24/24  2.  Pt will report 50% improvement in pain with vaginal penetration. Baseline: she feels 25% better Goal status: IN PROGRESS 04/24/24  3.  Pt will be independent with dilator activities and progression to larger sizes.  Baseline:  Goal status: IN PROGRESS 04/24/24  4.  Pt will demonstrate normal pelvic floor muscle A/ROM and tissue quality.  Baseline: still demonstrating increased tone in pelvic  floor Goal status: IN PROGRESS 04/24/24  5.  Pt will be independent with vagal toning activities for improved down training.  Baseline:  Goal status: MET 03/18/24   PLAN:  PT FREQUENCY: 1-2x/week  PT DURATION: 6 months  PLANNED INTERVENTIONS: 97110-Therapeutic exercises, 97530- Therapeutic activity, 97112- Neuromuscular re-education, 97535- Self Care, [id number]- Manual therapy, Dry Needling, and Biofeedback  PLAN FOR NEXT SESSION: continue to perform pelvic floor muscle desensitization   Josette Mares, PT, DPT11/26/259:35 AM

## 2024-05-06 ENCOUNTER — Ambulatory Visit

## 2024-05-07 ENCOUNTER — Encounter: Payer: Self-pay | Admitting: Physician Assistant

## 2024-05-07 ENCOUNTER — Ambulatory Visit: Admitting: Physician Assistant

## 2024-05-07 DIAGNOSIS — F411 Generalized anxiety disorder: Secondary | ICD-10-CM | POA: Diagnosis not present

## 2024-05-07 DIAGNOSIS — G4700 Insomnia, unspecified: Secondary | ICD-10-CM | POA: Diagnosis not present

## 2024-05-07 DIAGNOSIS — F3342 Major depressive disorder, recurrent, in full remission: Secondary | ICD-10-CM

## 2024-05-07 MED ORDER — ESZOPICLONE 3 MG PO TABS: 3.0000 mg | ORAL_TABLET | Freq: Every evening | ORAL | 1 refills | Status: AC | PRN

## 2024-05-07 MED ORDER — ALPRAZOLAM 0.25 MG PO TABS: 0.1250 mg | ORAL_TABLET | Freq: Two times a day (BID) | ORAL | 1 refills | Status: AC | PRN

## 2024-05-07 MED ORDER — BUPROPION HCL ER (XL) 300 MG PO TB24: 300.0000 mg | ORAL_TABLET | Freq: Every day | ORAL | 1 refills | Status: AC

## 2024-05-07 NOTE — Progress Notes (Signed)
 [facility] Med Check  Patient ID: Shelley Figueroa,  MRN: [id number]  PCP: Gretta Comer POUR, NP  Date of Evaluation: 05/07/2024 Time spent:20 minutes  Chief Complaint:  Chief Complaint   Anxiety; Insomnia; Depression; Follow-up    HISTORY/CURRENT STATUS: HPI For routine med check.   Walking daily, works out three times per week.  Eating healthy.  Work is going well, traveling more for her job.   She is able to enjoy things.  Energy and motivation are good.   No extreme sadness, tearfulness, or feelings of hopelessness.  Sleeps okay, getting about 7 hours most nights, that is with using Lunesta  and melatonin.  ADLs and personal hygiene are normal.   Denies any changes in concentration, making decisions, or remembering things.  Appetite has not changed.  Anxiety is controlled.  She takes Xanax  when needed and it is effective.  No mania, delirium, AH/VH.  No SI/HI.  She asks about HRT.  Has been reading more about it and plans to talk to her GYN at her annual visit in January.  She feels like it would be very beneficial for the brain fog and other symptoms of menopause.   Individual Medical History/ Review of Systems: Changes? :Yes     See HPI  Past medications for mental health diagnoses include: Ambien caused amnesia, Lunesta , Wellbutrin , Xanax , Melatonin causes h/a, Mirtazapine  Allergies: Penicillins, Clindamycin, Codeine, Contrast media [iodinated contrast media], Lisinopril , Ace inhibitors, Hydrocodone -acetaminophen , Oxycodone -acetaminophen , Doxycycline , Erythromycin, Iohexol , and Neomycin  Current Medications:  Current Outpatient Medications:    albuterol  (VENTOLIN  HFA) 108 (90 Base) MCG/ACT inhaler, Inhale 2 puffs into the lungs every 6 (six) hours as needed for wheezing or shortness of breath., Disp: 18 g, Rfl: 0   azelastine (ASTELIN) 0.1 % nasal spray, Place 1 spray into both nostrils in the morning. Use in each nostril as directed, Disp: , Rfl:    Calcium -Vitamin  D-Vitamin K (CALCIUM  + D + K PO), Take 1 tablet by mouth daily., Disp: , Rfl:    cetirizine (ZYRTEC) 10 MG tablet, Take 10 mg by mouth at bedtime., Disp: , Rfl:    EPINEPHrine  0.3 mg/0.3 mL IJ SOAJ injection, Inject 0.3 mg into the muscle as needed for anaphylaxis., Disp: , Rfl:    FLOVENT HFA 44 MCG/ACT inhaler, Inhale 2 puffs into the lungs 2 (two) times daily., Disp: , Rfl:    fluticasone (FLONASE SENSIMIST) 27.5 MCG/SPRAY nasal spray, Place 1 spray into the nose daily., Disp: , Rfl:    L-Lysine HCl 1000 MG TABS, Take 1,000 mg by mouth daily as needed (Mouth sores)., Disp: , Rfl:    melatonin 1 MG TABS tablet, Take 1-3 mg by mouth at bedtime as needed., Disp: , Rfl:    naproxen  sodium (ALEVE ) 220 MG tablet, Take 220 mg by mouth daily as needed (Pain)., Disp: , Rfl:    rosuvastatin  (CRESTOR ) 10 MG tablet, Take 1 tablet (10 mg total) by mouth daily., Disp: 90 tablet, Rfl: 3   Spacer/Aero-Holding Chambers (OPTICHAMBER DIAMOND) MISC, See admin instructions., Disp: , Rfl:    valsartan -hydrochlorothiazide  (DIOVAN -HCT) 160-25 MG tablet, TAKE 1 TABLET BY MOUTH EVERY DAY, Disp: 90 tablet, Rfl: 1   ALPRAZolam  (XANAX ) 0.25 MG tablet, Take 0.5-2 tablets (0.125-0.5 mg total) by mouth 2 (two) times daily as needed for anxiety., Disp: 180 tablet, Rfl: 1   benzonatate  (TESSALON ) 100 MG capsule, Take 1 capsule (100 mg total) by mouth 2 (two) times daily as needed for cough. (Patient not taking: Reported on 03/21/2024), Disp:  20 capsule, Rfl: 0   buPROPion  (WELLBUTRIN  XL) 300 MG 24 hr tablet, Take 1 tablet (300 mg total) by mouth daily., Disp: 90 tablet, Rfl: 1   Eszopiclone  3 MG TABS, Take 1 tablet (3 mg total) by mouth at bedtime as needed. Take immediately before bedtime, Disp: 90 tablet, Rfl: 1   NONFORMULARY OR COMPOUNDED ITEM, Estradiol  cream 0.02%/ml with testosterone 2mg /ml in 1ml prefilled syringes.  1ml pv three times.  #83month supply/1RF, Disp: , Rfl:    valACYclovir (VALTREX) 500 MG tablet, Take 1,000  mg by mouth 3 (three) times daily as needed. , Disp: , Rfl:  Medication Side Effects: none  Family Medical/ Social History: Changes?  One of her sons is going to [place] next semester. And the other son has a role in [employer] tv show. They're both very successful and doing well in all areas of life.   MENTAL HEALTH EXAM:  There were no vitals taken for this visit.There is no height or weight on file to calculate BMI.  General Appearance: Casual, Neat and Well Groomed  Eye Contact:  Good  Speech:  Clear and Coherent  Volume:  Normal  Mood:  Euthymic  Affect:  Appropriate  Thought Process:  Goal Directed and Descriptions of Associations: Circumstantial  Orientation:  Full (Time, Place, and Person)  Thought Content: Logical   Suicidal Thoughts:  No  Homicidal Thoughts:  No  Memory:  WNL  Judgement:  Good  Insight:  Good  Psychomotor Activity:  Normal  Concentration:  Concentration: Good and Attention Span: Fair  Recall:  Good  Fund of Knowledge: Good  Language: Good  Assets:  Communication Skills Desire for Improvement Financial Resources/Insurance Housing Physical Health Resilience Social Support Transportation Vocational/Educational  ADL's:  Intact  Cognition: WNL  Prognosis:  Good   DIAGNOSES:    ICD-10-CM   1. Recurrent major depressive disorder, in full remission  F33.42     2. Generalized anxiety disorder  F41.1     3. Insomnia, unspecified type  G47.00       Receiving Psychotherapy: No      RECOMMENDATIONS:  PDMP was reviewed.  Last Xanax  was filled 04/19/2024.  Lunesta  filled 03/12/2024. I provided approximately   20 minutes of face to face time during this encounter, including time spent before and after the visit in records review, medical decision making, counseling pertinent to today's visit, and charting.   From a mental health medication standpoint she is doing well so no changes need to be made.  We discussed the HRT.  She will talk with her  GYN at appointment in January to get more current data, but my understanding is that it is only contraindicated in a person with known estrogen fed breast cancer.  There was data that came out 15 or 20 years ago stating estrogen replacement could cause cardiovascular problems but I am not up to date on more recent recommendations.  Continue Xanax  0.25 mg, 1/2-1 twice daily as needed.   Continue Wellbutrin  XL 300 mg 1 p.o. daily. Continue Lunesta  3 mg, 1 p.o. nightly. Continue melatonin 1 to 3 mg nightly as needed. Continue healthy lifestyle choices.  Continue counseling. Return in 6 months.  Verneita Cooks, PA-C

## 2024-05-20 ENCOUNTER — Ambulatory Visit: Attending: Obstetrics & Gynecology

## 2024-05-20 DIAGNOSIS — M62838 Other muscle spasm: Secondary | ICD-10-CM | POA: Insufficient documentation

## 2024-05-20 DIAGNOSIS — M6281 Muscle weakness (generalized): Secondary | ICD-10-CM | POA: Insufficient documentation

## 2024-05-20 DIAGNOSIS — R102 Pelvic and perineal pain unspecified side: Secondary | ICD-10-CM | POA: Diagnosis present

## 2024-05-20 DIAGNOSIS — R278 Other lack of coordination: Secondary | ICD-10-CM | POA: Diagnosis present

## 2024-05-20 DIAGNOSIS — N941 Unspecified dyspareunia: Secondary | ICD-10-CM | POA: Diagnosis present

## 2024-05-20 NOTE — Therapy (Signed)
 " OUTPATIENT PHYSICAL THERAPY FEMALE PELVIC TREATMENT   Patient Name: Shelley Figueroa MRN: [id number] DOB:26-Nov-1975, 48 y.o., female Today's Date: 05/20/2024  END OF SESSION:  PT End of Session - 05/20/24 1323     Visit Number 14    Date for Recertification  09/02/24    Authorization Type [employer] 10% coinsurance    PT Start Time 1322    PT Stop Time 1400    PT Time Calculation (min) 38 min    Activity Tolerance Patient tolerated treatment well    Behavior During Therapy WFL for tasks assessed/performed           Past Medical History:  Diagnosis Date   Allergic rhinitis due to pollen    Allergy 1989   Anemia    with pregnancy   Anxiety    Arthritis    Asthma    Chronic cystitis    COVID-19 virus infection 05/12/2021   Depression    Dyspareunia in female    Dyspnea on exertion    per pt hard to recover after exercise (cardiologist has echo and cardiac CT ordered when schedule is opened up)   Elevated LFTs 08/16/2022   resolved   Essential hypertension    cardioloigst-  dr t. raford-- FH premature CAD--- 08-18-2014 normal stress echo   Foot pain 02/15/2022   History of chronic bronchitis    History of HPV infection    remote   History of kidney stones    History of recurrent UTIs    OA (osteoarthritis)    b. hands and b. feet   Pure hypercholesterolemia 09/20/2018   Renal calculus, left    per pt non-obstructive   Past Surgical History:  Procedure Laterality Date   CESAREAN SECTION  2004, 2005   CYSTOSCOPY N/A 11/06/2018   Procedure: CYSTOSCOPY;  Surgeon: Cleotilde Ronal RAMAN, MD;  Location: [facility] [place];  Service: Gynecology;  Laterality: N/A;   HYSTEROSCOPY WITH D & C N/A 04/27/2022   Procedure: DILATATION AND CURETTAGE /HYSTEROSCOPY/MYOSURE POLYP RESECTION;  Surgeon: Cleotilde Ronal RAMAN, MD;  Location: [facility];  Service: Gynecology;  Laterality: N/A;   INTRAUTERINE DEVICE (IUD) INSERTION N/A 11/06/2018   Procedure: INTRAUTERINE  DEVICE (IUD) INSERTION WITH ULTRASOUND GUIDANCE;  Surgeon: Cleotilde Ronal RAMAN, MD;  Location: [facility] [place];  Service: Gynecology;  Laterality: N/A;   INTRAUTERINE DEVICE (IUD) INSERTION N/A 04/27/2022   Procedure: INTRAUTERINE DEVICE (IUD) INSERTION;  Surgeon: Cleotilde Ronal RAMAN, MD;  Location: [facility] [place];  Service: Gynecology;  Laterality: N/A;   IUD REMOVAL N/A 11/06/2018   Procedure: INTRAUTERINE DEVICE (IUD) REMOVAL;  Surgeon: Cleotilde Ronal RAMAN, MD;  Location: [facility];  Service: Gynecology;  Laterality: N/A;  Mirena  IUD removal and reinsertion with ultrasound guidance.   IUD REMOVAL N/A 04/27/2022   Procedure: INTRAUTERINE DEVICE (IUD) REMOVAL;  Surgeon: Cleotilde Ronal RAMAN, MD;  Location: [facility];  Service: Gynecology;  Laterality: N/A;   KNEE ARTHROSCOPY Right 1995   with left knee realignment   KNEE SURGERY Left 1991   NASAL SINUS SURGERY  2015   wtih septoplasty   ORIF WRIST FRACTURE Right 07/18/2023   Procedure: OPEN REDUCTION INTERNAL FIXATION (ORIF) WRIST FRACTURE;  Surgeon: Beverley Evalene BIRCH, MD;  Location: [facility];  Service: Orthopedics;  Laterality: Right;   SEPTOPLASTY     TONSILLECTOMY  1987   Patient Active Problem List   Diagnosis Date Noted   Closed fracture of right distal radius 07/18/2023  Elevated LFTs 08/16/2022   Encounter for removal of intrauterine contraceptive device (IUD) 04/27/2022   Foot pain 02/15/2022   Joint pain in both hands 01/29/2021   Family history of coronary artery disease in father 07/30/2019   Contrast media allergy 07/30/2019   Insomnia 06/15/2018   Hyperlipidemia 04/06/2018   Encounter for annual general medical examination with abnormal findings in adult 12/28/2016   Asthma, chronic 05/28/2014   Essential hypertension 05/28/2014   ADJ DISORDER WITH MIXED ANXIETY & DEPRESSED MOOD 07/15/2009   Allergic rhinitis 07/06/2009    PCP: Gretta Comer POUR, NP  REFERRING PROVIDER: Cleotilde Ronal RAMAN, MD   REFERRING DIAG: N94.10 (ICD-10-CM) - Dyspareunia in female  THERAPY DIAG:  Muscle weakness (generalized)  Other lack of coordination  Pelvic pain  Other muscle spasm  Dyspareunia in female  Rationale for Evaluation and Treatment: Rehabilitation  ONSET DATE: entire life  SUBJECTIVE:                                                                                                                                                                                           SUBJECTIVE STATEMENT: Pt states that she sees Dr. Cleotilde on 06/04/23. She has been very regular with estrogen/testosterone cream. She continues to feel like this is helping; there are on average 2 days a month in which she is having more sexual desire. She is hoping that she is going to be on hormone therapy within the next month.   PAIN: 04/09/24 Are you having pain? Yes NPRS scale: 3-4/10 Pain location: Vaginal; low back  Pain type: burning, sharp, and throbbing Pain description: intermittent   Aggravating factors: intercourse, vaginal exams, tampon Relieving factors: no vaginal penetration or intimate activity  PRECAUTIONS: None  RED FLAGS: None   WEIGHT BEARING RESTRICTIONS: No  FALLS:  Has patient fallen in last 6 months? No  OCCUPATION: vice president of sales, works for home  ACTIVITY LEVEL : daily walks (1.5 miles), systems analyst 3x/week 45 minutes   PLOF: Independent  PATIENT GOALS: to have less painful intercourse with partner   PERTINENT HISTORY:  2 c-sections, chronic cystitis, anxiety, asthma, anxiety, multiple IUD placements/removals all very painful  BOWEL MOVEMENT: Pain with bowel movement: No Type of bowel movement:Frequency 1x/day and Strain lately more with pain medications, but the last 8 years she has been much better Fully empty rectum: Yes:   Leakage: No Pads: No Fiber supplement/laxative Yes   URINATION: Pain with urination: No Fully empty bladder: Yes:    Stream: Strong Urgency: Yes  Frequency: every couple of hours Leakage: Laughing - occasional  Pads: No  INTERCOURSE:  Ability to  have vaginal penetration Yes  Pain with intercourse: Initial Penetration, During Penetration, Deep Penetration, and After Intercourse DrynessYes - feels like she will get wet and aroused 1x/month Climax: able to have orgasm, but only with external stimulation Marinoff Scale: 2/3 Lubricant: using a lot of lubricant  PREGNANCY: Vaginal deliveries 0 Tearing No Episiotomy No C-section deliveries 2 Currently pregnant No  PROLAPSE: None   OBJECTIVE:  Note: Objective measures were completed at Evaluation unless otherwise noted. 03/18/24 Difficulty with pelvic floor muscle relaxation - decreased ability to perform pelvic floor muscle relaxation, but better after contraction  09/04/23 Abdominal: significant tightness in Rt lower quadrant up to umbilical level                External Perineal Exam: WNL                             Internal Pelvic Floor: tenderness and tightness throughout; more tightness present on Lt compared to Rt; different sensations of uncomfortable pressure and burning - with prolonged pressure burning would start  Patient confirms identification and approves PT to assess internal pelvic floor and treatment Yes  PELVIC MMT:   MMT eval  Vaginal 4/5, 10 second hold, 12 repeats  Diastasis Recti 1.5 finger separation with distortion   (Blank rows = not tested)        TONE: high  PROLAPSE: Mild anterior/posterior vaginal wall laxity   08/28/23:   PATIENT SURVEYS:   PFIQ-7: 43  COGNITION: Overall cognitive status: Within functional limits for tasks assessed     SENSATION: Light touch: Appears intact  FUNCTIONAL TESTS: Squat: WNL Single leg stance:  Rt: pelvic drop  Lt: pelvic drop Curl-up test: deferred   GAIT: Assistive device utilized: None Comments: WNL  POSTURE: rounded shoulders, forward head, decreased  lumbar lordosis, increased thoracic kyphosis, and posterior pelvic tilt   LUMBARAROM/PROM: WNL   PALPATION:   General: WNL  Pelvic Alignment: deferred    TODAY'S TREATMENT:                                                                                                                              DATE:  05/20/24 Therapeutic activities: Pt education on dilator, wand, and vibrator use - she wants to hold off on these things until she starts using creams more and starts on hormone therapy  She would like to start back internal work in February once she has a plan for hormone replacement therapy Pt education on vibrator use for improved sensation, circulation, and ease of orgasm   04/24/24 Manual:  Neuromuscular re-education:  Exercises:  Therapeutic activities: Pt education on review of all lifestyle changes HEP review Tracking cycles and when pain increases during intercourse   04/09/24 Manual: Prone Negative pressure soft tissue mobilization to low back Trigger point release to lumbar paraspinals and bil glutes Soft tissue mobilization to low back Therapeutic activities: Review of HEP including  mindfulness and active awareness of pelvic floor muscle relaxation throughout the day    PATIENT EDUCATION:  Education details: See above Person educated: Patient Education method: Explanation, Demonstration, Tactile cues, Verbal cues, and Handouts Education comprehension: verbalized understanding  HOME EXERCISE PROGRAM: [phone]  ASSESSMENT:  CLINICAL IMPRESSION: Pt states that she continues to see improvement with use of estrogen/testosterone cream and she feels like her arousal is also improving slightly. She is really hoping that she can start taking systemic hormone replacement therapy after her next appointment on 06/04/23. She reports that her vaginal discomfort is still too high to consider using dilators or the wand at this point, but she would like to make this  a goal for starting again in February. Due to this, we discussed using vibrator instead and using it all over vulva as well as placing at intoritus to help improve circulation and desensitization; she was agreeable to this. She will continue to benefit from skilled PT intervention in order to improve pain with intercourse, improve discomfort in vulvar vaginal tissues, and improve quality of life.   OBJECTIVE IMPAIRMENTS: decreased activity tolerance, decreased coordination, decreased endurance, decreased mobility, decreased strength, increased muscle spasms, impaired tone, postural dysfunction, and pain.   ACTIVITY LIMITATIONS: vaginal penetration  PARTICIPATION LIMITATIONS: interpersonal relationship and gynecological exams   PERSONAL FACTORS: 3+ comorbidities: medical history are also affecting patient's functional outcome.   REHAB POTENTIAL: Fair chronic dyspareunia and asexual preference  CLINICAL DECISION MAKING: Evolving/moderate complexity  EVALUATION COMPLEXITY: Moderate   GOALS: Goals reviewed with patient? Yes  SHORT TERM GOALS: Updated 03/18/24   Pt will be independent with HEP.   Baseline: Goal status: MET 10/19/23  2.  Pt will report 25% improvement in pain with vaginal penetration.  Baseline: 15% better Goal status: MET 04/24/24  3.  Pt will begin using dilators at least 3x/week in order to start vaginal desensitization program on her own.  Baseline: she is not using dilators right now - we are using pelvic floor wand instead currently Goal status: IN PROGRESS 04/24/24  4.  Pt will perform dialy vaginal moisturization.  Baseline: performing on regular basis Goal status: MET 03/18/24  5.  Pt will be independent with diaphragmatic breathing and down training activities in order to improve pelvic floor relaxation.  Baseline:  Goal status: MET 01/30/24   LONG TERM GOALS: Updated 03/18/24  Pt will be independent with advanced HEP.   Baseline:  Goal status: IN  PROGRESS 04/24/24  2.  Pt will report 50% improvement in pain with vaginal penetration. Baseline: she feels 25% better Goal status: IN PROGRESS 04/24/24  3.  Pt will be independent with dilator activities and progression to larger sizes.  Baseline:  Goal status: IN PROGRESS 04/24/24  4.  Pt will demonstrate normal pelvic floor muscle A/ROM and tissue quality.  Baseline: still demonstrating increased tone in pelvic floor Goal status: IN PROGRESS 04/24/24  5.  Pt will be independent with vagal toning activities for improved down training.  Baseline:  Goal status: MET 03/18/24   PLAN:  PT FREQUENCY: 1-2x/week  PT DURATION: 6 months  PLANNED INTERVENTIONS: 97110-Therapeutic exercises, 97530- Therapeutic activity, 97112- Neuromuscular re-education, 97535- Self Care, [id number]- Manual therapy, Dry Needling, and Biofeedback  PLAN FOR NEXT SESSION: continue to perform pelvic floor muscle desensitization   Josette Mares, PT, DPT12/22/20252:11 PM   "

## 2024-06-03 ENCOUNTER — Encounter (HOSPITAL_BASED_OUTPATIENT_CLINIC_OR_DEPARTMENT_OTHER): Payer: Self-pay | Admitting: Obstetrics & Gynecology

## 2024-06-03 ENCOUNTER — Other Ambulatory Visit (HOSPITAL_COMMUNITY)
Admission: RE | Admit: 2024-06-03 | Discharge: 2024-06-03 | Disposition: A | Discharge status: Home / Self Care | Source: Ambulatory Visit | Attending: Obstetrics & Gynecology | Admitting: Obstetrics & Gynecology

## 2024-06-03 ENCOUNTER — Ambulatory Visit (HOSPITAL_BASED_OUTPATIENT_CLINIC_OR_DEPARTMENT_OTHER): Admitting: Obstetrics & Gynecology

## 2024-06-03 VITALS — BP 135/91 | HR 82 | Ht 65.5 in | Wt 147.6 lb

## 2024-06-03 DIAGNOSIS — Z1151 Encounter for screening for human papillomavirus (HPV): Secondary | ICD-10-CM | POA: Diagnosis not present

## 2024-06-03 DIAGNOSIS — N951 Menopausal and female climacteric states: Secondary | ICD-10-CM | POA: Diagnosis not present

## 2024-06-03 DIAGNOSIS — L723 Sebaceous cyst: Secondary | ICD-10-CM | POA: Diagnosis not present

## 2024-06-03 DIAGNOSIS — Z1331 Encounter for screening for depression: Secondary | ICD-10-CM | POA: Diagnosis not present

## 2024-06-03 DIAGNOSIS — R6882 Decreased libido: Secondary | ICD-10-CM

## 2024-06-03 DIAGNOSIS — Z124 Encounter for screening for malignant neoplasm of cervix: Secondary | ICD-10-CM | POA: Insufficient documentation

## 2024-06-03 DIAGNOSIS — N926 Irregular menstruation, unspecified: Secondary | ICD-10-CM

## 2024-06-03 DIAGNOSIS — Z01419 Encounter for gynecological examination (general) (routine) without abnormal findings: Secondary | ICD-10-CM

## 2024-06-03 DIAGNOSIS — Z975 Presence of (intrauterine) contraceptive device: Secondary | ICD-10-CM

## 2024-06-03 MED ORDER — ESTRADIOL 0.05 MG/24HR TD PTTW: 1.0000 | MEDICATED_PATCH | TRANSDERMAL | 2 refills | Status: AC

## 2024-06-03 NOTE — Progress Notes (Signed)
 "  ANNUAL EXAM Patient name: Shelley Figueroa MRN [id number]  Date of birth: 01/04/1976 Chief Complaint:   Annual Exam (Wants to talk about perimenapausal symptoms)  History of Present Illness:   Shelley Figueroa is a 49 y.o. G69P1102 Caucasian female being seen today for a routine annual exam.  Son just left for a semester in [place].  She is going to be able to visit at the end of the semester.    Having more menopausal symptoms including vaginal dryness, brain fog, mood changes.  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  She is using vaginal estradiol  cream with topical testosterone .   She is exercising regularly.  Has lost weight.  Did start a statin this year.  This is familial.    She is having some more issues with urinary urgency or feeling like she is going to have a UTI.  She did have some imaging and she has a few new stones.    No LMP recorded. (Menstrual status: IUD).  Last pap 03/31/2021. Results were: NILM w/ HRHPV negative. H/O abnormal pap: yes Last mammogram: 08/15/2023. Results were: normal. Family h/o breast cancer: no Last colonoscopy: 03/05/2020. Results were: normal. Family h/o colorectal cancer: no.  Follow up 10 years.       06/03/2024    2:39 PM 06/06/2023    8:23 AM 02/08/2023    9:31 AM 06/01/2022   10:29 AM 04/08/2022    9:15 AM  Depression screen PHQ 2/9  Decreased Interest 1 0 1 0 0  Down, Depressed, Hopeless 1 0 1 0 0  PHQ - 2 Score 2 0 2 0 0  Altered sleeping 2  0    Tired, decreased energy 2  1    Change in appetite 0  0    Feeling bad or failure about yourself  1  0    Trouble concentrating 2  0    Moving slowly or fidgety/restless 2  0    Suicidal thoughts 0  0    PHQ-9 Score 11  3     Difficult doing work/chores Somewhat difficult  Not difficult at all       Data saved with a previous flowsheet row definition   Review of Systems:   Pertinent items are noted in HPI Denies bowel changes or pelvic pain.   Pertinent  History Reviewed:  Reviewed past medical,surgical, social and family history.  Reviewed problem list, medications and allergies. Physical Assessment:   Vitals:   06/03/24 1434  BP: (!) 135/91  Pulse: 82  Weight: 147 lb 9.6 oz (67 kg)  Height: 5' 5.5 (1.664 m)  Body mass index is 24.19 kg/m.        Physical Examination:   General appearance - well appearing, and in no distress  Mental status - alert, oriented to person, place, and time  Psych:  She has a normal mood and affect  Skin - warm and dry, normal color, no suspicious lesions noted  Chest - effort normal, all lung fields clear to auscultation bilaterally  Heart - normal rate and regular rhythm  Neck:  midline trachea, no thyromegaly or nodules  Breasts - breasts appear normal, no suspicious masses, no skin or nipple changes or  axillary nodes  Abdomen - soft, nontender, nondistended, no masses or organomegaly  Pelvic - VULVA: normal appearing vulva with no masses, tenderness or lesions   VAGINA: normal appearing vagina with normal color and discharge, no lesions  CERVIX: normal appearing cervix without discharge or lesions, no CMT, IUD string noted, small endocervical polyp noted today  Thin prep pap is updated today HR HPV cotesting  UTERUS: uterus is felt to be normal size, shape, consistency and nontender   ADNEXA: No adnexal masses or tenderness noted.  Rectal - normal rectal, good sphincter tone, perianal sebacous cyst present 11 -12 o'clock  Extremities:  No swelling or varicosities noted  Chaperone present for exam  No results found for this or any previous visit (from the past 24 hours).  Assessment & Plan:  1. Well woman exam with routine gynecological exam (Primary) - Pap smear 03/31/2021 - Mammogram 07/30/2023 - Colonoscopy 03/05/2020 - lab work done with PCP, Dr. Annabella Scarce - vaccines reviewed/updated  2. Irregular bleeding - US  PELVIS TRANSVAGINAL NON-OB (TV ONLY); Future - return in 4 weeks for  recheck and ultrasound  3. Decreased libido - Testosterone , total, level obtained today  4. Perimenopausal - estradiol  (VIVELLE -DOT) 0.05 MG/24HR patch; Place 1 patch (0.05 mg total) onto the skin 2 (two) times a week.  Dispense: 8 patch; Refill: 2  6. Sebaceous cyst - pt would like lesion removed this year - Ambulatory referral to General Surgery    Orders Placed This Encounter  Procedures   US  PELVIS TRANSVAGINAL NON-OB (TV ONLY)   Testosterone     Meds:  Meds ordered this encounter  Medications   estradiol  (VIVELLE -DOT) 0.05 MG/24HR patch    Sig: Place 1 patch (0.05 mg total) onto the skin 2 (two) times a week.    Dispense:  8 patch    Refill:  2    Follow-up: Return in about 4 weeks (around 07/01/2024).  Ronal GORMAN Pinal, MD 06/03/2024 3:18 PM  "

## 2024-06-04 LAB — TESTOSTERONE: Testosterone: <3 ng/dL — ABNORMAL LOW (ref 4–50)

## 2024-06-06 LAB — CYTOLOGY - PAP
Comment: NEGATIVE
Diagnosis: NEGATIVE
High risk HPV: NEGATIVE

## 2024-06-10 ENCOUNTER — Encounter (HOSPITAL_BASED_OUTPATIENT_CLINIC_OR_DEPARTMENT_OTHER): Payer: Self-pay | Admitting: Obstetrics & Gynecology

## 2024-06-17 ENCOUNTER — Ambulatory Visit: Attending: Obstetrics & Gynecology

## 2024-06-17 DIAGNOSIS — M62838 Other muscle spasm: Secondary | ICD-10-CM | POA: Diagnosis present

## 2024-06-17 DIAGNOSIS — R278 Other lack of coordination: Secondary | ICD-10-CM | POA: Insufficient documentation

## 2024-06-17 DIAGNOSIS — M6281 Muscle weakness (generalized): Secondary | ICD-10-CM | POA: Insufficient documentation

## 2024-06-17 DIAGNOSIS — R102 Pelvic and perineal pain unspecified side: Secondary | ICD-10-CM | POA: Diagnosis present

## 2024-06-17 DIAGNOSIS — N941 Unspecified dyspareunia: Secondary | ICD-10-CM | POA: Insufficient documentation

## 2024-06-17 NOTE — Patient Instructions (Signed)
  Lubrication Used for intercourse to reduce friction Avoid ones that have glycerin, nonoxynol-9, petroleum, propylene glycol, chlorhexidine gluconate, warming gels, tingling gels, icing or cooling gel, scented Avoid parabens due to a preservative similar to female sex hormone May need to be reapplied once or several times during sexual activity Can be applied to both partners genitals prior to vaginal penetration to minimize friction or irritation Prevent irritation and mucosal tears that cause post coital pain and increased the risk of vaginal and urinary tract infections Oil-based lubricants cannot be used with condoms due to breaking them down.  Least likely to irritate vaginal tissue.  Plant based-lubes are safe Silicone-based lubrication are thicker and last long and used for post-menopausal women  Vaginal Lubricators Here is a list of some suggested lubricators you can use for intercourse. Use the most hypoallergenic product.  You can place on you or your partner.  Slippery Stuff ( water based) Sylk or Sliquid Natural H2O ( good  if frequent UTI's)- [employer], amazon Sliquid organics silk-(aloe and silicone based ) [employer] (www.blossom-organics.com)- (aloe based ) Coconut oil, olive oil -not good with condoms  PJur Woman Nude- (water based) amazon Uberlube- ( silicon) Amazon Aloe Vera- Sprouts has an organic one Yes lubricant- (water based and has plant oil based similar to silicone) [employer] Platinum-Silicone, [employer], [facility] Olive and Bee intimate cream-  www.oliveandbee.com.au Pink - [employer] Erosense Sync- walmart, amazon Coconu- coconu.com [employer] Good Clean Love lubricants  Things to avoid in lubricants are glycerin, warming gels, tingling gels, icing or cooling  gels, and scented gels.  Also avoid Vaseline. KY jelly,  and Astroglide contain chlorhexidine which kills good bacteria(lactobacilli)  Things to avoid in the vaginal area Do not use  things to irritate the vulvar area No lotions- see below Soaps you  can use :Aveeno, [place], Good Clean Love cleanser if needed. Must be gentle No deodorants No douches Good to sleep without underwear to let the vaginal area to air out No scrubbing: spread the lips to let warm water rinse over labias and pat dry  Creams that can be used on the Vulva Area V [employer], walmart Vital V [place] Salve Julva- [employer] Botanical Pro-Meno [place] Cream Coconut oil, olive oil Cleo by [employer] labial moisturizer -Amazon,  Desert [place] Releveum ( lidocaine) or Desert [employer] Yes Moisturizer

## 2024-06-17 NOTE — Therapy (Signed)
 " OUTPATIENT PHYSICAL THERAPY FEMALE PELVIC TREATMENT   Patient Name: Shelley Figueroa MRN: [id number] DOB:1976-01-01, 49 y.o., female Today's Date: 06/17/2024  END OF SESSION:  PT End of Session - 06/17/24 1240     Visit Number 15    Date for Recertification  09/02/24    Authorization Type [employer] 10% coinsurance    PT Start Time 1231    PT Stop Time 1310    PT Time Calculation (min) 39 min    Activity Tolerance Patient tolerated treatment well    Behavior During Therapy [facility] for tasks assessed/performed           Past Medical History:  Diagnosis Date   Allergic rhinitis due to pollen    Allergy 1989   Anemia    with pregnancy   Anxiety    Arthritis    Asthma    Chronic cystitis    COVID-19 virus infection 05/12/2021   Depression    Dyspareunia in female    Dyspnea on exertion    per pt hard to recover after exercise (cardiologist has echo and cardiac CT ordered when schedule is opened up)   Elevated LFTs 08/16/2022   resolved   Essential hypertension    cardioloigst-  dr t. raford-- FH premature CAD--- 08-18-2014 normal stress echo   Foot pain 02/15/2022   History of chronic bronchitis    History of HPV infection    remote   History of kidney stones    History of recurrent UTIs    OA (osteoarthritis)    b. hands and b. feet   Pure hypercholesterolemia 09/20/2018   Renal calculus, left    per pt non-obstructive   Past Surgical History:  Procedure Laterality Date   CESAREAN SECTION  2004, 2005   CYSTOSCOPY N/A 11/06/2018   Procedure: CYSTOSCOPY;  Surgeon: Cleotilde Ronal RAMAN, MD;  Location: [facility];  Service: Gynecology;  Laterality: N/A;   HYSTEROSCOPY WITH D & C N/A 04/27/2022   Procedure: DILATATION AND CURETTAGE /HYSTEROSCOPY/MYOSURE POLYP RESECTION;  Surgeon: Cleotilde Ronal RAMAN, MD;  Location: [facility];  Service: Gynecology;  Laterality: N/A;   INTRAUTERINE DEVICE (IUD) INSERTION N/A 11/06/2018   Procedure: INTRAUTERINE  DEVICE (IUD) INSERTION WITH ULTRASOUND GUIDANCE;  Surgeon: Cleotilde Ronal RAMAN, MD;  Location: [facility];  Service: Gynecology;  Laterality: N/A;   INTRAUTERINE DEVICE (IUD) INSERTION N/A 04/27/2022   Procedure: INTRAUTERINE DEVICE (IUD) INSERTION;  Surgeon: Cleotilde Ronal RAMAN, MD;  Location: [facility];  Service: Gynecology;  Laterality: N/A;   IUD REMOVAL N/A 11/06/2018   Procedure: INTRAUTERINE DEVICE (IUD) REMOVAL;  Surgeon: Cleotilde Ronal RAMAN, MD;  Location: [facility];  Service: Gynecology;  Laterality: N/A;  Mirena  IUD removal and reinsertion with ultrasound guidance.   IUD REMOVAL N/A 04/27/2022   Procedure: INTRAUTERINE DEVICE (IUD) REMOVAL;  Surgeon: Cleotilde Ronal RAMAN, MD;  Location: [facility];  Service: Gynecology;  Laterality: N/A;   KNEE ARTHROSCOPY Right 1995   with left knee realignment   KNEE SURGERY Left 1991   NASAL SINUS SURGERY  2015   wtih septoplasty   ORIF WRIST FRACTURE Right 07/18/2023   Procedure: OPEN REDUCTION INTERNAL FIXATION (ORIF) WRIST FRACTURE;  Surgeon: Beverley Evalene BIRCH, MD;  Location: [facility];  Service: Orthopedics;  Laterality: Right;   SEPTOPLASTY     TONSILLECTOMY  1987   Patient Active Problem List   Diagnosis Date Noted   Closed fracture of right distal radius 07/18/2023  Elevated LFTs 08/16/2022   Encounter for removal of intrauterine contraceptive device (IUD) 04/27/2022   Foot pain 02/15/2022   Joint pain in both hands 01/29/2021   Family history of coronary artery disease in father 07/30/2019   Contrast media allergy 07/30/2019   Insomnia 06/15/2018   Hyperlipidemia 04/06/2018   Encounter for annual general medical examination with abnormal findings in adult 12/28/2016   Asthma, chronic 05/28/2014   Essential hypertension 05/28/2014   ADJ DISORDER WITH MIXED ANXIETY & DEPRESSED MOOD 07/15/2009   Allergic rhinitis 07/06/2009    PCP: Gretta Comer POUR, NP  REFERRING PROVIDER: Cleotilde Ronal RAMAN, MD   REFERRING DIAG: N94.10 (ICD-10-CM) - Dyspareunia in female  THERAPY DIAG:  Muscle weakness (generalized)  Other lack of coordination  Pelvic pain  Other muscle spasm  Dyspareunia in female  Rationale for Evaluation and Treatment: Rehabilitation  ONSET DATE: entire life  SUBJECTIVE:                                                                                                                                                                                           SUBJECTIVE STATEMENT: Pt has started an estrogen patch and is going to have an ultrasound at the end of the month. She has been tested for testosterone  and it was very low and she is going to start supplementing more than just vaginally. 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 Her desire is increasing and she feels arousal of up to 1-2x/week where she wants to have an orgasm. She did have one very painful day with traveling, but feels like she will typically still have one painful day a week; but she is going to start using lidocaine  cream on these days. Intercourse is still uncomfortable.   PAIN: 04/09/24 Are you having pain? Yes NPRS scale: 3-4/10 Pain location: Vaginal; low back  Pain type: burning, sharp, and throbbing Pain description: intermittent   Aggravating factors: intercourse, vaginal exams, tampon Relieving factors: no vaginal penetration or intimate activity  PRECAUTIONS: None  RED FLAGS: None   WEIGHT BEARING RESTRICTIONS: No  FALLS:  Has patient fallen in last 6 months? No  OCCUPATION: vice president of sales, works for home  ACTIVITY LEVEL : daily walks (1.5 miles), systems analyst 3x/week 45 minutes   PLOF: Independent  PATIENT GOALS: to have less painful intercourse with partner   PERTINENT HISTORY:  2 c-sections, chronic cystitis, anxiety, asthma, anxiety, multiple IUD placements/removals all very painful  BOWEL MOVEMENT: Pain with bowel movement:  No Type of bowel movement:Frequency 1x/day and Strain lately more with pain medications, but the last 8 years she has been  much better Fully empty rectum: Yes:   Leakage: No Pads: No Fiber supplement/laxative Yes   URINATION: Pain with urination: No Fully empty bladder: Yes:   Stream: Strong Urgency: Yes  Frequency: every couple of hours Leakage: Laughing - occasional  Pads: No  INTERCOURSE:  Ability to have vaginal penetration Yes  Pain with intercourse: Initial Penetration, During Penetration, Deep Penetration, and After Intercourse DrynessYes - feels like she will get wet and aroused 1x/month Climax: able to have orgasm, but only with external stimulation Marinoff Scale: 2/3 Lubricant: using a lot of lubricant  PREGNANCY: Vaginal deliveries 0 Tearing No Episiotomy No C-section deliveries 2 Currently pregnant No  PROLAPSE: None   OBJECTIVE:  Note: Objective measures were completed at Evaluation unless otherwise noted. 03/18/24 Difficulty with pelvic floor muscle relaxation - decreased ability to perform pelvic floor muscle relaxation, but better after contraction  09/04/23 Abdominal: significant tightness in Rt lower quadrant up to umbilical level                External Perineal Exam: WNL                             Internal Pelvic Floor: tenderness and tightness throughout; more tightness present on Lt compared to Rt; different sensations of uncomfortable pressure and burning - with prolonged pressure burning would start  Patient confirms identification and approves PT to assess internal pelvic floor and treatment Yes  PELVIC MMT:   MMT eval  Vaginal 4/5, 10 second hold, 12 repeats  Diastasis Recti 1.5 finger separation with distortion   (Blank rows = not tested)        TONE: high  PROLAPSE: Mild anterior/posterior vaginal wall laxity   08/28/23:   PATIENT SURVEYS:   PFIQ-7: 43  COGNITION: Overall cognitive status: Within functional limits for  tasks assessed     SENSATION: Light touch: Appears intact  FUNCTIONAL TESTS: Squat: WNL Single leg stance:  Rt: pelvic drop  Lt: pelvic drop Curl-up test: deferred   GAIT: Assistive device utilized: None Comments: WNL  POSTURE: rounded shoulders, forward head, decreased lumbar lordosis, increased thoracic kyphosis, and posterior pelvic tilt   LUMBARAROM/PROM: WNL   PALPATION:   General: WNL  Pelvic Alignment: deferred    TODAY'S TREATMENT:                                                                                                                              DATE:  06/17/2024 Neuromuscular re-education: Down training activities and nervous system regulation continued education - pt found yoga practice that is very helpful with nervous system regulation  Therapeutic activities: Review of current condition and most recent medical updates  Pt education and research for better personal lubricant products since she has tried so many without success    05/20/24 Therapeutic activities: Pt education on dilator, wand, and vibrator use - she wants to hold off on these things  until she starts using creams more and starts on hormone therapy  She would like to start back internal work in February once she has a plan for hormone replacement therapy Pt education on vibrator use for improved sensation, circulation, and ease of orgasm   04/24/24 Therapeutic activities: Pt education on review of all lifestyle changes HEP review Tracking cycles and when pain increases during intercourse   04/09/24 Manual: Prone Negative pressure soft tissue mobilization to low back Trigger point release to lumbar paraspinals and bil glutes Soft tissue mobilization to low back Therapeutic activities: Review of HEP including mindfulness and active awareness of pelvic floor muscle relaxation throughout the day    PATIENT EDUCATION:  Education details: See above Person educated:  Patient Education method: Explanation, Demonstration, Tactile cues, Verbal cues, and Handouts Education comprehension: verbalized understanding  HOME EXERCISE PROGRAM: [id number]  ASSESSMENT:  CLINICAL IMPRESSION: Pt is doing better with increased arousal and decreased pain. We are hoping that additional testosterone  and estrogen supplementation will be benficial in further improving things. She is going to get lidocaine  cream to use on her more painful days with vulvodynia. We researched together and discussed various lubricant options that were not silicone or oil based due to patient preference. She is going to possibly try olive oil and [employer] as options. She is doing well with nervous system regulation and has found a yoga class she feels like is beneficial. She will continue to benefit from skilled PT intervention in order to improve pain with intercourse, improve discomfort in vulvar vaginal tissues, and improve quality of life.   OBJECTIVE IMPAIRMENTS: decreased activity tolerance, decreased coordination, decreased endurance, decreased mobility, decreased strength, increased muscle spasms, impaired tone, postural dysfunction, and pain.   ACTIVITY LIMITATIONS: vaginal penetration  PARTICIPATION LIMITATIONS: interpersonal relationship and gynecological exams   PERSONAL FACTORS: 3+ comorbidities: medical history are also affecting patient's functional outcome.   REHAB POTENTIAL: Fair chronic dyspareunia and asexual preference  CLINICAL DECISION MAKING: Evolving/moderate complexity  EVALUATION COMPLEXITY: Moderate   GOALS: Goals reviewed with patient? Yes  SHORT TERM GOALS: Updated 03/18/24   Pt will be independent with HEP.   Baseline: Goal status: MET 10/19/23  2.  Pt will report 25% improvement in pain with vaginal penetration.  Baseline: 15% better Goal status: MET 04/24/24  3.  Pt will begin using dilators at least 3x/week in order to start vaginal  desensitization program on her own.  Baseline: she is not using dilators right now - we are using pelvic floor wand instead currently Goal status: IN PROGRESS 06/17/2024  4.  Pt will perform dialy vaginal moisturization.  Baseline: performing on regular basis Goal status: MET 03/18/24  5.  Pt will be independent with diaphragmatic breathing and down training activities in order to improve pelvic floor relaxation.  Baseline:  Goal status: MET 01/30/24   LONG TERM GOALS: Updated 03/18/24  Pt will be independent with advanced HEP.   Baseline:  Goal status: IN PROGRESS 06/17/2024  2.  Pt will report 50% improvement in pain with vaginal penetration. Baseline: she feels 25% better Goal status: IN PROGRESS 06/17/2024  3.  Pt will be independent with dilator activities and progression to larger sizes.  Baseline:  Goal status: IN PROGRESS 06/17/2024  4.  Pt will demonstrate normal pelvic floor muscle A/ROM and tissue quality.  Baseline: still demonstrating increased tone in pelvic floor Goal status: IN PROGRESS 06/17/2024  5.  Pt will be independent with vagal toning activities for improved down training.  Baseline:  Goal status: MET 03/18/24   PLAN:  PT FREQUENCY: 1-2x/week  PT DURATION: 6 months  PLANNED INTERVENTIONS: 97110-Therapeutic exercises, 97530- Therapeutic activity, 97112- Neuromuscular re-education, 97535- Self Care, [id number]- Manual therapy, Dry Needling, and Biofeedback  PLAN FOR NEXT SESSION: continue to perform pelvic floor muscle desensitization   Josette Mares, PT, DPT01/19/261:17 PM   "

## 2024-06-20 ENCOUNTER — Other Ambulatory Visit (HOSPITAL_BASED_OUTPATIENT_CLINIC_OR_DEPARTMENT_OTHER): Payer: Self-pay

## 2024-06-20 ENCOUNTER — Other Ambulatory Visit (HOSPITAL_BASED_OUTPATIENT_CLINIC_OR_DEPARTMENT_OTHER): Payer: Self-pay | Admitting: Obstetrics & Gynecology

## 2024-06-20 DIAGNOSIS — R7989 Other specified abnormal findings of blood chemistry: Secondary | ICD-10-CM

## 2024-06-20 DIAGNOSIS — R6882 Decreased libido: Secondary | ICD-10-CM

## 2024-06-20 MED ORDER — NONFORMULARY OR COMPOUNDED ITEM: 1 refills | Status: DC

## 2024-06-20 MED ORDER — NONFORMULARY OR COMPOUNDED ITEM: 1 refills | Status: AC

## 2024-06-20 MED ORDER — ESTRADIOL 0.01 % VA CREA: TOPICAL_CREAM | VAGINAL | 6 refills | Status: AC

## 2024-06-23 ENCOUNTER — Ambulatory Visit (HOSPITAL_BASED_OUTPATIENT_CLINIC_OR_DEPARTMENT_OTHER): Payer: Self-pay | Admitting: Obstetrics & Gynecology

## 2024-07-02 NOTE — Progress Notes (Unsigned)
" ° °  Ultrasound f/u Patient name: Shelley Figueroa MRN [id number]  Date of birth: 04/15/1976 Chief Complaint:   Office visit (Follow up ultrasound)  History of Present Illness:   Shelley Figueroa is a 49 y.o. [id number] Caucasian female being seen today for discussion of ultrasound findings that was done due to irregular spotting.    No LMP recorded. (Menstrual status: IUD).   Last pap 06/03/2024. Results were: NILM w/ HRHPV negative.   Review of Systems:   Pertinent items are noted in HPI Denies any urinary or bowel changes or pelvic pain*** Pertinent History Reviewed:  Reviewed past medical,surgical, social and family history.  Reviewed problem list, medications and allergies. Physical Assessment:   Vitals:   07/03/24 1438  BP: (!) 146/86  Pulse: 77  Weight: 156 lb 3.2 oz (70.9 kg)  Body mass index is 25.6 kg/m.        Physical Examination:   General appearance - well appearing, and in no distress  Mental status - alert, oriented to person, place, and time  Psych:  She has a normal mood and affect   Abdomen - soft, nontender, nondistended, no masses or organomegaly  Pelvic - VULVA: normal appearing vulva with no masses, tenderness or lesions   VAGINA: normal appearing vagina with normal color and discharge, no lesions   CERVIX: normal appearing cervix without discharge or lesions, no CMT  Thin prep pap is {Desc; done/not:10129} *** HR HPV cotesting  UTERUS: uterus is felt to be normal size, shape, consistency and nontender   ADNEXA: No adnexal masses or tenderness noted.  Rectal - normal rectal, good sphincter tone, no masses felt.  Assessment & Plan:  There are no diagnoses linked to this encounter.  No orders of the defined types were placed in this encounter.   Meds: No orders of the defined types were placed in this encounter.   Follow-up: No follow-ups on file.  Ronal GORMAN Pinal, MD 07/03/2024 3:37 PM GYNECOLOGY  VISIT    "

## 2024-07-03 ENCOUNTER — Encounter (HOSPITAL_BASED_OUTPATIENT_CLINIC_OR_DEPARTMENT_OTHER): Payer: Self-pay | Admitting: Obstetrics & Gynecology

## 2024-07-03 ENCOUNTER — Ambulatory Visit (HOSPITAL_BASED_OUTPATIENT_CLINIC_OR_DEPARTMENT_OTHER): Payer: Self-pay | Admitting: Obstetrics & Gynecology

## 2024-07-03 ENCOUNTER — Ambulatory Visit (HOSPITAL_BASED_OUTPATIENT_CLINIC_OR_DEPARTMENT_OTHER)

## 2024-07-03 DIAGNOSIS — N926 Irregular menstruation, unspecified: Secondary | ICD-10-CM

## 2024-07-22 ENCOUNTER — Ambulatory Visit

## 2024-08-06 ENCOUNTER — Other Ambulatory Visit (HOSPITAL_BASED_OUTPATIENT_CLINIC_OR_DEPARTMENT_OTHER): Payer: Self-pay

## 2024-08-09 ENCOUNTER — Ambulatory Visit: Admitting: Rheumatology

## 2024-08-14 ENCOUNTER — Ambulatory Visit: Attending: Obstetrics & Gynecology

## 2024-11-05 ENCOUNTER — Ambulatory Visit: Admitting: Physician Assistant
